# Patient Record
Sex: Male | Born: 1975 | Race: White | Hispanic: No | Marital: Married | Smoking: Never smoker
Health system: Southern US, Community
[De-identification: ages and names within clinical notes are randomized; demographics above are authoritative.]

## PROBLEM LIST (undated history)

## (undated) DIAGNOSIS — N200 Calculus of kidney: Secondary | ICD-10-CM

## (undated) DIAGNOSIS — E785 Hyperlipidemia, unspecified: Secondary | ICD-10-CM

## (undated) DIAGNOSIS — D239 Other benign neoplasm of skin, unspecified: Secondary | ICD-10-CM

## (undated) DIAGNOSIS — K8020 Calculus of gallbladder without cholecystitis without obstruction: Secondary | ICD-10-CM

## (undated) DIAGNOSIS — B019 Varicella without complication: Secondary | ICD-10-CM

## (undated) DIAGNOSIS — I10 Essential (primary) hypertension: Secondary | ICD-10-CM

## (undated) DIAGNOSIS — E119 Type 2 diabetes mellitus without complications: Secondary | ICD-10-CM

## (undated) HISTORY — DX: Essential (primary) hypertension: I10

## (undated) HISTORY — DX: Calculus of gallbladder without cholecystitis without obstruction: K80.20

## (undated) HISTORY — DX: Calculus of kidney: N20.0

## (undated) HISTORY — DX: Other benign neoplasm of skin, unspecified: D23.9

## (undated) HISTORY — DX: Varicella without complication: B01.9

## (undated) HISTORY — PX: NO PAST SURGERIES: SHX2092

## (undated) HISTORY — DX: Hyperlipidemia, unspecified: E78.5

---

## 2010-07-30 ENCOUNTER — Emergency Department: Payer: Self-pay | Admitting: Emergency Medicine

## 2010-08-06 ENCOUNTER — Encounter: Payer: Self-pay | Admitting: Cardiovascular Disease

## 2010-08-06 ENCOUNTER — Ambulatory Visit (INDEPENDENT_AMBULATORY_CARE_PROVIDER_SITE_OTHER): Payer: PRIVATE HEALTH INSURANCE | Admitting: Cardiovascular Disease

## 2010-08-06 VITALS — BP 156/99 | HR 79 | Ht 71.0 in | Wt 247.0 lb

## 2010-08-06 DIAGNOSIS — K219 Gastro-esophageal reflux disease without esophagitis: Secondary | ICD-10-CM

## 2010-08-06 DIAGNOSIS — I10 Essential (primary) hypertension: Secondary | ICD-10-CM

## 2010-08-06 NOTE — Assessment & Plan Note (Signed)
His symptoms are very consistent with gastroesophageal reflux. His pains definitely worsened when he ate greasy foods such as a sausage gravy or a sausage and a sandwich. He also eats lots of fatty foods. 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  He admits that getting up 5 pounds is going to be difficult.  I asked him to greatly diminished a greasy foods that he is eating. I've asked him to try olive oil.  I've also asked him to greatly cut down on his salt intake. I recommended that he start omeprazole or Prevacid which is available over-the-counter for his reflux. We'll see him again in several months.

## 2010-08-06 NOTE — Patient Instructions (Signed)
Try Prevacid or Prilosec ( omeprazole)   for acid reflux.  Avoid greasy foods. Avoid salt

## 2010-08-06 NOTE — Progress Notes (Signed)
Jerry Reid Date of Birth  Jun 04, 1975 [facility] [address]     [address] [place], [place]  [address] [phone]  Fax  [phone]  History of Present Illness:  Episode of chest pain recently - similar to heart burn.  Lasted for days.  Has tried Tums without any help.  Went to [facility]  ER this week.  Work up was normal.  Had palpitations.  Pains/ pressure has continued since that time.    No current outpatient prescriptions on file.   No Known Allergies  History reviewed. No pertinent past medical history.  History reviewed. No pertinent past surgical history.  History  Smoking status  . Never Smoker   Smokeless tobacco  . Not on file    History  Alcohol Use No    Family History  Problem Relation Age of Onset  . Heart attack Mother   . Heart disease Mother   . Hyperlipidemia Mother   . Hypertension Father   . Heart disease Father     Reviw of Systems:  Reviewed in the HPI.  All other systems are negative.  Physical Exam: BP 156/99  Pulse 79  Ht 5\' 11"  (1.803 m)  Wt 247 lb (112.038 kg)  BMI 34.45 kg/m2 The patient is alert and oriented x 3.  The mood and affect are normal.   Skin: warm and dry.  Color is normal.    HEENT:   the sclera are nonicteric.  The mucous membranes are moist.  The carotids are 2+ without bruits.  There is no thyromegaly.  There is no JVD.    Lungs: clear.  The chest wall is non tender.    Heart: regular rate with a normal S1 and S2.  There are no murmurs, gallops, or rubs. The PMI is not displaced.     Abdomin: good bowel sounds.  There is no guarding or rebound.  There is no hepatosplenomegaly or tenderness.  There are no masses.   Extremities:  no clubbing, cyanosis, or edema.  The legs are without rashes.  The distal pulses are intact.   Neuro:  Cranial nerves II - XII are intact.  Motor and sensory functions are intact.    The gait is normal.  ECG: Normal sinus rhythm. He has a  normal EKG.  Assessment / Plan:

## 2010-08-06 NOTE — Assessment & Plan Note (Signed)
I suspect that that is still eating a lot of salt. He eats a lot of fried foods. He goes out to eat several times a week. Avastin 2 cut down on the [employer] seeding. If his blood pressure remains elevated, we will start him on some low-dose HCTZ or other medications.  I'll see him again in 3 months for followup of his hypertension.

## 2010-08-12 ENCOUNTER — Encounter: Payer: Self-pay | Admitting: Cardiovascular Disease

## 2010-08-13 ENCOUNTER — Encounter: Payer: Self-pay | Admitting: Cardiovascular Disease

## 2010-11-13 ENCOUNTER — Ambulatory Visit: Payer: PRIVATE HEALTH INSURANCE | Admitting: Cardiovascular Disease

## 2015-05-01 ENCOUNTER — Ambulatory Visit: Payer: Self-pay | Admitting: Physician Assistant

## 2015-05-01 ENCOUNTER — Encounter: Payer: Self-pay | Admitting: Physician Assistant

## 2015-05-01 VITALS — BP 160/90 | HR 72 | Temp 98.5°F

## 2015-05-01 DIAGNOSIS — Z299 Encounter for prophylactic measures, unspecified: Secondary | ICD-10-CM

## 2015-05-01 DIAGNOSIS — R5383 Other fatigue: Secondary | ICD-10-CM

## 2015-05-01 DIAGNOSIS — R7309 Other abnormal glucose: Secondary | ICD-10-CM

## 2015-05-01 NOTE — Progress Notes (Signed)
S: was at a fair yesterday and had his glucose checked, had just eaten and his glucose was over 300, waited 2 hours and the lady checked it again and it was 220, has been feeling really tired, dragging, urinating a lot especially at night, seems to be thirsty at night, bp has been elevated, running around 140/90; has hx of borderline htn, low T, and prediabetes, no meds at this time, use to take clomid for low T; family hx of dm II, cardiovascular disease with both parents having MIs; father died in car accident at age 67 had hx of mi and dmII , mother died recently at 37 of viral pneumonia, had MI while in hospital, hx of dm, nonsmoker; his diet consists of 3 meals a day , mostly meat and veg/potatoes, 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

## 2015-05-02 ENCOUNTER — Encounter: Payer: Self-pay | Admitting: Emergency Medicine

## 2015-05-02 NOTE — Progress Notes (Signed)
Patient ID: Jerry Reid, male   DOB: [birth year], 40 y.o.   MRN: [id number] I spoke to the patient about his lab results and he expressed understanding.  Patient has been scheduled to see Dr. Jennette Kettle at [facility] on 05/06/15 at [place].  Pt has accepted the appointment.

## 2015-05-03 LAB — CMP12+LP+TP+TSH+6AC+PSA+CBC…
A/G RATIO: 2 (ref 1.2–2.2)
ALT: 38 IU/L (ref 0–44)
AST: 19 IU/L (ref 0–40)
Albumin: 4.6 g/dL (ref 3.5–5.5)
Alkaline Phosphatase: 57 IU/L (ref 39–117)
BASOS ABS: 0 10*3/uL (ref 0.0–0.2)
BILIRUBIN TOTAL: 1.4 mg/dL — AB (ref 0.0–1.2)
BUN/Creatinine Ratio: 12 (ref 9–20)
BUN: 9 mg/dL (ref 6–24)
Basos: 0 %
CHOL/HDL RATIO: 6.5 ratio — AB (ref 0.0–5.0)
CHOLESTEROL TOTAL: 182 mg/dL (ref 100–199)
Calcium: 9.4 mg/dL (ref 8.7–10.2)
Chloride: 97 mmol/L (ref 96–106)
Creatinine, Ser: 0.74 mg/dL — ABNORMAL LOW (ref 0.76–1.27)
EOS (ABSOLUTE): 0.1 10*3/uL (ref 0.0–0.4)
EOS: 1 %
Estimated CHD Risk: 1.4 times avg. — ABNORMAL HIGH (ref 0.0–1.0)
Free Thyroxine Index: 2.1 (ref 1.2–4.9)
GFR calc Af Amer: 132 mL/min/{1.73_m2} (ref >59)
GFR calc non Af Amer: 114 mL/min/{1.73_m2} (ref >59)
GGT: 38 IU/L (ref 0–65)
Globulin, Total: 2.3 g/dL (ref 1.5–4.5)
Glucose: 243 mg/dL — ABNORMAL HIGH (ref 65–99)
HDL: 28 mg/dL — AB (ref >39)
HEMATOCRIT: 45.6 % (ref 37.5–51.0)
HEMOGLOBIN: 16.1 g/dL (ref 12.6–17.7)
IMMATURE GRANS (ABS): 0 10*3/uL (ref 0.0–0.1)
IMMATURE GRANULOCYTES: 1 %
Iron: 109 ug/dL (ref 38–169)
LDH: 195 IU/L (ref 121–224)
LYMPHS ABS: 2.4 10*3/uL (ref 0.7–3.1)
Lymphs: 37 %
MCH: 29.7 pg (ref 26.6–33.0)
MCHC: 35.3 g/dL (ref 31.5–35.7)
MCV: 84 fL (ref 79–97)
MONOS ABS: 0.3 10*3/uL (ref 0.1–0.9)
Monocytes: 5 %
NEUTROS PCT: 56 %
Neutrophils Absolute: 3.7 10*3/uL (ref 1.4–7.0)
PLATELETS: 240 10*3/uL (ref 150–379)
PROSTATE SPECIFIC AG, SERUM: 0.5 ng/mL (ref 0.0–4.0)
Phosphorus: 2.5 mg/dL (ref 2.5–4.5)
Potassium: 4 mmol/L (ref 3.5–5.2)
RBC: 5.43 x10E6/uL (ref 4.14–5.80)
RDW: 14.5 % (ref 12.3–15.4)
Sodium: 136 mmol/L (ref 134–144)
T3 Uptake Ratio: 27 % (ref 24–39)
T4, Total: 7.8 ug/dL (ref 4.5–12.0)
TRIGLYCERIDES: 526 mg/dL — AB (ref 0–149)
TSH: 3.45 u[IU]/mL (ref 0.450–4.500)
Total Protein: 6.9 g/dL (ref 6.0–8.5)
Uric Acid: 4.9 mg/dL (ref 3.7–8.6)
WBC: 6.5 10*3/uL (ref 3.4–10.8)

## 2015-05-03 LAB — HEMOGLOBIN A1C
ESTIMATED AVERAGE GLUCOSE: 209 mg/dL
Hgb A1c MFr Bld: 8.9 % — ABNORMAL HIGH (ref 4.8–5.6)

## 2015-05-03 LAB — VITAMIN D 25 HYDROXY (VIT D DEFICIENCY, FRACTURES): VIT D 25 HYDROXY: 23.1 ng/mL — AB (ref 30.0–100.0)

## 2015-05-03 LAB — TESTOSTERONE,FREE AND TOTAL
Testosterone, Free: 13.4 pg/mL (ref 6.8–21.5)
Testosterone: 264 ng/dL — ABNORMAL LOW (ref 348–1197)

## 2015-05-06 ENCOUNTER — Telehealth: Payer: Self-pay | Admitting: Family Medicine

## 2015-05-06 ENCOUNTER — Ambulatory Visit (INDEPENDENT_AMBULATORY_CARE_PROVIDER_SITE_OTHER): Payer: Managed Care, Other (non HMO) | Admitting: Family Medicine

## 2015-05-06 ENCOUNTER — Encounter: Payer: Self-pay | Admitting: Family Medicine

## 2015-05-06 VITALS — BP 134/100 | HR 74 | Temp 98.1°F | Ht 70.8 in | Wt 239.4 lb

## 2015-05-06 DIAGNOSIS — E119 Type 2 diabetes mellitus without complications: Secondary | ICD-10-CM | POA: Diagnosis not present

## 2015-05-06 DIAGNOSIS — E291 Testicular hypofunction: Secondary | ICD-10-CM

## 2015-05-06 DIAGNOSIS — R7989 Other specified abnormal findings of blood chemistry: Secondary | ICD-10-CM

## 2015-05-06 DIAGNOSIS — R5383 Other fatigue: Secondary | ICD-10-CM

## 2015-05-06 DIAGNOSIS — E669 Obesity, unspecified: Secondary | ICD-10-CM

## 2015-05-06 DIAGNOSIS — E1165 Type 2 diabetes mellitus with hyperglycemia: Secondary | ICD-10-CM | POA: Insufficient documentation

## 2015-05-06 DIAGNOSIS — I10 Essential (primary) hypertension: Secondary | ICD-10-CM | POA: Diagnosis not present

## 2015-05-06 DIAGNOSIS — E785 Hyperlipidemia, unspecified: Secondary | ICD-10-CM

## 2015-05-06 MED ORDER — GLUCOSE BLOOD VI STRP: ORAL_STRIP | Status: DC

## 2015-05-06 MED ORDER — METFORMIN HCL 500 MG PO TABS: 500.0000 mg | ORAL_TABLET | Freq: Two times a day (BID) | ORAL | Status: DC

## 2015-05-06 MED ORDER — LISINOPRIL 10 MG PO TABS: 10.0000 mg | ORAL_TABLET | Freq: Every day | ORAL | Status: DC

## 2015-05-06 NOTE — Progress Notes (Signed)
Pre visit review using our clinic review tool, if applicable. No additional management support is needed unless otherwise documented below in the visit note. 

## 2015-05-06 NOTE — Telephone Encounter (Signed)
Test strips sent to pharmacy.

## 2015-05-06 NOTE — Telephone Encounter (Signed)
Sent in new prescription for one touch strips

## 2015-05-06 NOTE — Telephone Encounter (Signed)
Pt would like to add ACCU-CHEK Aviva Plus test strips to his ned list.. Thanks

## 2015-05-06 NOTE — Telephone Encounter (Signed)
The patient is needing One touch test strips called to the pharmacy . His insurance will cover those test strips.

## 2015-05-06 NOTE — Patient Instructions (Addendum)
Take the metformin as prescribed. After 1 week increase by 500 mg (total of 1500 mg daily). The following week you can increase to 2 tablets twice daily.  Take the lisinopril as prescribed.  We will call with the diabetes educator appt as well as the urology appt.  Take your blood sugar fasting daily. Keep a log.  Follow up in 1 month.  Take care  Dr. Lacinda Axon

## 2015-05-06 NOTE — Telephone Encounter (Signed)
Does he already have a meter.

## 2015-05-06 NOTE — Telephone Encounter (Signed)
Seen Dr. Lacinda Axon today. Sent to me by mistake

## 2015-05-07 NOTE — Progress Notes (Signed)
Subjective:  Patient ID: Jerry Reid, male    DOB: 07-25-1975  Age: 40 y.o. MRN: [id number]  CC: Establish care; recent diagnosis of DM  HPI Jerry Reid is a 40 y.o. male presents to the clinic today to establish care. Issues are below.  DM-2  Went to a health fair recently and was found to have an elevated blood sugar (was greater than 300).  Saw PA and [facility].  A1C was obtained and was elevated at 8.9.  He was then recommended to much for evaluation treatment.  He is not currently taking any medication.  He's been experiencing fatigue, increased thirst, and frequent urination particular at night.  He is here today to discuss treatment options regarding new diagnosis of diabetes.  HTN  Currently untreated.  Not at goal.  Will discuss today.  Hyperlipidemia  Uncontrolled the setting of diabetes.  Will discuss today.  Vitamin D deficiency  Recent Vitamin D = 23.1  No current treatment.  Low T  Patient has a history of low T that was treated with Clomid.  He is not currently on treatment at this time.  He and his wife are trying to conceive.  He would like to discuss treatment options today.  Fatigue  New problem.  Has no energy.  Wife does report that he snores.  Sleeps well but does not wake up feeling rested.  PMH, Surgical Hx, Family Hx, Social History reviewed and updated as below.  Past Medical History  Diagnosis Date  . Chest pain   . HTN (hypertension)   . Chicken pox   . Hyperlipidemia    Past Surgical History  Procedure Laterality Date  . No past surgeries     Family History  Problem Relation Age of Onset  . Heart attack Mother   . Heart disease Mother   . Hyperlipidemia Mother   . Hypertension Mother   . Diabetes Mother   . Hypertension Father   . Heart disease Father   . Hyperlipidemia Father   . Diabetes Father    Social History  Substance Use Topics  . Smoking status: Never Smoker   . Smokeless  tobacco: Not on file  . Alcohol Use: No    Review of Systems  Constitutional: Positive for fatigue.  Endocrine: Positive for polydipsia and polyuria.  All other systems reviewed and are negative.   Objective:   Today's Vitals: BP 134/100 mmHg  Pulse 74  Temp(Src) 98.1 F (36.7 C)  Ht 5' 10.8" (1.798 m)  Wt 239 lb 6.4 oz (108.591 kg)  BMI 33.59 kg/m2  Physical Exam  Constitutional: He is oriented to person, place, and time. He appears well-developed and well-nourished. No distress.  HENT:  Head: Normocephalic and atraumatic.  Nose: Nose normal.  Mouth/Throat: Oropharynx is clear and moist. No oropharyngeal exudate.  Normal TM's bilaterally.   Eyes: Conjunctivae are normal. No scleral icterus.  Neck: Neck supple. No thyromegaly present.  Cardiovascular: Normal rate and regular rhythm.   No murmur heard. Pulmonary/Chest: Effort normal and breath sounds normal. He has no wheezes. He has no rales.  Abdominal: Soft. He exhibits no distension. There is no tenderness. There is no rebound and no guarding.  Musculoskeletal: Normal range of motion. He exhibits no edema.  Lymphadenopathy:    He has no cervical adenopathy.  Neurological: He is alert and oriented to person, place, and time.  Skin: Skin is warm and dry. No rash noted.  Psychiatric: He has a normal mood and affect.  Vitals reviewed.  Assessment & Plan:   Problem List Items Addressed This Visit    HTN (hypertension)    Uncontrolled. Starting on Lisinopril.      Relevant Medications   lisinopril (PRINIVIL,ZESTRIL) 10 MG tablet   DM type 2 (diabetes mellitus, type 2) ([place]) - Primary    New diagnosis. Sending to Diabetes education. Starting on Metformin. Follow up in 1 month. Foot exam performed today. Needs eye exam later this year.       Relevant Medications   lisinopril (PRINIVIL,ZESTRIL) 10 MG tablet   metFORMIN (GLUCOPHAGE) 500 MG tablet   Other Relevant Orders   Ambulatory referral to diabetic  education   Obesity (BMI 30.0-34.9)    Encouraged dietary changes, exercise and weight loss.       Relevant Medications   metFORMIN (GLUCOPHAGE) 500 MG tablet   Hyperlipidemia    Recommended statin therapy given DM-2. Patient declined at this time.      Relevant Medications   lisinopril (PRINIVIL,ZESTRIL) 10 MG tablet   Low testosterone    Sending to urology as he desires children. Needs eval as he and his wife have not been able to conceive for >1 year.       Relevant Orders   Ambulatory referral to Urology   Fatigue    Likely multifactorial but I suspect OSA. Will continue to follow. Patient wanted to wait on sleep study.         Outpatient Encounter Prescriptions as of 05/06/2015  Medication Sig  . lisinopril (PRINIVIL,ZESTRIL) 10 MG tablet Take 1 tablet (10 mg total) by mouth daily.  . metFORMIN (GLUCOPHAGE) 500 MG tablet Take 1 tablet (500 mg total) by mouth 2 (two) times daily with a meal.  . [DISCONTINUED] glucose blood (COOL BLOOD GLUCOSE TEST STRIPS) test strip Use as instructed  . [DISCONTINUED] glucose blood (COOL BLOOD GLUCOSE TEST STRIPS) test strip Use as instructed   No facility-administered encounter medications on file as of 05/06/2015.    Follow-up: Return in about 1 month (around 06/05/2015).  [place]

## 2015-05-08 DIAGNOSIS — E1169 Type 2 diabetes mellitus with other specified complication: Secondary | ICD-10-CM | POA: Insufficient documentation

## 2015-05-08 DIAGNOSIS — R5383 Other fatigue: Secondary | ICD-10-CM | POA: Insufficient documentation

## 2015-05-08 DIAGNOSIS — R7989 Other specified abnormal findings of blood chemistry: Secondary | ICD-10-CM | POA: Insufficient documentation

## 2015-05-08 DIAGNOSIS — E785 Hyperlipidemia, unspecified: Secondary | ICD-10-CM | POA: Insufficient documentation

## 2015-05-08 NOTE — Assessment & Plan Note (Addendum)
Uncontrolled. Starting on Lisinopril.

## 2015-05-08 NOTE — Assessment & Plan Note (Signed)
Likely multifactorial but I suspect OSA. Will continue to follow. Patient wanted to wait on sleep study.

## 2015-05-08 NOTE — Assessment & Plan Note (Signed)
New diagnosis. Sending to Diabetes education. Starting on Metformin. Follow up in 1 month. Foot exam performed today. Needs eye exam later this year.

## 2015-05-08 NOTE — Assessment & Plan Note (Signed)
Encouraged dietary changes, exercise and weight loss.

## 2015-05-08 NOTE — Assessment & Plan Note (Signed)
Sending to urology as he desires children. Needs eval as he and his wife have not been able to conceive for >1 year.

## 2015-05-08 NOTE — Assessment & Plan Note (Signed)
Recommended statin therapy given DM-2. Patient declined at this time.

## 2015-05-24 ENCOUNTER — Telehealth: Payer: Self-pay | Admitting: Family Medicine

## 2015-05-24 DIAGNOSIS — E119 Type 2 diabetes mellitus without complications: Secondary | ICD-10-CM

## 2015-05-24 MED ORDER — METFORMIN HCL 500 MG PO TABS: 1000.0000 mg | ORAL_TABLET | Freq: Two times a day (BID) | ORAL | Status: DC

## 2015-05-24 NOTE — Telephone Encounter (Signed)
Spoke with patient. Patient advised that new rx has been called in to pharmacy. Patient is now taking 1000mg  BID. Patient will call with any other questions or concerns.

## 2015-05-24 NOTE — Telephone Encounter (Signed)
Pt wife called about pt needing a refill for metFORMIN (GLUCOPHAGE) 500 MG tablet pt did not get the 90 day Rx and pt ran out of medication. Pt only got 60 pills instead of 120. Pharmacy is [id number] - [place], [place] - [place]. Call pt @ [phone]. Thank you!

## 2015-06-05 ENCOUNTER — Telehealth: Payer: Self-pay

## 2015-06-05 ENCOUNTER — Ambulatory Visit (INDEPENDENT_AMBULATORY_CARE_PROVIDER_SITE_OTHER): Payer: Managed Care, Other (non HMO) | Admitting: Family Medicine

## 2015-06-05 VITALS — BP 126/78 | HR 77 | Temp 97.9°F | Ht 71.0 in | Wt 227.4 lb

## 2015-06-05 DIAGNOSIS — E119 Type 2 diabetes mellitus without complications: Secondary | ICD-10-CM | POA: Diagnosis not present

## 2015-06-05 DIAGNOSIS — I10 Essential (primary) hypertension: Secondary | ICD-10-CM

## 2015-06-05 DIAGNOSIS — E785 Hyperlipidemia, unspecified: Secondary | ICD-10-CM | POA: Diagnosis not present

## 2015-06-05 MED ORDER — METFORMIN HCL ER (MOD) 1000 MG PO TB24: 2000.0000 mg | ORAL_TABLET | Freq: Every day | ORAL | Status: DC

## 2015-06-05 NOTE — Assessment & Plan Note (Addendum)
Improving with metformin but having significant GI upset/diarrhea. Switching to ER form to see if he can tolerate better. If not, will switch to SGLT2 (if he can afford). Advised to get eye exam.

## 2015-06-05 NOTE — Telephone Encounter (Signed)
Pa for Metformin ER completed on cover my meds.  Approved.

## 2015-06-05 NOTE — Progress Notes (Signed)
Pre visit review using our clinic review tool, if applicable. No additional management support is needed unless otherwise documented below in the visit note. 

## 2015-06-05 NOTE — Assessment & Plan Note (Signed)
Well controlled/at goal. Continue Lisinopril.

## 2015-06-05 NOTE — Progress Notes (Signed)
Subjective:  Patient ID: Jerry Reid, male    DOB: 1975-10-25  Age: 40 y.o. MRN: [id number]  CC: Follow up DM, HTN, HLD  HPI:  40 year old male with hypertension, hyperlipidemia, DM 2, low T presents for follow-up.  DM  Blood sugars readings - [place] predominantly in the 130's.   Hypoglycemia - No.  Medications - Metformin (total 2000 mg daily).   Adverse effects - GI upset/diarrhea.  Compliance - yes. Preventative care  Eye exam - In need of.  Foot exam - Up to date.  Last A1C - 8.9, 05/01/15.  Urine microalbumin - Already on ACEI  Candidate for aspirin - Not at this time per USPSTF guidelines.  Candidate for statin - Yes.   HTN  Well controlled on lisinopril.  No adverse side effects. Tolerating without difficulty.  HLD  Uncontrolled.  Will revisit Statin therapy at this time.  Social Hx   Social History   Social History  . Marital Status: Single    Spouse Name: N/A  . Number of Children: N/A  . Years of Education: N/A   Social History Main Topics  . Smoking status: Never Smoker   . Smokeless tobacco: Not on file  . Alcohol Use: No  . Drug Use: No  . Sexual Activity: Not on file   Other Topics Concern  . Not on file   Social History Narrative   Review of Systems  Constitutional: Negative.   Respiratory: Negative.   Cardiovascular: Negative.    Objective:  BP 126/78 mmHg  Pulse 77  Temp(Src) 97.9 F (36.6 C) (Oral)  Ht 5\' 11"  (1.803 m)  Wt 227 lb 6.4 oz (103.148 kg)  BMI 31.73 kg/m2  SpO2 95%  BP/Weight 06/05/2015 05/06/2015 [id number]  Systolic BP 123XX123 Q000111Q 0000000  Diastolic BP 78 123XX123 90  Wt. (Lbs) 227.4 239.4 -  BMI 31.73 33.59 -   Physical Exam  Constitutional: He is oriented to person, place, and time. He appears well-developed. No distress.  Cardiovascular: Normal rate and regular rhythm.   Pulmonary/Chest: Effort normal and breath sounds normal.  Neurological: He is alert and oriented to person, place, and time.    Psychiatric: He has a normal mood and affect.  Vitals reviewed.  Lab Results  Component Value Date   WBC 6.5 05/01/2015   HCT 45.6 05/01/2015   PLT 240 05/01/2015   GLUCOSE 243* 05/01/2015   CHOL 182 05/01/2015   TRIG 526* 05/01/2015   HDL 28* 05/01/2015   LDLCALC Comment 05/01/2015   ALT 38 05/01/2015   AST 19 05/01/2015   NA 136 05/01/2015   K 4.0 05/01/2015   CL 97 05/01/2015   CREATININE 0.74* 05/01/2015   BUN 9 05/01/2015   TSH 3.450 05/01/2015   HGBA1C 8.9* 05/01/2015   Assessment & Plan:   Problem List Items Addressed This Visit    Hyperlipidemia    Uncontrolled. In need of statin. Would like to continue to diet/exercise for weight loss. Declines statin today.      HTN (hypertension)    Well controlled/at goal. Continue Lisinopril.       DM type 2 (diabetes mellitus, type 2) ([place]) - Primary    Improving with metformin but having significant GI upset/diarrhea. Switching to ER form to see if he can tolerate better. If not, will switch to SGLT2 (if he can afford). Advised to get eye exam.      Relevant Medications   metFORMIN (GLUMETZA) 1000 MG (MOD) 24 hr tablet  Meds ordered this encounter  Medications  . metFORMIN (GLUMETZA) 1000 MG (MOD) 24 hr tablet    Sig: Take 2 tablets (2,000 mg total) by mouth daily with breakfast.    Dispense:  180 tablet    Refill:  1    Follow-up: Return in about 3 months (around 09/05/2015) for Diabetes follow up.  [place]

## 2015-06-05 NOTE — Assessment & Plan Note (Signed)
Uncontrolled. In need of statin. Would like to continue to diet/exercise for weight loss. Declines statin today.

## 2015-06-05 NOTE — Patient Instructions (Signed)
It was nice to see you today.  Switch to the extended release metformin.  Follow up in 3 months.  Take care  Dr. Lacinda Axon

## 2015-06-14 NOTE — Telephone Encounter (Signed)
PA was approved for the Metformin ER 1000mg , Not for osmotic release, Glumzeta, does the patient need that one?  The pharmacy sent a generic request when it came so it was not completed with that specific drug, please advise?

## 2015-06-14 NOTE — Telephone Encounter (Signed)
Spoke with the pharmacist and he will make sure its the Metformin ER. Thanks.

## 2015-06-14 NOTE — Telephone Encounter (Signed)
No just the ER Metformin.

## 2015-09-05 ENCOUNTER — Ambulatory Visit (INDEPENDENT_AMBULATORY_CARE_PROVIDER_SITE_OTHER): Payer: Managed Care, Other (non HMO) | Admitting: Family Medicine

## 2015-09-05 ENCOUNTER — Encounter: Payer: Self-pay | Admitting: Family Medicine

## 2015-09-05 ENCOUNTER — Encounter (INDEPENDENT_AMBULATORY_CARE_PROVIDER_SITE_OTHER): Payer: Self-pay

## 2015-09-05 VITALS — BP 129/86 | HR 84 | Temp 98.4°F | Wt 222.1 lb

## 2015-09-05 DIAGNOSIS — I10 Essential (primary) hypertension: Secondary | ICD-10-CM | POA: Diagnosis not present

## 2015-09-05 DIAGNOSIS — E119 Type 2 diabetes mellitus without complications: Secondary | ICD-10-CM

## 2015-09-05 DIAGNOSIS — E785 Hyperlipidemia, unspecified: Secondary | ICD-10-CM | POA: Diagnosis not present

## 2015-09-05 MED ORDER — EMPAGLIFLOZIN 10 MG PO TABS: 10.0000 mg | ORAL_TABLET | Freq: Every day | ORAL | 3 refills | Status: DC

## 2015-09-05 NOTE — Progress Notes (Signed)
Subjective:  Patient ID: Jerry Reid, male    DOB: 1975-11-26  Age: 40 y.o. MRN: [id number]  CC: Follow up   HPI:  40 year old male with uncontrolled DM 2, hypertension, hyperlipidemia presents for follow-up.  DM - 2  Uncontrolled.   Blood sugars readings - 130's to 170's.  Hypoglycemia - No.   Medications - Metformin ER.   Adverse effects - Diarrhea (passing whole pills).  Compliance - Yes.   HTN  Well controlled on Lisinopril.   HLD  Uncontrolled.  Needs repeat labs today.  Patient previously declined statin.  Will discuss today.  Social Hx   Social History   Social History  . Marital status: Single    Spouse name: N/A  . Number of children: N/A  . Years of education: N/A   Social History Main Topics  . Smoking status: Never Smoker  . Smokeless tobacco: None  . Alcohol use No  . Drug use: No  . Sexual activity: Not Asked   Other Topics Concern  . None   Social History Narrative  . None   Review of Systems  Constitutional: Positive for fatigue.  Gastrointestinal: Positive for diarrhea.  Genitourinary:       Erectile dysfunction.   Objective:  BP 129/86 (BP Location: Right Arm, Patient Position: Sitting, Cuff Size: Normal)   Pulse 84   Temp 98.4 F (36.9 C) (Oral)   Wt 222 lb 2 oz (100.8 kg)   SpO2 96%   BMI 30.98 kg/m   BP/Weight 09/05/2015 06/05/2015 [id number]  Systolic BP Q000111Q 123XX123 Q000111Q  Diastolic BP 86 78 123XX123  Wt. (Lbs) 222.13 227.4 239.4  BMI 30.98 31.73 33.59   Physical Exam  Constitutional: He is oriented to person, place, and time. He appears well-developed. No distress.  Cardiovascular: Normal rate and regular rhythm.   Pulmonary/Chest: Effort normal. He has no wheezes. He has no rales.  Neurological: He is alert and oriented to person, place, and time.  Psychiatric: He has a normal mood and affect.  Vitals reviewed.  Lab Results  Component Value Date   WBC 6.5 05/01/2015   HCT 45.6 05/01/2015   PLT 240 05/01/2015   GLUCOSE 243 (H) 05/01/2015   CHOL 182 05/01/2015   TRIG 526 (H) 05/01/2015   HDL 28 (L) 05/01/2015   [place] Comment 05/01/2015   ALT 38 05/01/2015   AST 19 05/01/2015   NA 136 05/01/2015   K 4.0 05/01/2015   CL 97 05/01/2015   CREATININE 0.74 (L) 05/01/2015   BUN 9 05/01/2015   TSH 3.450 05/01/2015   HGBA1C 8.9 (H) 05/01/2015    Assessment & Plan:   Problem List Items Addressed This Visit    DM type 2 (diabetes mellitus, type 2) ([place])    Not at goal. Stopping Metformin due to side effects (even despite ER form). Starting Jardiance. A1C ordered (patient to get in near future; wants to get at health clinic as he gets it for free).      Relevant Medications   empagliflozin (JARDIANCE) 10 MG TABS tablet   Other Relevant Orders   HgB A1c   HTN (hypertension)    At goal. Continue Lisinopril.      Hyperlipidemia    Uncontrolled. Again advised statin. Patient would like to repeat labs and see. Lipid panel ordered.       Relevant Orders   Lipid Profile    Other Visit Diagnoses   None.     Meds ordered this encounter  Medications  .  empagliflozin (JARDIANCE) 10 MG TABS tablet    Sig: Take 10 mg by mouth daily.    Dispense:  90 tablet    Refill:  3    Follow-up: 3 months  [place] DO [facility]

## 2015-09-05 NOTE — Progress Notes (Signed)
Pre visit review using our clinic review tool, if applicable. No additional management support is needed unless otherwise documented below in the visit note. 

## 2015-09-05 NOTE — Assessment & Plan Note (Signed)
At goal. Continue Lisinopril.  

## 2015-09-05 NOTE — Patient Instructions (Addendum)
Start the [place] daily. Call if you have trouble affording.  Follow up in 3 months.  Take care  Dr. Lacinda Axon

## 2015-09-05 NOTE — Assessment & Plan Note (Signed)
Uncontrolled. Again advised statin. Patient would like to repeat labs and see. Lipid panel ordered.

## 2015-09-05 NOTE — Assessment & Plan Note (Signed)
Not at goal. Stopping Metformin due to side effects (even despite ER form). Starting Jardiance. A1C ordered (patient to get in near future; wants to get at health clinic as he gets it for free).

## 2015-09-26 ENCOUNTER — Emergency Department: Payer: Managed Care, Other (non HMO)

## 2015-09-26 ENCOUNTER — Emergency Department
Admission: EM | Admit: 2015-09-26 | Discharge: 2015-09-26 | Disposition: A | Discharge status: Home / Self Care | Payer: Managed Care, Other (non HMO) | Attending: Emergency Medicine | Admitting: Emergency Medicine

## 2015-09-26 DIAGNOSIS — N200 Calculus of kidney: Secondary | ICD-10-CM | POA: Diagnosis not present

## 2015-09-26 DIAGNOSIS — I10 Essential (primary) hypertension: Secondary | ICD-10-CM | POA: Insufficient documentation

## 2015-09-26 DIAGNOSIS — E119 Type 2 diabetes mellitus without complications: Secondary | ICD-10-CM | POA: Insufficient documentation

## 2015-09-26 DIAGNOSIS — K8020 Calculus of gallbladder without cholecystitis without obstruction: Secondary | ICD-10-CM

## 2015-09-26 DIAGNOSIS — Z7984 Long term (current) use of oral hypoglycemic drugs: Secondary | ICD-10-CM | POA: Diagnosis not present

## 2015-09-26 DIAGNOSIS — R1030 Lower abdominal pain, unspecified: Secondary | ICD-10-CM | POA: Diagnosis present

## 2015-09-26 HISTORY — DX: Type 2 diabetes mellitus without complications: E11.9

## 2015-09-26 LAB — COMPREHENSIVE METABOLIC PANEL
ALBUMIN: 5.1 g/dL — AB (ref 3.5–5.0)
ALT: 34 U/L (ref 17–63)
ANION GAP: 7 (ref 5–15)
AST: 31 U/L (ref 15–41)
Alkaline Phosphatase: 39 U/L (ref 38–126)
BUN: 16 mg/dL (ref 6–20)
CALCIUM: 9.7 mg/dL (ref 8.9–10.3)
CO2: 25 mmol/L (ref 22–32)
Chloride: 105 mmol/L (ref 101–111)
Creatinine, Ser: 1.09 mg/dL (ref 0.61–1.24)
GFR calc non Af Amer: >60 mL/min (ref >60)
GLUCOSE: 167 mg/dL — AB (ref 65–99)
POTASSIUM: 3.8 mmol/L (ref 3.5–5.1)
Sodium: 137 mmol/L (ref 135–145)
Total Bilirubin: 1.6 mg/dL — ABNORMAL HIGH (ref 0.3–1.2)
Total Protein: 8.1 g/dL (ref 6.5–8.1)

## 2015-09-26 LAB — URINALYSIS COMPLETE WITH MICROSCOPIC (ARMC ONLY)
BACTERIA UA: NONE SEEN
BILIRUBIN URINE: NEGATIVE
Glucose, UA: >500 mg/dL — AB
HGB URINE DIPSTICK: NEGATIVE
LEUKOCYTES UA: NEGATIVE
NITRITE: NEGATIVE
PH: 7 (ref 5.0–8.0)
PROTEIN: NEGATIVE mg/dL
Specific Gravity, Urine: 1.022 (ref 1.005–1.030)

## 2015-09-26 LAB — CBC
HCT: 44.7 % (ref 40.0–52.0)
HEMOGLOBIN: 16.2 g/dL (ref 13.0–18.0)
MCH: 30.4 pg (ref 26.0–34.0)
MCHC: 36.3 g/dL — AB (ref 32.0–36.0)
MCV: 83.5 fL (ref 80.0–100.0)
PLATELETS: 252 10*3/uL (ref 150–440)
RBC: 5.35 MIL/uL (ref 4.40–5.90)
RDW: 13.8 % (ref 11.5–14.5)
WBC: 8.3 10*3/uL (ref 3.8–10.6)

## 2015-09-26 MED ORDER — KETOROLAC TROMETHAMINE 30 MG/ML IJ SOLN
30.0000 mg | Freq: Once | INTRAMUSCULAR | Status: AC
Start: 2015-09-26 — End: 2015-09-26
  Administered 2015-09-26: 30 mg via INTRAVENOUS
  Filled 2015-09-26: qty 1

## 2015-09-26 MED ORDER — SODIUM CHLORIDE 0.9 % IV BOLUS (SEPSIS)
1000.0000 mL | Freq: Once | INTRAVENOUS | Status: AC
  Administered 2015-09-26: 1000 mL via INTRAVENOUS

## 2015-09-26 MED ORDER — ONDANSETRON HCL 4 MG/2ML IJ SOLN
4.0000 mg | Freq: Once | INTRAMUSCULAR | Status: AC
  Administered 2015-09-26: 4 mg via INTRAVENOUS

## 2015-09-26 MED ORDER — ONDANSETRON 4 MG PO TBDP: 4.0000 mg | ORAL_TABLET | Freq: Three times a day (TID) | ORAL | 0 refills | Status: DC | PRN

## 2015-09-26 MED ORDER — ONDANSETRON HCL 4 MG/2ML IJ SOLN: 4.0000 mg | Freq: Once | INTRAMUSCULAR | Status: DC

## 2015-09-26 MED ORDER — MORPHINE SULFATE (PF) 4 MG/ML IV SOLN
4.0000 mg | Freq: Once | INTRAVENOUS | Status: AC
  Administered 2015-09-26: 4 mg via INTRAVENOUS

## 2015-09-26 MED ORDER — SODIUM CHLORIDE 0.9 % IV BOLUS (SEPSIS): 1000.0000 mL | Freq: Once | INTRAVENOUS | Status: DC

## 2015-09-26 MED ORDER — OXYCODONE-ACETAMINOPHEN 5-325 MG PO TABS: 1.0000 | ORAL_TABLET | ORAL | 0 refills | Status: DC | PRN

## 2015-09-26 MED ORDER — MORPHINE SULFATE (PF) 4 MG/ML IV SOLN: 4.0000 mg | Freq: Once | INTRAVENOUS | Status: DC

## 2015-09-26 MED ORDER — ONDANSETRON HCL 4 MG/2ML IJ SOLN
INTRAMUSCULAR | Status: AC
  Filled 2015-09-26: qty 2

## 2015-09-26 MED ORDER — MORPHINE SULFATE (PF) 4 MG/ML IV SOLN
INTRAVENOUS | Status: AC
  Filled 2015-09-26: qty 1

## 2015-09-26 MED ORDER — HYDROMORPHONE HCL 1 MG/ML IJ SOLN
1.0000 mg | Freq: Once | INTRAMUSCULAR | Status: AC
  Administered 2015-09-26: 1 mg via INTRAVENOUS
  Filled 2015-09-26: qty 1

## 2015-09-26 MED ORDER — TAMSULOSIN HCL 0.4 MG PO CAPS: 0.4000 mg | ORAL_CAPSULE | Freq: Every day | ORAL | 0 refills | Status: AC

## 2015-09-26 MED ORDER — TAMSULOSIN HCL 0.4 MG PO CAPS
0.4000 mg | ORAL_CAPSULE | Freq: Once | ORAL | Status: AC
  Administered 2015-09-26: 0.4 mg via ORAL
  Filled 2015-09-26: qty 1

## 2015-09-26 NOTE — ED Notes (Signed)
Pt. Going home with wife. 

## 2015-09-26 NOTE — ED Notes (Signed)
Pt transported to CT via stretcher.  

## 2015-09-26 NOTE — ED Provider Notes (Signed)
[facility] Emergency Department Provider Note    First MD Initiated Contact with Patient 09/26/15 0200     (approximate)  I have reviewed the triage vital signs and the nursing notes.   HISTORY  Chief Complaint Groin Pain    HPI Jerry Reid is a 40 y.o. male presents with acute onset of 10 out of 10 left groin/left testicular pain associated with nausea and vomiting with onset at 1 AM this morning. Patient denies any fever afebrile on presentation temperature 97.7. Patient states that the pain is worse in his scrotum at this time. Patient denies any hematuria no dysuria. Patient denies any history of kidney stones.   Past Medical History:  Diagnosis Date  . Chest pain   . Chicken pox   . Diabetes mellitus without complication ([place])   . HTN (hypertension)   . Hyperlipidemia     Patient Active Problem List   Diagnosis Date Noted  . Hyperlipidemia 05/08/2015  . Low testosterone 05/08/2015  . DM type 2 (diabetes mellitus, type 2) ([place]) 05/06/2015  . Obesity (BMI 30.0-34.9) 05/06/2015  . GERD (gastroesophageal reflux disease) 08/06/2010  . HTN (hypertension) 08/06/2010    Past Surgical History:  Procedure Laterality Date  . NO PAST SURGERIES      Prior to Admission medications   Medication Sig Start Date End Date Taking? Authorizing Provider  empagliflozin (JARDIANCE) 10 MG TABS tablet Take 10 mg by mouth daily. 09/05/15   Coral Spikes, DO  glucose blood (COOL BLOOD GLUCOSE TEST STRIPS) test strip Use as instructed to check blood glucose up to three times a day, E11.29. Please dispense one touch strips per his insurance. Thanks 05/06/15   Coral Spikes, DO  lisinopril (PRINIVIL,ZESTRIL) 10 MG tablet Take 1 tablet (10 mg total) by mouth daily. 05/06/15   Jayce G Cook, DO  ondansetron (ZOFRAN ODT) 4 MG disintegrating tablet Take 1 tablet (4 mg total) by mouth every 8 (eight) hours as needed for nausea or vomiting. 09/26/15   Gregor Hams, MD    oxyCODONE-acetaminophen (ROXICET) 5-325 MG tablet Take 1 tablet by mouth every 4 (four) hours as needed for severe pain. 09/26/15   Gregor Hams, MD  tamsulosin [facility]) 0.4 MG CAPS capsule Take 1 capsule (0.4 mg total) by mouth daily after breakfast. 09/26/15 10/03/15  Gregor Hams, MD    Allergies No known drug allergies  Family History  Problem Relation Age of Onset  . Heart attack Mother   . Heart disease Mother   . Hyperlipidemia Mother   . Hypertension Mother   . Diabetes Mother   . Hypertension Father   . Heart disease Father   . Hyperlipidemia Father   . Diabetes Father     Social History Social History  Substance Use Topics  . Smoking status: Never Smoker  . Smokeless tobacco: Not on file  . Alcohol use No    Review of Systems Constitutional: No fever/chills Eyes: No visual changes. ENT: No sore throat. Cardiovascular: Denies chest pain. Respiratory: Denies shortness of breath. Gastrointestinal:Positive for left flank pain and vomiting. Genitourinary: Negative for dysuria. Positive for left testicular pain Musculoskeletal: Negative for back pain. Skin: Negative for rash. Neurological: Negative for headaches, focal weakness or numbness.  10-point ROS otherwise negative.  ____________________________________________   PHYSICAL EXAM:  VITAL SIGNS: ED Triage Vitals  Enc Vitals Group     BP 09/26/15 0205 (!) 156/108     Pulse Rate 09/26/15 0205 76  Resp 09/26/15 0205 (!) 24     Temp 09/26/15 0205 97.7 F (36.5 C)     Temp Source 09/26/15 0205 Oral     SpO2 09/26/15 0205 100 %     Weight --      Height --      Head Circumference --      Peak Flow --      Pain Score 09/26/15 0206 10     Pain Loc --      Pain Edu? --      Excl. in [place]? --     Constitutional: Alert and oriented. Apparent discomfort Eyes: Conjunctivae are normal. PERRL. EOMI. Head: Atraumatic. Mouth/Throat: Mucous membranes are moist.  Oropharynx non-erythematous. Neck: No  stridor.  No meningeal signs.   Cardiovascular: Normal rate, regular rhythm. Good peripheral circulation. Grossly normal heart sounds. Respiratory: Normal respiratory effort.  No retractions. Lungs CTAB. Gastrointestinal: Soft and nontender. No distention.  Genitourinary: No gross abnormality noted on scrotal exam no pain with palpation of the left testicle Musculoskeletal: No lower extremity tenderness nor edema. No gross deformities of extremities. Neurologic:  Normal speech and language. No gross focal neurologic deficits are appreciated.  Skin:  Skin is warm, dry and intact. No rash noted. Psychiatric: Mood and affect are normal. Speech and behavior are normal.  ____________________________________________   LABS (all labs ordered are listed, but only abnormal results are displayed)  Labs Reviewed  CBC - Abnormal; Notable for the following:       Result Value   MCHC 36.3 (*)    All other components within normal limits  COMPREHENSIVE METABOLIC PANEL - Abnormal; Notable for the following:    Glucose, Bld 167 (*)    Albumin 5.1 (*)    Total Bilirubin 1.6 (*)    All other components within normal limits  URINALYSIS COMPLETEWITH MICROSCOPIC ([facility] ONLY) - Abnormal; Notable for the following:    Color, Urine STRAW (*)    APPearance CLEAR (*)    Glucose, UA >500 (*)    Ketones, ur TRACE (*)    Squamous Epithelial / LPF 0-5 (*)    All other components within normal limits     RADIOLOGY I, [place] Ailsa Mireles, personally viewed and evaluated these images (plain radiographs) as part of my medical decision making, as well as reviewing the written report by the radiologist.  US Scrotum  Result Date: 09/26/2015 CLINICAL DATA:  Acute onset of left testicular pain and vomiting. Initial encounter. EXAM: ULTRASOUND OF SCROTUM TECHNIQUE: Complete ultrasound examination of the testicles, epididymis, and other scrotal structures was performed. COMPARISON:  None. FINDINGS: Right testicle  Measurements: 4.0 x 2.3 x 2.7 cm. No mass or microlithiasis visualized. Left testicle Measurements: 4.2 x 2.4 x 2.7 cm. No mass or microlithiasis visualized. Right epididymis:  Normal in size and appearance. Left epididymis:  Normal in size and appearance. Hydrocele:  Trace bilateral hydroceles remain within normal limits. Varicocele:  None visualized. IMPRESSION: No evidence of testicular torsion. Testes unremarkable in appearance. Electronically Signed   By: Garald Balding M.D.   On: 09/26/2015 02:56   Ct Renal Stone Study  Result Date: 09/26/2015 CLINICAL DATA:  Left groin pain.  Nausea and vomiting. EXAM: CT ABDOMEN AND PELVIS WITHOUT CONTRAST TECHNIQUE: Multidetector CT imaging of the abdomen and pelvis was performed following the standard protocol without IV contrast. COMPARISON:  Pelvic ultrasound 09/26/2015 FINDINGS: Lower chest: No pulmonary nodules or pleural effusion. No visible pericardial effusion. Hepatobiliary: There is cholelithiasis without evidence of acute cholecystitis.  Hepatic size and contours are normal. Normal noncontrast appearance of the liver. No perihepatic ascites. Pancreas: Normal noncontrast appearance of the pancreas. No peripancreatic fluid collection. Spleen: Normal. Adrenal glands: Normal. Kidneys: There is a 3 mm stone within the distal left ureter (series 2, image 85). There is mild proximal ureteral dilatation with mild periureteral and perinephric stranding. Within the right kidney, there is a 2 mm nonobstructing calculus. The left renal pelvis is mildly dilated but there is no hydronephrosis. Stomach/Bowel: No dilated loops of bowel. No evidence of colonic or enteric inflammation. No fluid collection within the abdomen. Vascular/Lymphatic: No abdominal aortic aneurysm or atherosclerotic calcification. No abdominal or pelvic lymphadenopathy. Reproductive: Normal prostate size. Seminal vesicles are normal. No free fluid in the pelvis. Musculoskeletal. No lytic or blastic  lesions. No advanced bony spinal canal stenosis. There are large right lateral osteophytes at L1-2 and L2-3. The visualized extraperitoneal and extrathoracic soft tissues are normal. IMPRESSION: 1. 3 mm calculus within the distal left ureter with mild proximal ureteral dilatation and periureteral and perinephric stranding. No hydronephrosis. 2. Nonobstructing 2 mm calculus within the midportion of the right kidney. 3. Cholelithiasis without evidence of acute cholecystitis. Electronically Signed   By: Ulyses Jarred M.D.   On: 09/26/2015 03:22     Procedures    INITIAL IMPRESSION / ASSESSMENT AND PLAN / ED COURSE  Pertinent labs & imaging results that were available during my care of the patient were reviewed by me and considered in my medical decision making (see chart for details).  History of physical exam consistent with possible left ureterolithiasis versus left testicular torsion however kidney stone more likely. Ultrasound revealed no gross abnormality of the left testicle. CT scan of the abdomen and pelvis renal protocol revealed a 3 mm distal left ureteral stone. In addition to CT scan revealed a 2 mm right kidney stone as well as cholelithiasis. Patient was informed of all of these findings and referred to Dr. Erlene Quan urologist and Dr. Burt Knack general surgeon. Pain control was achieved following IV Toradol given in the emergency department.   Clinical Course    ____________________________________________  FINAL CLINICAL IMPRESSION(S) / ED DIAGNOSES  Final diagnoses:  Kidney stone on left side  Gallstones     MEDICATIONS GIVEN DURING THIS VISIT:  Medications  morphine 4 MG/ML injection 4 mg ( Intravenous Not Given 09/26/15 0212)  ondansetron (ZOFRAN) injection 4 mg ( Intravenous Not Given 09/26/15 0212)  sodium chloride 0.9 % bolus 1,000 mL (1,000 mLs Intravenous Not Given 09/26/15 0212)  sodium chloride 0.9 % bolus 1,000 mL (0 mLs Intravenous Stopped 09/26/15 0448)  morphine 4  MG/ML injection 4 mg (4 mg Intravenous Given 09/26/15 0204)  ondansetron (ZOFRAN) injection 4 mg (4 mg Intravenous Given 09/26/15 0204)  HYDROmorphone (DILAUDID) injection 1 mg (1 mg Intravenous Given 09/26/15 0250)  sodium chloride 0.9 % bolus 1,000 mL (1,000 mLs Intravenous New Bag/Given 09/26/15 0250)  ketorolac (TORADOL) 30 MG/ML injection 30 mg (30 mg Intravenous Given 09/26/15 0444)  tamsulosin (FLOMAX) capsule 0.4 mg (0.4 mg Oral Given 09/26/15 0444)     NEW OUTPATIENT MEDICATIONS STARTED DURING THIS VISIT:  New Prescriptions   ONDANSETRON (ZOFRAN ODT) 4 MG DISINTEGRATING TABLET    Take 1 tablet (4 mg total) by mouth every 8 (eight) hours as needed for nausea or vomiting.   OXYCODONE-ACETAMINOPHEN (ROXICET) 5-325 MG TABLET    Take 1 tablet by mouth every 4 (four) hours as needed for severe pain.   TAMSULOSIN (FLOMAX) 0.4 MG CAPS CAPSULE  Take 1 capsule (0.4 mg total) by mouth daily after breakfast.    Modified Medications   No medications on file    Discontinued Medications   No medications on file     Note:  This document was prepared using Dragon voice recognition software and may include unintentional dictation errors.    Gregor Hams, MD 09/26/15 [phone]

## 2015-09-26 NOTE — ED Notes (Signed)
Pt. Returned from CT.

## 2015-09-26 NOTE — ED Triage Notes (Signed)
Pt presents to ED for L groin pain and N&V since 1am. 2 episodes of emesis. States woke up with L groin/testicle pain.

## 2015-09-26 NOTE — ED Notes (Signed)
Pt transferred to US via stretcher.

## 2015-09-26 NOTE — ED Notes (Signed)
Pt made aware of need for urine sample.  

## 2015-11-18 ENCOUNTER — Telehealth: Payer: Self-pay | Admitting: *Deleted

## 2015-11-18 NOTE — Telephone Encounter (Signed)
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

## 2015-11-18 NOTE — Telephone Encounter (Signed)
Last office visit in 5/017/17.

## 2015-11-19 ENCOUNTER — Other Ambulatory Visit: Payer: Self-pay | Admitting: Family Medicine

## 2015-11-19 DIAGNOSIS — E119 Type 2 diabetes mellitus without complications: Secondary | ICD-10-CM

## 2015-11-19 DIAGNOSIS — E785 Hyperlipidemia, unspecified: Secondary | ICD-10-CM

## 2015-11-19 NOTE — Telephone Encounter (Signed)
Orders placed. He can go get labs drawn.

## 2015-11-19 NOTE — Telephone Encounter (Signed)
These orders have been faxed.

## 2015-12-02 ENCOUNTER — Other Ambulatory Visit: Payer: Self-pay

## 2015-12-02 DIAGNOSIS — Z299 Encounter for prophylactic measures, unspecified: Secondary | ICD-10-CM

## 2015-12-02 NOTE — Progress Notes (Signed)
Patient came in to have blood drawn for testing per Dr. Ulice Dash Cook's orders.

## 2015-12-03 LAB — LIPID PANEL
CHOL/HDL RATIO: 5.9 ratio — AB (ref 0.0–5.0)
CHOLESTEROL TOTAL: 183 mg/dL (ref 100–199)
HDL: 31 mg/dL — ABNORMAL LOW (ref >39)
LDL CALC: 99 mg/dL (ref 0–99)
Triglycerides: 267 mg/dL — ABNORMAL HIGH (ref 0–149)
VLDL CHOLESTEROL CAL: 53 mg/dL — AB (ref 5–40)

## 2015-12-03 LAB — HGB A1C W/O EAG: Hgb A1c MFr Bld: 6 % — ABNORMAL HIGH (ref 4.8–5.6)

## 2015-12-06 ENCOUNTER — Ambulatory Visit (INDEPENDENT_AMBULATORY_CARE_PROVIDER_SITE_OTHER): Payer: Managed Care, Other (non HMO) | Admitting: Family Medicine

## 2015-12-06 ENCOUNTER — Encounter: Payer: Self-pay | Admitting: Family Medicine

## 2015-12-06 VITALS — BP 116/77 | HR 73 | Temp 98.3°F | Resp 14 | Wt 232.4 lb

## 2015-12-06 DIAGNOSIS — I10 Essential (primary) hypertension: Secondary | ICD-10-CM | POA: Diagnosis not present

## 2015-12-06 DIAGNOSIS — E119 Type 2 diabetes mellitus without complications: Secondary | ICD-10-CM

## 2015-12-06 DIAGNOSIS — E785 Hyperlipidemia, unspecified: Secondary | ICD-10-CM

## 2015-12-06 MED ORDER — ATORVASTATIN CALCIUM 40 MG PO TABS: 40.0000 mg | ORAL_TABLET | Freq: Every day | ORAL | 3 refills | Status: DC

## 2015-12-06 NOTE — Assessment & Plan Note (Signed)
At goal. Continue Jardiance.

## 2015-12-06 NOTE — Patient Instructions (Signed)
Start the lipitor.  Continue the jardiance and lisinopril.  Follow up in 3 months.  Take care  Dr. Lacinda Axon

## 2015-12-06 NOTE — Assessment & Plan Note (Signed)
Patient and I had a long discussion about hyperlipidemia and treatment options. We also discussed risk related to his comorbidities. He is amenable to starting statin. Starting Lipitor.

## 2015-12-06 NOTE — Progress Notes (Signed)
Subjective:  Patient ID: Jerry Reid, male    DOB: 1975-05-22  Age: 40 y.o. MRN: [id number]  CC: Follow up  HPI:  40 year old male with obesity, hypertension, hyperlipidemia, DM 2 presents for follow-up.  DM 2  Now at goal. Most recent A1c 6.  Is doing very well on Jardiance.  Hypertension  Well controlled on lisinopril. Tolerating without difficulty.  Hyperlipidemia  Patient is not currently on statin as he has refused prior.  He like to discuss revisiting this today.  He is at high risk secondary to diabetes. Current guidelines support him being on medication.  Social Hx   Social History   Social History  . Marital status: Married    Spouse name: N/A  . Number of children: N/A  . Years of education: N/A   Social History Main Topics  . Smoking status: Never Smoker  . Smokeless tobacco: None  . Alcohol use No  . Drug use: No  . Sexual activity: Not Asked   Other Topics Concern  . None   Social History Narrative  . None    Review of Systems  Constitutional: Negative.   Endocrine: Negative.    Objective:  BP 116/77 (BP Location: Left Arm, Patient Position: Sitting, Cuff Size: Large)   Pulse 73   Temp 98.3 F (36.8 C) (Oral)   Resp 14   Wt 232 lb 6 oz (105.4 kg)   SpO2 96%   BMI 32.41 kg/m   BP/Weight 12/06/2015 09/26/2015 0000000  Systolic BP [id number]  Diastolic BP 77 78 86  Wt. (Lbs) 232.38 - 222.13  BMI 32.41 - 30.98   Physical Exam  Constitutional: He is oriented to person, place, and time. He appears well-developed. No distress.  Cardiovascular: Normal rate and regular rhythm.   Pulmonary/Chest: Effort normal and breath sounds normal.  Neurological: He is alert and oriented to person, place, and time.  Psychiatric: He has a normal mood and affect.  Vitals reviewed.   Lab Results  Component Value Date   WBC 8.3 09/26/2015   HGB 16.2 09/26/2015   HCT 44.7 09/26/2015   PLT 252 09/26/2015   GLUCOSE 167 (H) 09/26/2015   CHOL  183 12/02/2015   TRIG 267 (H) 12/02/2015   HDL 31 (L) 12/02/2015   LDLCALC 99 12/02/2015   ALT 34 09/26/2015   AST 31 09/26/2015   NA 137 09/26/2015   K 3.8 09/26/2015   CL 105 09/26/2015   CREATININE 1.09 09/26/2015   BUN 16 09/26/2015   CO2 25 09/26/2015   TSH 3.450 05/01/2015   HGBA1C 6.0 (H) 12/02/2015    Assessment & Plan:   Problem List Items Addressed This Visit    Hyperlipidemia    Patient and I had a long discussion about hyperlipidemia and treatment options. We also discussed risk related to his comorbidities. He is amenable to starting statin. Starting Lipitor.      Relevant Medications   atorvastatin (LIPITOR) 40 MG tablet   HTN (hypertension)    Well-controlled. Continue lisinopril.      Relevant Medications   atorvastatin (LIPITOR) 40 MG tablet   DM type 2 (diabetes mellitus, type 2) ([place]) - Primary    At goal. Continue Jardiance.      Relevant Medications   atorvastatin (LIPITOR) 40 MG tablet      Meds ordered this encounter  Medications  . atorvastatin (LIPITOR) 40 MG tablet    Sig: Take 1 tablet (40 mg total) by mouth daily.  Dispense:  90 tablet    Refill:  3    Follow-up: Return in about 3 months (around 03/07/2016).  [place]

## 2015-12-06 NOTE — Assessment & Plan Note (Signed)
Well-controlled.  Continue lisinopril. 

## 2016-03-12 ENCOUNTER — Encounter: Payer: Self-pay | Admitting: Family Medicine

## 2016-03-12 ENCOUNTER — Ambulatory Visit (INDEPENDENT_AMBULATORY_CARE_PROVIDER_SITE_OTHER): Payer: Managed Care, Other (non HMO) | Admitting: Family Medicine

## 2016-03-12 VITALS — BP 133/83 | HR 78 | Temp 98.5°F | Wt 238.4 lb

## 2016-03-12 DIAGNOSIS — E119 Type 2 diabetes mellitus without complications: Secondary | ICD-10-CM | POA: Diagnosis not present

## 2016-03-12 DIAGNOSIS — I10 Essential (primary) hypertension: Secondary | ICD-10-CM | POA: Diagnosis not present

## 2016-03-12 DIAGNOSIS — E785 Hyperlipidemia, unspecified: Secondary | ICD-10-CM

## 2016-03-12 NOTE — Patient Instructions (Addendum)
Continue your meds.  We will call with your results.  Take care  Follow up in 3 months.  Dr. Lacinda Axon

## 2016-03-12 NOTE — Assessment & Plan Note (Signed)
Stable on Lisinopril. Continue.

## 2016-03-12 NOTE — Progress Notes (Signed)
   Subjective:  Patient ID: Jerry Reid, male    DOB: 1975-12-13  Age: 41 y.o. MRN: [id number]  CC: Follow up   HPI:  41 year old male with hypertension, hyperlipidemia, DM 2 presents for follow-up.  DM-2  Sugars increasing (170's - 190's).  He attributed to dietary indiscretion.  He endorses compliance with Jardiance. He cannot tolerate Metformin.  Needs repeat A1C (last one was 6.0).  HTN  Stable on lisinopril.  Hyperlipidemia  Now on statin.  Compliant.  Needs repeat lipid panel to reassess.  Social Hx   Social History   Social History  . Marital status: Married    Spouse name: N/A  . Number of children: N/A  . Years of education: N/A   Social History Main Topics  . Smoking status: Never Smoker  . Smokeless tobacco: Current User  . Alcohol use No  . Drug use: No  . Sexual activity: Not Asked   Other Topics Concern  . None   Social History Narrative  . None    Review of Systems  Constitutional: Negative.   Endocrine: Negative.    Objective:  BP 133/83   Pulse 78   Temp 98.5 F (36.9 C) (Oral)   Wt 238 lb 6.4 oz (108.1 kg)   SpO2 97%   BMI 33.25 kg/m   BP/Weight 03/12/2016 [id number] Q000111Q  Systolic BP Q000111Q [id number] 123456  Diastolic BP 83 77 78  Wt. (Lbs) 238.4 232.38 -  BMI 33.25 32.41 -   Physical Exam  Constitutional: He is oriented to person, place, and time. He appears well-developed. No distress.  Cardiovascular: Normal rate and regular rhythm.   Pulmonary/Chest: Effort normal and breath sounds normal.  Neurological: He is alert and oriented to person, place, and time.  Psychiatric: He has a normal mood and affect.  Vitals reviewed.  Lab Results  Component Value Date   WBC 8.3 09/26/2015   HGB 16.2 09/26/2015   HCT 44.7 09/26/2015   PLT 252 09/26/2015   GLUCOSE 167 (H) 09/26/2015   CHOL 183 12/02/2015   TRIG 267 (H) 12/02/2015   HDL 31 (L) 12/02/2015   LDLCALC 99 12/02/2015   ALT 34 09/26/2015   AST 31 09/26/2015   NA 137  09/26/2015   K 3.8 09/26/2015   CL 105 09/26/2015   CREATININE 1.09 09/26/2015   BUN 16 09/26/2015   CO2 25 09/26/2015   TSH 3.450 05/01/2015   HGBA1C 6.0 (H) 12/02/2015    Assessment & Plan:   Problem List Items Addressed This Visit    Hyperlipidemia    Reassessing control since he is on statin now. Obtaining lipid panel. Continue Lipitor.      HTN (hypertension)    Stable on Lisinopril. Continue.      DM type 2 (diabetes mellitus, type 2) ([facility]) - Primary    Unsure of control. His sugars reflect recent lack of control A1c to be obtained (he gets labs at the [facility] acute care clinic). Continue Jardiance.        Follow-up: Return in about 3 months (around 06/09/2016) for Follow up Chronic medical issues.  [place]

## 2016-03-12 NOTE — Assessment & Plan Note (Signed)
Unsure of control. His sugars reflect recent lack of control A1c to be obtained (he gets labs at the [facility] acute care clinic). Continue Jardiance.

## 2016-03-12 NOTE — Progress Notes (Signed)
Pre visit review using our clinic review tool, if applicable. No additional management support is needed unless otherwise documented below in the visit note. 

## 2016-03-12 NOTE — Assessment & Plan Note (Signed)
Reassessing control since he is on statin now. Obtaining lipid panel. Continue Lipitor.

## 2016-05-12 ENCOUNTER — Other Ambulatory Visit: Payer: Self-pay | Admitting: Family Medicine

## 2016-05-20 ENCOUNTER — Ambulatory Visit: Payer: Self-pay | Admitting: Physician Assistant

## 2016-05-20 ENCOUNTER — Encounter: Payer: Self-pay | Admitting: Physician Assistant

## 2016-05-20 VITALS — BP 120/68 | HR 69 | Temp 98.1°F | Resp 12 | Ht 71.0 in | Wt 236.0 lb

## 2016-05-20 DIAGNOSIS — Z0189 Encounter for other specified special examinations: Secondary | ICD-10-CM

## 2016-05-20 DIAGNOSIS — Z0000 Encounter for general adult medical examination without abnormal findings: Secondary | ICD-10-CM

## 2016-05-20 DIAGNOSIS — Z008 Encounter for other general examination: Secondary | ICD-10-CM

## 2016-05-20 NOTE — Progress Notes (Signed)
S: pt here for wellness physical and biometrics for insurance purposes, no complaints ros neg. PMH:   DM, HTN, high cholesterol, followed by DR Lacinda Axon;  Social: nonsmoker, no etoh, no drugs Fam: both parents had MIs in their 48s, cad, mi, dm  O: vitals wnl, nad, ENT wnl, neck supple no lymph, lungs c t a, cv rrr, abd soft nontender bs normal all 4 quads  A: wellness, biometric physical  P: labs and will forward results to dr cook also, f/u with pcp, f/u here prn

## 2016-05-21 LAB — CMP12+LP+TP+TSH+6AC+PSA+CBC…
A/G RATIO: 1.7 (ref 1.2–2.2)
ALBUMIN: 4.6 g/dL (ref 3.5–5.5)
ALK PHOS: 56 IU/L (ref 39–117)
ALT: 33 IU/L (ref 0–44)
AST: 18 IU/L (ref 0–40)
BASOS ABS: 0 10*3/uL (ref 0.0–0.2)
BASOS: 0 %
BUN/Creatinine Ratio: 11 (ref 9–20)
BUN: 11 mg/dL (ref 6–24)
Bilirubin Total: 1.2 mg/dL (ref 0.0–1.2)
CHOL/HDL RATIO: 4.3 ratio (ref 0.0–5.0)
CHOLESTEROL TOTAL: 121 mg/dL (ref 100–199)
Calcium: 9.4 mg/dL (ref 8.7–10.2)
Chloride: 100 mmol/L (ref 96–106)
Creatinine, Ser: 0.98 mg/dL (ref 0.76–1.27)
EOS (ABSOLUTE): 0.1 10*3/uL (ref 0.0–0.4)
Eos: 1 %
Estimated CHD Risk: 0.8 times avg. (ref 0.0–1.0)
FREE THYROXINE INDEX: 2.1 (ref 1.2–4.9)
GFR calc Af Amer: 109 mL/min/{1.73_m2} (ref >59)
GFR calc non Af Amer: 94 mL/min/{1.73_m2} (ref >59)
GGT: 29 IU/L (ref 0–65)
GLOBULIN, TOTAL: 2.7 g/dL (ref 1.5–4.5)
Glucose: 146 mg/dL — ABNORMAL HIGH (ref 65–99)
HDL: 28 mg/dL — AB (ref >39)
HEMATOCRIT: 46.9 % (ref 37.5–51.0)
Hemoglobin: 16.4 g/dL (ref 13.0–17.7)
IMMATURE GRANULOCYTES: 0 %
Immature Grans (Abs): 0 10*3/uL (ref 0.0–0.1)
Iron: 90 ug/dL (ref 38–169)
LDH: 188 IU/L (ref 121–224)
LDL CALC: 40 mg/dL (ref 0–99)
LYMPHS: 31 %
Lymphocytes Absolute: 2.5 10*3/uL (ref 0.7–3.1)
MCH: 29.7 pg (ref 26.6–33.0)
MCHC: 35 g/dL (ref 31.5–35.7)
MCV: 85 fL (ref 79–97)
MONOS ABS: 0.5 10*3/uL (ref 0.1–0.9)
Monocytes: 6 %
NEUTROS PCT: 62 %
Neutrophils Absolute: 5 10*3/uL (ref 1.4–7.0)
PLATELETS: 218 10*3/uL (ref 150–379)
PROSTATE SPECIFIC AG, SERUM: 0.5 ng/mL (ref 0.0–4.0)
Phosphorus: 3 mg/dL (ref 2.5–4.5)
Potassium: 4.3 mmol/L (ref 3.5–5.2)
RBC: 5.53 x10E6/uL (ref 4.14–5.80)
RDW: 14.3 % (ref 12.3–15.4)
SODIUM: 141 mmol/L (ref 134–144)
T3 Uptake Ratio: 28 % (ref 24–39)
T4 TOTAL: 7.5 ug/dL (ref 4.5–12.0)
TRIGLYCERIDES: 263 mg/dL — AB (ref 0–149)
TSH: 4.3 u[IU]/mL (ref 0.450–4.500)
Total Protein: 7.3 g/dL (ref 6.0–8.5)
Uric Acid: 5.4 mg/dL (ref 3.7–8.6)
VLDL Cholesterol Cal: 53 mg/dL — ABNORMAL HIGH (ref 5–40)
WBC: 8 10*3/uL (ref 3.4–10.8)

## 2016-05-21 LAB — HGB A1C W/O EAG: Hgb A1c MFr Bld: 7 % — ABNORMAL HIGH (ref 4.8–5.6)

## 2016-06-09 ENCOUNTER — Ambulatory Visit (INDEPENDENT_AMBULATORY_CARE_PROVIDER_SITE_OTHER): Payer: Managed Care, Other (non HMO) | Admitting: Family Medicine

## 2016-06-09 ENCOUNTER — Encounter: Payer: Self-pay | Admitting: Family Medicine

## 2016-06-09 DIAGNOSIS — I10 Essential (primary) hypertension: Secondary | ICD-10-CM

## 2016-06-09 DIAGNOSIS — E785 Hyperlipidemia, unspecified: Secondary | ICD-10-CM | POA: Diagnosis not present

## 2016-06-09 DIAGNOSIS — E119 Type 2 diabetes mellitus without complications: Secondary | ICD-10-CM

## 2016-06-09 NOTE — Progress Notes (Signed)
   Subjective:  Patient ID: Jerry Reid, male    DOB: Aug 15, 1975  Age: 41 y.o. MRN: [id number]  CC: Follow up  HPI:  41 year old male with hypertension, HLD, DM 2 presents for follow-up.  Hypertension  At goal on lisinopril.  Hyperlipidemia  Most recent labs in May, prior to this visit.  LDL at goal.  Compliant with Lipitor.  DM  Blood sugars readings - fastings have been rising. Mostly in the 180s.  Hypoglycemia - no.  Medications - metformin and Jardiance.   Adverse effects - no.  Compliance - yes.  Social Hx   Social History   Social History  . Marital status: Married    Spouse name: N/A  . Number of children: N/A  . Years of education: N/A   Social History Main Topics  . Smoking status: Never Smoker  . Smokeless tobacco: Current User  . Alcohol use No  . Drug use: No  . Sexual activity: Not Asked   Other Topics Concern  . None   Social History Narrative  . None    Review of Systems  Constitutional: Negative.   Respiratory: Negative.   Cardiovascular: Negative.    Objective:  BP 108/78 (BP Location: Right Arm, Patient Position: Sitting, Cuff Size: Normal)   Pulse 88   Temp 98.7 F (37.1 C) (Oral)   Wt 236 lb 4 oz (107.2 kg)   SpO2 98%   BMI 32.95 kg/m   BP/Weight 06/09/2016 05/20/2016 07/16/3149  Systolic BP 761 607 371  Diastolic BP 78 68 83  Wt. (Lbs) 236.25 236 238.4  BMI 32.95 32.92 33.25    Physical Exam  Constitutional: He is oriented to person, place, and time. He appears well-developed. No distress.  Cardiovascular: Normal rate and regular rhythm.   Pulmonary/Chest: Effort normal and breath sounds normal.  Neurological: He is alert and oriented to person, place, and time.  Psychiatric: He has a normal mood and affect.  Vitals reviewed.   Lab Results  Component Value Date   WBC 8.0 05/20/2016   HGB 16.2 09/26/2015   HCT 46.9 05/20/2016   PLT 218 05/20/2016   GLUCOSE 146 (H) 05/20/2016   CHOL 121 05/20/2016   TRIG 263  (H) 05/20/2016   HDL 28 (L) 05/20/2016   LDLCALC 40 05/20/2016   ALT 33 05/20/2016   AST 18 05/20/2016   NA 141 05/20/2016   K 4.3 05/20/2016   CL 100 05/20/2016   CREATININE 0.98 05/20/2016   BUN 11 05/20/2016   CO2 25 09/26/2015   TSH 4.300 05/20/2016   HGBA1C 7.0 (H) 05/20/2016    Assessment & Plan:   Problem List Items Addressed This Visit    Hyperlipidemia    At goal. Continue Lipitor.      HTN (hypertension)    At goal on lisinopril. Continue.      DM type 2 (diabetes mellitus, type 2) (HCC)    A1c 7. At goal. Continue Jardiance and metformin. Advised exercise and dietary changes. Patient in agreement.        Follow-up: 6 months  [place] DO [facility]

## 2016-06-09 NOTE — Patient Instructions (Signed)
Changes as we discussed.   Follow up in 6 months.  Take care  Dr. Lacinda Axon

## 2016-06-09 NOTE — Assessment & Plan Note (Signed)
At goal on lisinopril. Continue. 

## 2016-06-09 NOTE — Assessment & Plan Note (Signed)
A1c 7. At goal. Continue Jardiance and metformin. Advised exercise and dietary changes. Patient in agreement.

## 2016-06-09 NOTE — Assessment & Plan Note (Signed)
At goal. Continue Lipitor. 

## 2016-06-09 NOTE — Progress Notes (Signed)
Pre visit review using our clinic review tool, if applicable. No additional management support is needed unless otherwise documented below in the visit note. 

## 2016-08-24 ENCOUNTER — Encounter: Payer: Self-pay | Admitting: Family Medicine

## 2016-09-15 ENCOUNTER — Telehealth: Payer: Self-pay | Admitting: Family Medicine

## 2016-09-15 MED ORDER — EMPAGLIFLOZIN 10 MG PO TABS: 10.0000 mg | ORAL_TABLET | Freq: Every day | ORAL | 0 refills | Status: DC

## 2016-09-15 NOTE — Telephone Encounter (Signed)
Pt called and stated that he is out of town an forgot his medication. HPt is needing empagliflozin (JARDIANCE) 10 MG TABS tablet. Please advise, thank you!  Pharmacy - [id number] - [place], [place]  Call pt @ [phone]

## 2016-09-15 NOTE — Telephone Encounter (Signed)
Script sent patient advised.

## 2016-09-21 ENCOUNTER — Other Ambulatory Visit: Payer: Self-pay | Admitting: Family Medicine

## 2016-10-25 ENCOUNTER — Other Ambulatory Visit: Payer: Self-pay | Admitting: Family Medicine

## 2016-10-26 NOTE — Telephone Encounter (Signed)
No coming appt, last one was 06/09/16, last refill was 09/15/16, please advise, thanks

## 2016-10-26 NOTE — Telephone Encounter (Signed)
Sent to pharmacy. Patient needs follow-up scheduled. Thanks.

## 2016-12-14 ENCOUNTER — Ambulatory Visit: Payer: Self-pay | Admitting: Family Medicine

## 2016-12-15 ENCOUNTER — Encounter: Payer: Self-pay | Admitting: Internal Medicine

## 2016-12-15 ENCOUNTER — Ambulatory Visit: Payer: Managed Care, Other (non HMO) | Admitting: Internal Medicine

## 2016-12-15 VITALS — BP 124/82 | HR 77 | Temp 98.5°F | Ht 71.0 in | Wt 237.1 lb

## 2016-12-15 DIAGNOSIS — E119 Type 2 diabetes mellitus without complications: Secondary | ICD-10-CM | POA: Diagnosis not present

## 2016-12-15 DIAGNOSIS — E669 Obesity, unspecified: Secondary | ICD-10-CM

## 2016-12-15 DIAGNOSIS — Z23 Encounter for immunization: Secondary | ICD-10-CM

## 2016-12-15 DIAGNOSIS — E785 Hyperlipidemia, unspecified: Secondary | ICD-10-CM | POA: Diagnosis not present

## 2016-12-15 MED ORDER — LISINOPRIL 10 MG PO TABS: 10.0000 mg | ORAL_TABLET | Freq: Every day | ORAL | 1 refills | Status: DC

## 2016-12-15 MED ORDER — ATORVASTATIN CALCIUM 40 MG PO TABS: 40.0000 mg | ORAL_TABLET | Freq: Every day | ORAL | 1 refills | Status: DC

## 2016-12-15 MED ORDER — EMPAGLIFLOZIN 10 MG PO TABS: 10.0000 mg | ORAL_TABLET | Freq: Every day | ORAL | 1 refills | Status: DC

## 2016-12-15 NOTE — Progress Notes (Signed)
Chief Complaint  Patient presents with  . Follow-up    diabetes    Pt presents for f/u  1. DM he reports note been checking glucose like he should and am cbg fasting was 180 on Jardiance 10. BP controlled today  2. No concerns  3. hld reviewed TGS 263 and need to get at goal <150    Review of Systems  Constitutional: Negative for weight loss.  Respiratory: Negative for shortness of breath.   Cardiovascular: Negative for chest pain.  Gastrointestinal: Negative for abdominal pain and blood in stool.   Past Medical History:  Diagnosis Date  . Chest pain   . Chicken pox   . Cholelithiasis   . Diabetes mellitus without complication ([place])   . HTN (hypertension)   . Hyperlipidemia   . Kidney stones    Past Surgical History:  Procedure Laterality Date  . NO PAST SURGERIES     Family History  Problem Relation Age of Onset  . Heart attack Mother   . Heart disease Mother   . Hyperlipidemia Mother   . Hypertension Mother   . Diabetes Mother   . Hypertension Father   . Heart disease Father   . Hyperlipidemia Father   . Diabetes Father    Social History   Socioeconomic History  . Marital status: Married    Spouse name: Not on file  . Number of children: Not on file  . Years of education: Not on file  . Highest education level: Not on file  Social Needs  . Financial resource strain: Not on file  . Food insecurity - worry: Not on file  . Food insecurity - inability: Not on file  . Transportation needs - medical: Not on file  . Transportation needs - non-medical: Not on file  Occupational History  . Not on file  Tobacco Use  . Smoking status: Never Smoker  . Smokeless tobacco: Current User  Substance and Sexual Activity  . Alcohol use: No  . Drug use: No  . Sexual activity: Not on file  Other Topics Concern  . 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 atorvastatin  (LIPITOR) 40 MG tablet Take 1 tablet (40 mg total) by mouth daily.  Marland Kitchen glucose blood (COOL BLOOD GLUCOSE TEST STRIPS) test strip Use as instructed to check blood glucose up to three times a day, E11.29. Please dispense one touch strips per his insurance. Thanks  . JARDIANCE 10 MG TABS tablet TAKE 1 TABLET BY MOUTH DAILY  . lisinopril (PRINIVIL,ZESTRIL) 10 MG tablet TAKE 1 TABLET (10 MG TOTAL) BY MOUTH DAILY.   Allergies  Allergen Reactions  . Metformin And Related Diarrhea   No results found for this or any previous visit (from the past 2160 hour(s)). Objective  Body mass index is 33.07 kg/m. Wt Readings from Last 3 Encounters:  12/15/16 237 lb 2 oz (107.6 kg)  06/09/16 236 lb 4 oz (107.2 kg)  05/20/16 236 lb (107 kg)   Temp Readings from Last 3 Encounters:  12/15/16 98.5 F (36.9 C) (Oral)  06/09/16 98.7 F (37.1 C) (Oral)  05/20/16 98.1 F (36.7 C) (Oral)   BP Readings from Last 3 Encounters:  12/15/16 124/82  06/09/16 108/78  05/20/16 120/68   Pulse Readings from Last 3 Encounters:  12/15/16 77  06/09/16 88  05/20/16 69   Physical Exam  Constitutional:  He is oriented to person, place, and time and well-developed, well-nourished, and in no distress. Vital signs are normal.  HENT:  Head: Normocephalic and atraumatic.  Mouth/Throat: Oropharynx is clear and moist and mucous membranes are normal.  Eyes: Conjunctivae are normal. Pupils are equal, round, and reactive to light. Right eye exhibits no discharge.  Cardiovascular: Normal rate, regular rhythm and normal heart sounds.  Neg leg edema b/l   Pulmonary/Chest: Effort normal and breath sounds normal.  Abdominal: Soft. Bowel sounds are normal. There is no tenderness.  Neurological: He is alert and oriented to person, place, and time. Gait normal.  Skin: Skin is warm, dry and intact.  Psychiatric: Mood, memory, affect and judgment normal.  Nursing note and vitals reviewed.  Assessment   1. DM 2  2. HLD  3.  HM  Plan  1.  Continue Jardiance for now 10 mg  Check CMET, CBC, A1C, TSH in 2 weeks at outside facility given lab orders on Rx today  Does not have to urinate today will check UA/urine protein at f/u  Continue statin, ACEI Referred to [facility] eye care for DM eye exam  Will foot exam at f/u   2.  Disc exercise, med tx I.e OTC or Rx fish oil Pt wants to monitor for now  Mailed copy of cholesterol levels  Continue statin  3.  Declines flu shot for now  Given pna 23 shot today  Tdap 02/17/13  Need to check hep B immunity in future   PSA nl 05/20/16 reviewed no FH prostate cancer   Skin check had recently with PA-C [place] at Dr. Jenel Lucks office with precancerous mole bx'ed.   H/o low testosterone 05/01/15 264 will disc with pt at f/u   Provider: Dr. Olivia Mackie McLean-Scocuzza

## 2016-12-15 NOTE — Patient Instructions (Signed)
Follow up in 4-6 months sooner if needed  Please get eye exam  We gave you the pneumonia 23 vaccine today  Remember sweetest fruits are mangos, grapes, cherries, pears, watermelon, bananas Less sweet Strawberries, Avacados, Guavas, Raspberries, Canteloupe, and Papaya Try to exercise   Exercising to Lose Weight Exercising can help you to lose weight. In order to lose weight through exercise, you need to do vigorous-intensity exercise. You can tell that you are exercising with vigorous intensity if you are breathing very hard and fast and cannot hold a conversation while exercising. Moderate-intensity exercise helps to maintain your current weight. You can tell that you are exercising at a moderate level if you have a higher heart rate and faster breathing, but you are still able to hold a conversation. How often should I exercise? Choose an activity that you enjoy and set realistic goals. Your health care provider can help you to make an activity plan that works for you. Exercise regularly as directed by your health care provider. This may include:  Doing resistance training twice each week, such as: ? Push-ups. ? Sit-ups. ? Lifting weights. ? Using resistance bands.  Doing a given intensity of exercise for a given amount of time. Choose from these options: ? 150 minutes of moderate-intensity exercise every week. ? 75 minutes of vigorous-intensity exercise every week. ? A mix of moderate-intensity and vigorous-intensity exercise every week.  Children, pregnant women, people who are out of shape, people who are overweight, and older adults may need to consult a health care provider for individual recommendations. If you have any sort of medical condition, be sure to consult your health care provider before starting a new exercise program. What are some activities that can help me to lose weight?  Walking at a rate of at least 4.5 miles an hour.  Jogging or running at a rate of 5 miles per  hour.  Biking at a rate of at least 10 miles per hour.  Lap swimming.  Roller-skating or in-line skating.  Cross-country skiing.  Vigorous competitive sports, such as football, basketball, and soccer.  Jumping rope.  Aerobic dancing. How can I be more active in my day-to-day activities?  Use the stairs instead of the elevator.  Take a walk during your lunch break.  If you drive, park your car farther away from work or school.  If you take public transportation, get off one stop early and walk the rest of the way.  Make all of your phone calls while standing up and walking around.  Get up, stretch, and walk around every 30 minutes throughout the day. What guidelines should I follow while exercising?  Do not exercise so much that you hurt yourself, feel dizzy, or get very short of breath.  Consult your health care provider prior to starting a new exercise program.  Wear comfortable clothes and shoes with good support.  Drink plenty of water while you exercise to prevent dehydration or heat stroke. Body water is lost during exercise and must be replaced.  Work out until you breathe faster and your heart beats faster. This information is not intended to replace advice given to you by your health care provider. Make sure you discuss any questions you have with your health care provider. Document Released: 02/07/2010 Document Revised: 06/13/2015 Document Reviewed: 06/08/2013 Elsevier Interactive Patient Education  2018 [employer].   Cholesterol Cholesterol is a fat. Your body needs a small amount of cholesterol. Cholesterol (plaque) may build up in your blood  vessels (arteries). That makes you more likely to have a heart attack or stroke. You cannot feel your cholesterol level. Having a blood test is the only way to find out if your level is high. Keep your test results. Work with your doctor to keep your cholesterol at a good level. What do the results mean?  Total  cholesterol is how much cholesterol is in your blood.  LDL is bad cholesterol. This is the type that can build up. Try to have low LDL.  HDL is good cholesterol. It cleans your blood vessels and carries LDL away. Try to have high HDL.  Triglycerides are fat that the body can store or burn for energy. What are good levels of cholesterol?  Total cholesterol below 200.  LDL below 100 is good for people who have health risks. LDL below 70 is good for people who have very high risks.  HDL above 40 is good. It is best to have HDL of 60 or higher.  Triglycerides below 150. How can I lower my cholesterol? Diet Follow your diet program as told by your doctor.  Choose fish, white meat chicken, or [place] that is roasted or baked. Try not to eat red meat, fried foods, sausage, or lunch meats.  Eat lots of fresh fruits and vegetables.  Choose whole grains, beans, pasta, potatoes, and cereals.  Choose olive oil, corn oil, or canola oil. Only use small amounts.  Try not to eat butter, mayonnaise, shortening, or palm kernel oils.  Try not to eat foods with trans fats.  Choose low-fat or nonfat dairy foods. ? Drink skim or nonfat milk. ? Eat low-fat or nonfat yogurt and cheeses. ? Try not to drink whole milk or cream. ? Try not to eat ice cream, egg yolks, or full-fat cheeses.  Healthy desserts include angel food cake, ginger snaps, animal crackers, hard candy, popsicles, and low-fat or nonfat frozen yogurt. Try not to eat pastries, cakes, pies, and cookies.  Exercise Follow your exercise program as told by your doctor.  Be more active. Try gardening, walking, and taking the stairs.  Ask your doctor about ways that you can be more active.  Medicine  Take over-the-counter and prescription medicines only as told by your doctor.  This information is not intended to replace advice given to you by your health care provider. Make sure you discuss any questions you have with your health  care provider. Document Released: 04/03/2008 Document Revised: 08/07/2015 Document Reviewed: 07/18/2015 Elsevier Interactive Patient Education  2017 [employer].  Diabetes Mellitus and Food It is important for you to manage your blood sugar (glucose) level. Your blood glucose level can be greatly affected by what you eat. Eating healthier foods in the appropriate amounts throughout the day at about the same time each day will help you control your blood glucose level. It can also help slow or prevent worsening of your diabetes mellitus. Healthy eating may even help you improve the level of your blood pressure and reach or maintain a healthy weight. General recommendations for healthful eating and cooking habits include:  Eating meals and snacks regularly. Avoid going long periods of time without eating to lose weight.  Eating a diet that consists mainly of plant-based foods, such as fruits, vegetables, nuts, legumes, and whole grains.  Using low-heat cooking methods, such as baking, instead of high-heat cooking methods, such as deep frying.  Work with your dietitian to make sure you understand how to use the Nutrition Facts information on food  labels. How can food affect me? Carbohydrates Carbohydrates affect your blood glucose level more than any other type of food. Your dietitian will help you determine how many carbohydrates to eat at each meal and teach you how to count carbohydrates. Counting carbohydrates is important to keep your blood glucose at a healthy level, especially if you are using insulin or taking certain medicines for diabetes mellitus. Alcohol Alcohol can cause sudden decreases in blood glucose (hypoglycemia), especially if you use insulin or take certain medicines for diabetes mellitus. Hypoglycemia can be a life-threatening condition. Symptoms of hypoglycemia (sleepiness, dizziness, and disorientation) are similar to symptoms of having too much alcohol. If your health care  provider has given you approval to drink alcohol, do so in moderation and use the following guidelines:  Women should not have more than one drink per day, and men should not have more than two drinks per day. One drink is equal to: ? 12 oz of beer. ? 5 oz of wine. ? 1 oz of hard liquor.  Do not drink on an empty stomach.  Keep yourself hydrated. Have water, diet soda, or unsweetened iced tea.  Regular soda, juice, and other mixers might contain a lot of carbohydrates and should be counted.  What foods are not recommended? As you make food choices, it is important to remember that all foods are not the same. Some foods have fewer nutrients per serving than other foods, even though they might have the same number of calories or carbohydrates. It is difficult to get your body what it needs when you eat foods with fewer nutrients. Examples of foods that you should avoid that are high in calories and carbohydrates but low in nutrients include:  Trans fats (most processed foods list trans fats on the Nutrition Facts label).  Regular soda.  Juice.  Candy.  Sweets, such as cake, pie, doughnuts, and cookies.  Fried foods.  What foods can I eat? Eat nutrient-rich foods, which will nourish your body and keep you healthy. The food you should eat also will depend on several factors, including:  The calories you need.  The medicines you take.  Your weight.  Your blood glucose level.  Your blood pressure level.  Your cholesterol level.  You should eat a variety of foods, including:  Protein. ? Lean cuts of meat. ? Proteins low in saturated fats, such as fish, egg whites, and beans. Avoid processed meats.  Fruits and vegetables. ? Fruits and vegetables that may help control blood glucose levels, such as apples, mangoes, and yams.  Dairy products. ? Choose fat-free or low-fat dairy products, such as milk, yogurt, and cheese.  Grains, bread, pasta, and rice. ? Choose whole  grain products, such as multigrain bread, whole oats, and brown rice. These foods may help control blood pressure.  Fats. ? Foods containing healthful fats, such as nuts, avocado, olive oil, canola oil, and fish.  Does everyone with diabetes mellitus have the same meal plan? Because every person with diabetes mellitus is different, there is not one meal plan that works for everyone. It is very important that you meet with a dietitian who will help you create a meal plan that is just right for you. This information is not intended to replace advice given to you by your health care provider. Make sure you discuss any questions you have with your health care provider. Document Released: 10/02/2004 Document Revised: 06/13/2015 Document Reviewed: 12/02/2012 Elsevier Interactive Patient Education  2017 [employer].

## 2016-12-15 NOTE — Progress Notes (Signed)
Hi Jerry Reid,   These are your cholesterol levels over the past year. Your LDL for 05/01/15 was not calculated because your triglycerides were high.   Please read the cholesterol information given and consider lifestyle changes and if that does not work we will need to treat with medications.   For 05/2016 Your LDL (bad cholesterol) is at goal <100 and your total cholesterol is at goal <200. Your HDL (good cholesterol) is low. Sometimes I have seen the HDL go up with healthy lifestyle changes. It is normal for a man when it is 3 and if above 40 may offer protective benefits.     Results for Jerry Reid, Jerry Reid   Ref. Range 05/01/2015 11:22  Total CHOL/HDL Ratio Latest Ref Range: 0.0 - 5.0 ratio units 6.5 (H)  Cholesterol, Total Latest Ref Range: 100 - 199 mg/dL 182  HDL Cholesterol Latest Ref Range: >39 mg/dL 28 (L)  LDL (calc) Latest Ref Range: 0 - 99 mg/dL Comment  Triglycerides Latest Ref Range: 0 - 149 mg/dL 526 (H)  VLDL Cholesterol Cal Latest Ref Range: 5 - 40 mg/dL Comment   Results for Jerry Reid, Jerry Reid  Ref. Range 12/02/2015 09:00  Total CHOL/HDL Ratio Latest Ref Range: 0.0 - 5.0 ratio units 5.9 (H)  Cholesterol, Total Latest Ref Range: 100 - 199 mg/dL 183  HDL Cholesterol Latest Ref Range: >39 mg/dL 31 (L)  LDL (calc) Latest Ref Range: 0 - 99 mg/dL 99  Triglycerides Latest Ref Range: 0 - 149 mg/dL 267 (H)  VLDL Cholesterol Cal Latest Ref Range: 5 - 40 mg/dL 53 (H)   Results for Jerry Reid, Jerry Reid  Ref. Range 05/20/2016 10:42  Total CHOL/HDL Ratio Latest Ref Range: 0.0 - 5.0 ratio 4.3  Cholesterol, Total Latest Ref Range: 100 - 199 mg/dL 121  HDL Cholesterol Latest Ref Range: >39 mg/dL 28 (L)  LDL (calc) Latest Ref Range: 0 - 99 mg/dL 40  Triglycerides Latest Ref Range: 0 - 149 mg/dL 263 (H)  VLDL Cholesterol Cal Latest Ref Range: 5 - 40 mg/dL 53 (H)   Sincerely,   Dr. Olivia Mackie McLean-Scocuzza

## 2017-01-06 ENCOUNTER — Telehealth: Payer: Self-pay | Admitting: Internal Medicine

## 2017-01-06 NOTE — Telephone Encounter (Signed)
Copied from [place] [id number]. Topic: Quick Communication - See Telephone Encounter >> Jan 06, 2017 11:16 AM Burnis Medin, NT wrote: CRM for notification. See Telephone encounter for: Pt called in and said the doctor gave him a paper to take to his job to get blood work done and he misplaced the paper. Pt. Wants to see if he can come by the office and get another paper to get blood work done. Pt would like a call back when paper is ready to be picked up.   01/06/17.

## 2017-01-06 NOTE — Telephone Encounter (Signed)
Please advise 

## 2017-01-07 NOTE — Telephone Encounter (Signed)
New lab sheet ready to pick up  Want him to go in am and fast x 12 hours only medication and water  Thanks Kinney

## 2017-01-08 NOTE — Telephone Encounter (Signed)
Patient advised of below and verbalized understanding will placed up front for pick up.   He states it will be after the first of the year before he gets it done.

## 2017-01-20 ENCOUNTER — Ambulatory Visit: Payer: Self-pay | Admitting: Internal Medicine

## 2017-05-28 ENCOUNTER — Ambulatory Visit: Payer: Self-pay | Admitting: Family Medicine

## 2017-05-28 VITALS — BP 135/93 | HR 67 | Resp 15 | Ht 71.0 in | Wt 237.0 lb

## 2017-05-28 DIAGNOSIS — Z1329 Encounter for screening for other suspected endocrine disorder: Secondary | ICD-10-CM

## 2017-05-28 DIAGNOSIS — E118 Type 2 diabetes mellitus with unspecified complications: Secondary | ICD-10-CM

## 2017-05-28 DIAGNOSIS — I10 Essential (primary) hypertension: Secondary | ICD-10-CM

## 2017-05-28 DIAGNOSIS — E1159 Type 2 diabetes mellitus with other circulatory complications: Secondary | ICD-10-CM

## 2017-05-28 DIAGNOSIS — E785 Hyperlipidemia, unspecified: Secondary | ICD-10-CM

## 2017-05-28 DIAGNOSIS — Z13220 Encounter for screening for lipoid disorders: Secondary | ICD-10-CM

## 2017-05-28 DIAGNOSIS — Z299 Encounter for prophylactic measures, unspecified: Secondary | ICD-10-CM

## 2017-05-28 DIAGNOSIS — Z0189 Encounter for other specified special examinations: Principal | ICD-10-CM

## 2017-05-28 DIAGNOSIS — Z008 Encounter for other general examination: Secondary | ICD-10-CM

## 2017-05-28 NOTE — Progress Notes (Signed)
Subjective: Annual biometrics screening  Patient presents for his annual biometric screening. Patient reports a history of hypertension, diabetes, and hyperlipidemia.  Patient reports he does not currently eat a healthy, well-rounded diet or get regular exercise. PCP: Dr. Lacinda Axon. Patient denies any other issues or concerns.   Review of Systems Unremarkable  Objective  Physical Exam General: Awake, alert and oriented. No acute distress. Well developed, hydrated and nourished. Appears stated age.  HEENT: Supple neck without adenopathy. Sclera is non-icteric. The ear canal is clear without discharge. The tympanic membrane is normal in appearance with normal landmarks and cone of light. Nasal mucosa is pink and moist. Oral mucosa is pink and moist. The pharynx is normal in appearance without tonsillar swelling or exudates.  Skin: Skin in warm, dry and intact without rashes or lesions. Appropriate color for ethnicity. Cardiac: Heart rate and rhythm are normal. No murmurs, gallops, or rubs are auscultated.  Respiratory: The chest wall is symmetric and without deformity. No signs of respiratory distress. Lung sounds are clear in all lobes bilaterally without rales, ronchi, or wheezes.  Neurological: The patient is awake, alert and oriented to person, place, and time with normal speech.  Memory is normal and thought processes intact. No gait abnormalities are appreciated.  Psychiatric: Appropriate mood and affect.   Assessment Annual biometrics screening  Plan  Lipid panel and CMP ordered by patient's primary care provider.  Will use these results when available for biometric screening labs. Blood pressure is 135/93 today.  Discussed normal values and advised patient to monitor his blood pressure regularly and report abnormal values to his primary care provider. Encouraged routine visits with primary care provider.  Encouraged patient to get regular exercise and eat a healthy, well-rounded diet.

## 2017-05-28 NOTE — Addendum Note (Signed)
Addended by: Carlene Coria on: 05/28/2017 10:06 AM   Modules accepted: Orders

## 2017-05-29 LAB — URINALYSIS, ROUTINE W REFLEX MICROSCOPIC
BILIRUBIN UA: NEGATIVE
KETONES UA: NEGATIVE
Leukocytes, UA: NEGATIVE
Nitrite, UA: NEGATIVE
Protein, UA: NEGATIVE
RBC, UA: NEGATIVE
Urobilinogen, Ur: 0.2 mg/dL (ref 0.2–1.0)
pH, UA: 5 (ref 5.0–7.5)

## 2017-05-29 LAB — MICROALBUMIN / CREATININE URINE RATIO
Creatinine, Urine: 73 mg/dL
MICROALB/CREAT RATIO: 5.3 mg/g{creat} (ref 0.0–30.0)
Microalbumin, Urine: 3.9 ug/mL

## 2017-05-30 LAB — CBC WITH DIFFERENTIAL/PLATELET
BASOS: 1 %
Basophils Absolute: 0 10*3/uL (ref 0.0–0.2)
EOS (ABSOLUTE): 0.1 10*3/uL (ref 0.0–0.4)
EOS: 1 %
HEMATOCRIT: 48.6 % (ref 37.5–51.0)
HEMOGLOBIN: 17.4 g/dL (ref 13.0–17.7)
IMMATURE GRANULOCYTES: 1 %
Immature Grans (Abs): 0 10*3/uL (ref 0.0–0.1)
Lymphocytes Absolute: 2.4 10*3/uL (ref 0.7–3.1)
Lymphs: 33 %
MCH: 30.1 pg (ref 26.6–33.0)
MCHC: 35.8 g/dL — ABNORMAL HIGH (ref 31.5–35.7)
MCV: 84 fL (ref 79–97)
MONOCYTES: 6 %
Monocytes Absolute: 0.4 10*3/uL (ref 0.1–0.9)
NEUTROS PCT: 58 %
Neutrophils Absolute: 4.2 10*3/uL (ref 1.4–7.0)
Platelets: 220 10*3/uL (ref 150–379)
RBC: 5.78 x10E6/uL (ref 4.14–5.80)
RDW: 15.1 % (ref 12.3–15.4)
WBC: 7.2 10*3/uL (ref 3.4–10.8)

## 2017-05-30 LAB — LIPID PANEL
CHOLESTEROL TOTAL: 143 mg/dL (ref 100–199)
Chol/HDL Ratio: 4.9 ratio (ref 0.0–5.0)
HDL: 29 mg/dL — AB (ref >39)
LDL Calculated: 50 mg/dL (ref 0–99)
Triglycerides: 318 mg/dL — ABNORMAL HIGH (ref 0–149)
VLDL CHOLESTEROL CAL: 64 mg/dL — AB (ref 5–40)

## 2017-05-30 LAB — COMPREHENSIVE METABOLIC PANEL
ALT: 35 IU/L (ref 0–44)
AST: 19 IU/L (ref 0–40)
Albumin/Globulin Ratio: 1.7 (ref 1.2–2.2)
Albumin: 4.6 g/dL (ref 3.5–5.5)
Alkaline Phosphatase: 59 IU/L (ref 39–117)
BUN/Creatinine Ratio: 9 (ref 9–20)
BUN: 9 mg/dL (ref 6–24)
Bilirubin Total: 1.5 mg/dL — ABNORMAL HIGH (ref 0.0–1.2)
CALCIUM: 9.8 mg/dL (ref 8.7–10.2)
CHLORIDE: 103 mmol/L (ref 96–106)
CO2: 21 mmol/L (ref 20–29)
CREATININE: 0.99 mg/dL (ref 0.76–1.27)
GFR calc Af Amer: 107 mL/min/{1.73_m2} (ref >59)
GFR, EST NON AFRICAN AMERICAN: 92 mL/min/{1.73_m2} (ref >59)
GLOBULIN, TOTAL: 2.7 g/dL (ref 1.5–4.5)
Glucose: 174 mg/dL — ABNORMAL HIGH (ref 65–99)
Potassium: 4.6 mmol/L (ref 3.5–5.2)
SODIUM: 143 mmol/L (ref 134–144)
TOTAL PROTEIN: 7.3 g/dL (ref 6.0–8.5)

## 2017-05-30 LAB — TESTOSTERONE,FREE AND TOTAL
TESTOSTERONE FREE: 15.9 pg/mL (ref 6.8–21.5)
TESTOSTERONE: 432 ng/dL (ref 264–916)

## 2017-05-30 LAB — TSH+FREE T4
FREE T4: 1.34 ng/dL (ref 0.82–1.77)
TSH: 3.55 u[IU]/mL (ref 0.450–4.500)

## 2017-05-30 LAB — HEPATITIS B SURFACE ANTIBODY,QUALITATIVE: Hep B Surface Ab, Qual: NONREACTIVE

## 2017-05-30 LAB — HGB A1C W/O EAG: Hgb A1c MFr Bld: 7.7 % — ABNORMAL HIGH (ref 4.8–5.6)

## 2017-07-15 ENCOUNTER — Telehealth: Payer: Self-pay

## 2017-07-15 NOTE — Telephone Encounter (Signed)
Pt. Called to get lab results. Given with recommendations. States he wants to change providers and wants to go to [facility] location. Agent is assisting pt. With this.

## 2017-07-15 NOTE — Telephone Encounter (Signed)
Fine with me

## 2017-07-15 NOTE — Telephone Encounter (Signed)
Copied from [place] [id number]. Topic: Appointment Scheduling - Scheduling Inquiry for Clinic >> Jul 15, 2017 10:52 AM Lennox Solders wrote: Reason for CRM: pt would like to switch to regina baity at [place] location

## 2017-07-16 ENCOUNTER — Other Ambulatory Visit: Payer: Self-pay | Admitting: Internal Medicine

## 2017-07-16 MED ORDER — ATORVASTATIN CALCIUM 40 MG PO TABS: 40.0000 mg | ORAL_TABLET | Freq: Every day | ORAL | 1 refills | Status: DC

## 2017-07-19 NOTE — Telephone Encounter (Signed)
I called pt to set him up for a transfer of care appt with [facility]. Next available is 8/13. I left detailed msg.

## 2017-08-24 ENCOUNTER — Encounter: Payer: Self-pay | Admitting: Family Medicine

## 2017-08-31 ENCOUNTER — Encounter: Payer: Self-pay | Admitting: Internal Medicine

## 2017-08-31 ENCOUNTER — Ambulatory Visit: Payer: Managed Care, Other (non HMO) | Admitting: Internal Medicine

## 2017-08-31 VITALS — BP 124/82 | HR 72 | Temp 98.4°F | Wt 234.0 lb

## 2017-08-31 DIAGNOSIS — R7989 Other specified abnormal findings of blood chemistry: Secondary | ICD-10-CM | POA: Diagnosis not present

## 2017-08-31 DIAGNOSIS — I10 Essential (primary) hypertension: Secondary | ICD-10-CM | POA: Diagnosis not present

## 2017-08-31 DIAGNOSIS — E119 Type 2 diabetes mellitus without complications: Secondary | ICD-10-CM | POA: Diagnosis not present

## 2017-08-31 DIAGNOSIS — E7800 Pure hypercholesterolemia, unspecified: Secondary | ICD-10-CM

## 2017-08-31 DIAGNOSIS — K219 Gastro-esophageal reflux disease without esophagitis: Secondary | ICD-10-CM | POA: Diagnosis not present

## 2017-08-31 LAB — POCT GLYCOSYLATED HEMOGLOBIN (HGB A1C): Hemoglobin A1C: 7.5 % — AB (ref 4.0–5.6)

## 2017-08-31 NOTE — Patient Instructions (Signed)
Fat and Cholesterol Restricted Diet Getting too much fat and cholesterol in your diet may cause health problems. Following this diet helps keep your fat and cholesterol at normal levels. This can keep you from getting sick. What types of fat should I choose?  Choose monosaturated and polyunsaturated fats. These are found in foods such as olive oil, canola oil, flaxseeds, walnuts, almonds, and seeds.  Eat more omega-3 fats. Good choices include salmon, mackerel, sardines, tuna, flaxseed oil, and ground flaxseeds.  Limit saturated fats. These are in animal products such as meats, butter, and cream. They can also be in plant products such as palm oil, palm kernel oil, and coconut oil.  Avoid foods with partially hydrogenated oils in them. These contain trans fats. Examples of foods that have trans fats are stick margarine, some tub margarines, cookies, crackers, and other baked goods. What general guidelines do I need to follow?  Check food labels. Look for the words "trans fat" and "saturated fat."  When preparing a meal: ? Fill half of your plate with vegetables and green salads. ? Fill one fourth of your plate with whole grains. Look for the word "whole" as the first word in the ingredient list. ? Fill one fourth of your plate with lean protein foods.  Eat more foods that have fiber, like apples, carrots, beans, peas, and barley.  Eat more home-cooked foods. Eat less at restaurants and buffets.  Limit or avoid alcohol.  Limit foods high in starch and sugar.  Limit fried foods.  Cook foods without frying them. Baking, boiling, grilling, and broiling are all great options.  Lose weight if you are overweight. Losing even a small amount of weight can help your overall health. It can also help prevent diseases such as diabetes and heart disease. What foods can I eat? Grains Whole grains, such as whole wheat or whole grain breads, crackers, cereals, and pasta. Unsweetened oatmeal,  bulgur, barley, quinoa, or brown rice. Corn or whole wheat flour tortillas. Vegetables Fresh or frozen vegetables (raw, steamed, roasted, or grilled). Green salads. Fruits All fresh, canned (in natural juice), or frozen fruits. Meat and Other Protein Products Ground beef (85% or leaner), grass-fed beef, or beef trimmed of fat. Skinless chicken or turkey. Ground chicken or turkey. Pork trimmed of fat. All fish and seafood. Eggs. Dried beans, peas, or lentils. Unsalted nuts or seeds. Unsalted canned or dry beans. Dairy Low-fat dairy products, such as skim or 1% milk, 2% or reduced-fat cheeses, low-fat ricotta or cottage cheese, or plain low-fat yogurt. Fats and Oils Tub margarines without trans fats. Light or reduced-fat mayonnaise and salad dressings. Avocado. Olive, canola, sesame, or safflower oils. Natural peanut or almond butter (choose ones without added sugar and oil). The items listed above may not be a complete list of recommended foods or beverages. Contact your dietitian for more options. What foods are not recommended? Grains White bread. White pasta. White rice. Cornbread. Bagels, pastries, and croissants. Crackers that contain trans fat. Vegetables White potatoes. Corn. Creamed or fried vegetables. Vegetables in a cheese sauce. Fruits Dried fruits. Canned fruit in light or heavy syrup. Fruit juice. Meat and Other Protein Products Fatty cuts of meat. Ribs, chicken wings, bacon, sausage, bologna, salami, chitterlings, fatback, hot dogs, bratwurst, and packaged luncheon meats. Liver and organ meats. Dairy Whole or 2% milk, cream, half-and-half, and cream cheese. Whole milk cheeses. Whole-fat or sweetened yogurt. Full-fat cheeses. Nondairy creamers and whipped toppings. Processed cheese, cheese spreads, or cheese curds. Sweets and Desserts Corn   syrup, sugars, honey, and molasses. Candy. Jam and jelly. Syrup. Sweetened cereals. Cookies, pies, cakes, donuts, muffins, and ice  cream. Fats and Oils Butter, stick margarine, lard, shortening, ghee, or bacon fat. Coconut, palm kernel, or palm oils. Beverages Alcohol. Sweetened drinks (such as sodas, lemonade, and fruit drinks or punches). The items listed above may not be a complete list of foods and beverages to avoid. Contact your dietitian for more information. This information is not intended to replace advice given to you by your health care provider. Make sure you discuss any questions you have with your health care provider. Document Released: 07/07/2011 Document Revised: 09/12/2015 Document Reviewed: 04/06/2013 Elsevier Interactive Patient Education  2018 Elsevier Inc.  

## 2017-08-31 NOTE — Progress Notes (Signed)
HPI  Pt presents to the clinic today to establish care and for management of the conditions listed below. He is transferring care from Dr. Lacinda Axon  DM 2: His last A1C was 7.7%, 05/2017. He is taking Jardiance as prescribed. He does not check his sugars. He checks his feet. His last eye exam was 12/2016.  HTN: His BP today is 124/82. He is taking Lisinopril as prescribed. ECG from 07/2010 reviewed.  HLD: His last LDL was 50, 05/2017. He denies myalgias on Atorvastatin. He tries to consume a low fat diet.  History of Kidney Stones: The last once occurred about 2-4 years ago. It passed on its own.  GERD: He is not sure what triggers this. He takes Tums as needed with good relief. There is no upper GI on file.  Hypotestosteronism: His last Testosterone levels were normal, 05/2017. He is not taking any supplement at this time.  Flu: never Tetanus: 01/2013 Pneumovax: 11/2016 Vision Screening: annually  Dentist: biannually  Past Medical History:  Diagnosis Date  . Chest pain   . Chicken pox   . Cholelithiasis   . Diabetes mellitus without complication ([place])   . Dysplastic nevus    sees Dr. Evorn Gong PAC [place]   . HTN (hypertension)   . Hyperlipidemia   . Kidney stones     Current Outpatient Medications  Medication Sig Dispense Refill  . atorvastatin (LIPITOR) 40 MG tablet Take 1 tablet (40 mg total) by mouth daily. 90 tablet 1  . empagliflozin (JARDIANCE) 10 MG TABS tablet Take 10 mg by mouth daily. 90 tablet 1  . glucose blood (COOL BLOOD GLUCOSE TEST STRIPS) test strip Use as instructed to check blood glucose up to three times a day, E11.29. Please dispense one touch strips per his insurance. Thanks 100 each 12  . lisinopril (PRINIVIL,ZESTRIL) 10 MG tablet Take 1 tablet (10 mg total) by mouth daily. 90 tablet 1   No current facility-administered medications for this visit.     Allergies  Allergen Reactions  . Metformin And Related Diarrhea    Family History  Problem Relation  Age of Onset  . Heart attack Mother   . Heart disease Mother   . Hyperlipidemia Mother   . Hypertension Mother   . Diabetes Mother   . Hypertension Father   . Heart disease Father   . Hyperlipidemia Father   . Diabetes Father     Social History   Socioeconomic History  . Marital status: Married    Spouse name: Not on file  . Number of children: Not on file  . Years of education: Not on file  . Highest education level: Not on file  Occupational History  . Not on file  Social Needs  . Financial resource strain: Not on file  . Food insecurity:    Worry: Not on file    Inability: Not on file  . Transportation needs:    Medical: Not on file    Non-medical: Not on file  Tobacco Use  . Smoking status: Never Smoker  . Smokeless tobacco: Current User  Substance and Sexual Activity  . Alcohol use: No  . Drug use: No  . Sexual activity: Not on file  Lifestyle  . Physical activity:    Days per week: Not on file    Minutes per session: Not on file  . Stress: Not on file  Relationships  . Social connections:    Talks on phone: Not on file    Gets together: Not on  file    Attends religious service: Not on file    Active member of club or organization: Not on file    Attends meetings of clubs or organizations: Not on file    Relationship status: Not on file  . Intimate partner violence:    Fear of current or ex partner: Not on file    Emotionally abused: Not on file    Physically abused: Not on file    Forced sexual activity: Not on file  Other Topics Concern  . Not on file  Social History Narrative   Works for [employer]    Married 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 Principal    ROS:  Constitutional: Denies fever, malaise, fatigue, headache or abrupt weight changes.  HEENT: Denies eye pain, eye redness, ear pain, ringing in the ears, wax buildup, runny nose, nasal congestion, bloody nose, or sore throat. Respiratory: Denies difficulty breathing, shortness of breath, cough or sputum  production.   Cardiovascular: Denies chest pain, chest tightness, palpitations or swelling in the hands or feet.  Gastrointestinal: Pt reports intermittent reflux. Denies abdominal pain, bloating, constipation, diarrhea or blood in the stool.  GU: Denies frequency, urgency, pain with urination, blood in urine, odor or discharge. Musculoskeletal: Denies decrease in range of motion, difficulty with gait, muscle pain or joint pain and swelling.  Skin: Denies redness, rashes, lesions or ulcercations.  Neurological: Denies dizziness, difficulty with memory, difficulty with speech or problems with balance and coordination.  Psych: Denies anxiety, depression, SI/HI.  No other specific complaints in a complete review of systems (except as listed in HPI above).  PE: BP 124/82   Pulse 72   Temp 98.4 F (36.9 C) (Oral)   Wt 234 lb (106.1 kg)   SpO2 98%   BMI 32.64 kg/m    Wt Readings from Last 3 Encounters:  05/28/17 237 lb (107.5 kg)  12/15/16 237 lb 2 oz (107.6 kg)  06/09/16 236 lb 4 oz (107.2 kg)    General: Appears his stated age, obese in NAD. Cardiovascular: Normal rate and rhythm. S1,S2 noted.  No murmur, rubs or gallops noted. No JVD or BLE edema. No carotid bruits noted. Pulmonary/Chest: Normal effort and positive vesicular breath sounds. No respiratory distress. No wheezes, rales or ronchi noted.  Abdomen: Soft and nontender. Normal bowel sounds Neurological: Alert and oriented.  Psychiatric: Mood and affect normal. Behavior is normal. Judgment and thought content normal.    BMET    Component Value Date/Time   NA 143 05/28/2017 0910   K 4.6 05/28/2017 0910   CL 103 05/28/2017 0910   CO2 21 05/28/2017 0910   GLUCOSE 174 (H) 05/28/2017 0910   GLUCOSE 167 (H) 09/26/2015 0203   BUN 9 05/28/2017 0910   CREATININE 0.99 05/28/2017 0910   CALCIUM 9.8 05/28/2017 0910   GFRNONAA 92 05/28/2017 0910   GFRAA 107 05/28/2017 0910    Lipid Panel     Component Value Date/Time    CHOL 143 05/28/2017 0910   TRIG 318 (H) 05/28/2017 0910   HDL 29 (L) 05/28/2017 0910   CHOLHDL 4.9 05/28/2017 0910   LDLCALC 50 05/28/2017 0910    CBC    Component Value Date/Time   WBC 7.2 05/28/2017 0910   WBC 8.3 09/26/2015 0203   RBC 5.78 05/28/2017 0910   RBC 5.35 09/26/2015 0203   HGB 17.4 05/28/2017 0910   HCT 48.6 05/28/2017 0910   PLT 220 05/28/2017 0910   MCV 84 05/28/2017 0910   MCH 30.1 05/28/2017  0910   MCH 30.4 09/26/2015 0203   MCHC 35.8 (H) 05/28/2017 0910   MCHC 36.3 (H) 09/26/2015 0203   RDW 15.1 05/28/2017 0910   LYMPHSABS 2.4 05/28/2017 0910   EOSABS 0.1 05/28/2017 0910   BASOSABS 0.0 05/28/2017 0910    Hgb A1C Lab Results  Component Value Date   HGBA1C 7.7 (H) 05/28/2017     Assessment and Plan:

## 2017-09-01 NOTE — Assessment & Plan Note (Signed)
Controlled on Lisinopril We will check kidney function yearly Reinforced DASH diet and exercise for weight loss

## 2017-09-01 NOTE — Assessment & Plan Note (Signed)
Not severe enough for daily reducer Encouraged weight loss Continue Tums as needed

## 2017-09-01 NOTE — Assessment & Plan Note (Signed)
Will check CMET and lipid profile annual exam Encouraged him to consume a low-fat diet Continue atorvastatin for now

## 2017-09-01 NOTE — Assessment & Plan Note (Signed)
A1c today No microalbumin secondary to ACE therapy Encouraged him to consume a low carb diet and exercise for weight loss Foot exam today Encouraged yearly eye exams He does not take flu shots but pneumonia vaccine is up-to-date

## 2017-09-01 NOTE — Assessment & Plan Note (Signed)
He declines urology referral at this time

## 2017-09-02 ENCOUNTER — Encounter: Payer: Self-pay | Admitting: Internal Medicine

## 2017-09-03 MED ORDER — EMPAGLIFLOZIN 25 MG PO TABS: 25.0000 mg | ORAL_TABLET | Freq: Every day | ORAL | 0 refills | Status: DC

## 2017-09-03 NOTE — Addendum Note (Signed)
Addended by: Lurlean Nanny on: 09/03/2017 12:21 PM   Modules accepted: Orders

## 2017-09-06 ENCOUNTER — Encounter: Payer: Self-pay | Admitting: Internal Medicine

## 2017-11-30 ENCOUNTER — Other Ambulatory Visit: Payer: Self-pay

## 2017-11-30 DIAGNOSIS — E1159 Type 2 diabetes mellitus with other circulatory complications: Secondary | ICD-10-CM

## 2017-11-30 DIAGNOSIS — I10 Essential (primary) hypertension: Principal | ICD-10-CM

## 2017-11-30 DIAGNOSIS — E785 Hyperlipidemia, unspecified: Secondary | ICD-10-CM

## 2017-11-30 NOTE — Addendum Note (Signed)
Addended by: Judie Petit on: 11/30/2017 11:36 AM   Modules accepted: Level of Service

## 2017-12-01 LAB — LIPID PANEL
CHOLESTEROL TOTAL: 140 mg/dL (ref 100–199)
Chol/HDL Ratio: 5.2 ratio — ABNORMAL HIGH (ref 0.0–5.0)
HDL: 27 mg/dL — ABNORMAL LOW (ref >39)
LDL CALC: 50 mg/dL (ref 0–99)
TRIGLYCERIDES: 314 mg/dL — AB (ref 0–149)
VLDL CHOLESTEROL CAL: 63 mg/dL — AB (ref 5–40)

## 2017-12-01 LAB — COMPREHENSIVE METABOLIC PANEL
A/G RATIO: 2 (ref 1.2–2.2)
ALK PHOS: 67 IU/L (ref 39–117)
ALT: 33 IU/L (ref 0–44)
AST: 25 IU/L (ref 0–40)
Albumin: 4.8 g/dL (ref 3.5–5.5)
BUN / CREAT RATIO: 12 (ref 9–20)
BUN: 11 mg/dL (ref 6–24)
Bilirubin Total: 1.6 mg/dL — ABNORMAL HIGH (ref 0.0–1.2)
CO2: 19 mmol/L — AB (ref 20–29)
CREATININE: 0.93 mg/dL (ref 0.76–1.27)
Calcium: 9.8 mg/dL (ref 8.7–10.2)
Chloride: 103 mmol/L (ref 96–106)
GFR calc Af Amer: 115 mL/min/{1.73_m2} (ref >59)
GFR calc non Af Amer: 99 mL/min/{1.73_m2} (ref >59)
GLOBULIN, TOTAL: 2.4 g/dL (ref 1.5–4.5)
Glucose: 165 mg/dL — ABNORMAL HIGH (ref 65–99)
POTASSIUM: 4.4 mmol/L (ref 3.5–5.2)
SODIUM: 139 mmol/L (ref 134–144)
Total Protein: 7.2 g/dL (ref 6.0–8.5)

## 2017-12-01 LAB — HGB A1C W/O EAG: Hgb A1c MFr Bld: 7.6 % — ABNORMAL HIGH (ref 4.8–5.6)

## 2017-12-01 NOTE — Progress Notes (Signed)
Lab results from 11/30/17 routed to the ordering provider Webb Silversmith NP 12/01/17 -[place]

## 2017-12-03 ENCOUNTER — Encounter: Payer: Self-pay | Admitting: Internal Medicine

## 2017-12-04 ENCOUNTER — Other Ambulatory Visit: Payer: Self-pay | Admitting: Internal Medicine

## 2017-12-04 DIAGNOSIS — E119 Type 2 diabetes mellitus without complications: Secondary | ICD-10-CM

## 2017-12-06 ENCOUNTER — Other Ambulatory Visit: Payer: Self-pay | Admitting: Internal Medicine

## 2017-12-06 ENCOUNTER — Other Ambulatory Visit: Payer: Managed Care, Other (non HMO)

## 2017-12-06 ENCOUNTER — Telehealth: Payer: Self-pay | Admitting: Internal Medicine

## 2017-12-06 MED ORDER — FENOFIBRATE 54 MG PO TABS: 54.0000 mg | ORAL_TABLET | Freq: Every day | ORAL | 2 refills | Status: DC

## 2017-12-06 MED ORDER — GLIPIZIDE ER 5 MG PO TB24: 5.0000 mg | ORAL_TABLET | Freq: Every day | ORAL | 2 refills | Status: DC

## 2017-12-06 NOTE — Telephone Encounter (Signed)
Left message on voicemail, I sent mychart msg on Friday with results

## 2017-12-06 NOTE — Telephone Encounter (Signed)
Best number [phone]  Pt came in today thinking he had an appointment with Rollene Fare, to discuss labs/  He would like a call so he can discuss them

## 2017-12-06 NOTE — Addendum Note (Signed)
Addended by: Lurlean Nanny on: 12/06/2017 11:15 AM   Modules accepted: Orders

## 2017-12-08 ENCOUNTER — Other Ambulatory Visit: Payer: Self-pay | Admitting: Internal Medicine

## 2017-12-08 DIAGNOSIS — E119 Type 2 diabetes mellitus without complications: Secondary | ICD-10-CM

## 2018-02-11 ENCOUNTER — Ambulatory Visit: Payer: Self-pay | Admitting: Adult Health

## 2018-02-11 VITALS — BP 138/76 | HR 82 | Temp 98.2°F | Resp 14

## 2018-02-11 DIAGNOSIS — E119 Type 2 diabetes mellitus without complications: Secondary | ICD-10-CM

## 2018-02-11 DIAGNOSIS — J029 Acute pharyngitis, unspecified: Secondary | ICD-10-CM

## 2018-02-11 LAB — POCT RAPID STREP A (OFFICE): Rapid Strep A Screen: NEGATIVE

## 2018-02-11 LAB — GLUCOSE, POCT (MANUAL RESULT ENTRY): POC GLUCOSE: 153 mg/dL — AB (ref 70–99)

## 2018-02-11 MED ORDER — AMOXICILLIN 875 MG PO TABS: 875.0000 mg | ORAL_TABLET | Freq: Two times a day (BID) | ORAL | 0 refills | Status: DC

## 2018-02-11 NOTE — Progress Notes (Addendum)
[facility] Subjective:     Patient ID: Jerry Reid, male   DOB: 1975/12/24, 43 y.o.   MRN: [id number]  Blood pressure 138/76, pulse 82, temperature 98.2 F (36.8 C), temperature source Oral, resp. rate 14, SpO2 97 %. HPI  Patient is a 43 year old male in no acute distress comes to clinic for cough and sore throat x 1 week. He denies any nasal or chest congestion.   Very painful swallowing. Denies any difficulty swallowing.  Mild fatigue.  Patient  denies any fever, body aches,chills, rash, chest pain, shortness of breath, nausea, vomiting, or diarrhea.   No known exposures. Has small children in elementary school.   Allergies  Allergen Reactions  . Metformin And Related Diarrhea       Review of Systems  Constitutional: Positive for fatigue. Negative for activity change, appetite change, chills, diaphoresis, fever and unexpected weight change.  HENT: Positive for sore throat. Negative for congestion, dental problem, drooling, ear discharge, ear pain, facial swelling, hearing loss, mouth sores, nosebleeds, postnasal drip, rhinorrhea, sinus pressure, sinus pain, sneezing, tinnitus, trouble swallowing and voice change.   Eyes: Negative.   Respiratory: Positive for cough. Negative for apnea, choking, chest tightness, shortness of breath, wheezing and stridor.   Cardiovascular: Negative.  Negative for chest pain, palpitations and leg swelling.  Gastrointestinal: Negative.   Endocrine: Negative for polydipsia, polyphagia and polyuria.  Genitourinary: Negative.   Musculoskeletal: Negative.   Skin: Negative.   Neurological: Negative.   Hematological: Negative.   Psychiatric/Behavioral: Negative.        Objective:   Physical Exam Vitals signs reviewed.  Constitutional:      General: He is not in acute distress.    Appearance: He is well-developed. He is not ill-appearing, toxic-appearing or diaphoretic.  HENT:     Head: Normocephalic  and atraumatic.     Right Ear: Tympanic membrane and ear canal normal. No drainage, swelling or tenderness. No middle ear effusion. Tympanic membrane is not erythematous.     Left Ear: Tympanic membrane and ear canal normal. No drainage, swelling or tenderness.  No middle ear effusion. Tympanic membrane is not erythematous.     Mouth/Throat:     Mouth: Mucous membranes are moist. No oral lesions.     Pharynx: Uvula midline. Oropharyngeal exudate (white scattered mostly on tonils bilaterally ) and posterior oropharyngeal erythema present. No pharyngeal swelling or uvula swelling.     Tonsils: Tonsillar exudate (white bilateral tonils. ) present. No tonsillar abscesses. Swelling: 2+ on the right. 2+ on the left.     Comments: No stridor,airway patent.    Eyes:     Conjunctiva/sclera: Conjunctivae normal.  Neck:     Musculoskeletal: Normal range of motion.     Thyroid: No thyromegaly.  Cardiovascular:     Rate and Rhythm: Normal rate and regular rhythm.     Heart sounds: No murmur. No friction rub. No gallop.   Pulmonary:     Effort: Pulmonary effort is normal.  Lymphadenopathy:     Cervical: Cervical adenopathy present.     Right cervical: Superficial cervical adenopathy (mild bilaterally ) present. No deep or posterior cervical adenopathy.    Left cervical: Superficial cervical adenopathy present. No deep or posterior cervical adenopathy.  Skin:    General: Skin is warm and dry.     Capillary Refill: Capillary refill takes less than 2 seconds.  Neurological:     Mental Status: He is alert and oriented to person, place,  and time.  Psychiatric:        Mood and Affect: Mood normal.        Assessment:    Sore throat - Plan: POCT Rapid Strep A-Office (CPT [phone]), Strep A DNA probe  Acute pharyngitis, unspecified etiology  Type 2 diabetes mellitus without complication, without long-term current use of insulin ([place]) - Plan: POCT Glucose (CBG)-Manual entry (CPT [phone])       Plan:        Meds ordered this encounter  Medications  . amoxicillin (AMOXIL) 875 MG tablet    Sig: Take 1 tablet (875 mg total) by mouth 2 (two) times daily.    Dispense:  20 tablet    Refill:  0   Clinical exam consistent with strep pharyngitis- will send culture.   Treatment as above.     Gave and reviewed After Visit Summary(AVS) with patient.   Advised patient call the office or your primary care doctor for an appointment if no improvement within 72 hours or if any symptoms change or worsen at any time  Advised ER or urgent Care if after hours or on weekend. Call 911 for emergency symptoms at any time.Patinet verbalized understanding of all instructions given/reviewed and treatment plan and has no further questions or concerns at this time.    Patient verbalized understanding of all instructions given and denies any further questions at this time.

## 2018-02-11 NOTE — Patient Instructions (Signed)
Pharyngitis  Pharyngitis is a sore throat (pharynx). This is when there is redness, pain, and swelling in your throat. Most of the time, this condition gets better on its own. In some cases, you may need medicine. Follow these instructions at home:  Take over-the-counter and prescription medicines only as told by your doctor. ? If you were prescribed an antibiotic medicine, take it as told by your doctor. Do not stop taking the antibiotic even if you start to feel better. ? Do not give children aspirin. Aspirin has been linked to Reye syndrome.  Drink enough water and fluids to keep your pee (urine) clear or pale yellow.  Get a lot of rest.  Rinse your mouth (gargle) with a salt-water mixture 3-4 times a day or as needed. To make a salt-water mixture, completely dissolve -1 tsp of salt in 1 cup of warm water.  If your doctor approves, you may use throat lozenges or sprays to soothe your throat. Contact a doctor if:  You have large, tender lumps in your neck.  You have a rash.  You cough up green, yellow-brown, or bloody spit. Get help right away if:  You have a stiff neck.  You drool or cannot swallow liquids.  You cannot drink or take medicines without throwing up.  You have very bad pain that does not go away with medicine.  You have problems breathing, and it is not from a stuffy nose.  You have new pain and swelling in your knees, ankles, wrists, or elbows. Summary  Pharyngitis is a sore throat (pharynx). This is when there is redness, pain, and swelling in your throat.  If you were prescribed an antibiotic medicine, take it as told by your doctor. Do not stop taking the antibiotic even if you start to feel better.  Most of the time, pharyngitis gets better on its own. Sometimes, you may need medicine. This information is not intended to replace advice given to you by your health care provider. Make sure you discuss any questions you have with your health care  provider. Document Released: 06/24/2007 Document Revised: 02/11/2016 Document Reviewed: 02/11/2016 Elsevier Interactive Patient Education  2019 Elsevier Inc.  

## 2018-02-12 ENCOUNTER — Other Ambulatory Visit: Payer: Self-pay | Admitting: Internal Medicine

## 2018-02-12 LAB — STREP A DNA PROBE: Strep Gp A Direct, DNA Probe: NEGATIVE

## 2018-02-14 NOTE — Progress Notes (Signed)
Strep test was negative you can let patient know and hopefully he is feeling much better.

## 2018-02-15 ENCOUNTER — Telehealth: Payer: Self-pay

## 2018-02-15 MED ORDER — PREDNISONE 10 MG (21) PO TBPK: ORAL_TABLET | ORAL | 0 refills | Status: DC

## 2018-02-15 NOTE — Progress Notes (Signed)
Patient ID: Jerry Reid, male   DOB: 1975/02/24, 43 y.o.   MRN: [id number]  See phone call by CMA -  Patient called denied any urgent symptoms, reports sore throat is much improved. Denied any distress or difficulty swallowing. He reports his cough is lingering from visit 02/11/17.  He agrees to office visit on 02/15/17 with provider for evaluation and instructed as per office visit follow up for any emergent symptom. He was advised in office Prednisone could be called in and is aware to monitor blood glucose.   Meds ordered this encounter  Medications  . predniSONE (STERAPRED UNI-PAK 21 TAB) 10 MG (21) TBPK tablet    Sig: PO: Take 6 tablets on day 1:Take 5 tablets day 2:Take 4 tablets day 3: Take 3 tablets day 4:Take 2 tablets day five: 5 Take 1 tablet day 6 Monitor blood sugar    Dispense:  21 tablet    Refill:  0    Instruct patient to monitor blood sugar while on medication  Prednisone called in and was instructed to start tonight. office visit on 02/16/17.

## 2018-02-15 NOTE — Addendum Note (Signed)
Addended by: Doreen Beam on: 02/15/2018 02:18 PM   Modules accepted: Orders

## 2018-02-15 NOTE — Telephone Encounter (Signed)
The patient called and stated that his sore throat is feeling better since the visit on 02/11/2018 but the cough is still persistent and would like to know if something could be prescribed to help. He would like the medication sent to the [facility] in [place] and will come in to be re-evaluated tomorrow 02/16/2018.

## 2018-02-16 ENCOUNTER — Ambulatory Visit: Payer: Self-pay | Admitting: Adult Health

## 2018-02-16 VITALS — BP 160/88 | HR 116 | Temp 98.1°F | Resp 14

## 2018-02-16 DIAGNOSIS — R05 Cough: Secondary | ICD-10-CM | POA: Insufficient documentation

## 2018-02-16 DIAGNOSIS — J029 Acute pharyngitis, unspecified: Secondary | ICD-10-CM | POA: Insufficient documentation

## 2018-02-16 DIAGNOSIS — R059 Cough, unspecified: Secondary | ICD-10-CM | POA: Insufficient documentation

## 2018-02-16 DIAGNOSIS — J209 Acute bronchitis, unspecified: Secondary | ICD-10-CM

## 2018-02-16 LAB — POCT INFLUENZA A/B
Influenza A, POC: NEGATIVE
Influenza B, POC: NEGATIVE

## 2018-02-16 MED ORDER — AZITHROMYCIN 250 MG PO TABS: ORAL_TABLET | ORAL | 0 refills | Status: DC

## 2018-02-16 MED ORDER — BENZONATATE 100 MG PO CAPS: 100.0000 mg | ORAL_CAPSULE | Freq: Two times a day (BID) | ORAL | 0 refills | Status: DC | PRN

## 2018-02-16 NOTE — Progress Notes (Signed)
[facility]  Subjective:     Patient ID: Jerry Reid, male   DOB: Jan 14, 1976, 43 y.o.   MRN: [id number] Allergies  Allergen Reactions  . Metformin And Related Diarrhea   Baity, Coralie Keens, NP  Blood pressure (!) 160/88, pulse (!) 116, temperature 98.1 F (36.7 C), temperature source Oral, resp. rate 14, SpO2 97 %. Recheck blood pressure 145/72 by provider. Heart rate resting 108 beats per minute.  HPI  Patient is a 43 year old male in no acute distress who comes to the clinic after being seen on 02/11/17 for pharyngitis and a cough with symptoms persistent for over one week at that time.  He was given Amoxicillin at that time and called on 02/15/18 to say he continues to have a tickling cough. He was given prednisone dose pack that he declined on 02/11/17  for throat irritation and possible inflammation on 1 /28/20 to start and was instructed to follow up with this office today. He was instructed to monitor his glucose.   He reports he reports he is feeling much better since starting the prednisone last night- he has taken 60 mg total. His sore throat has resolved on 5 days of Amoxicillin. He reports that he has a bronchial cough/ congestion that is not going away. He reports he had this mildly at last visit.  He reports non productive irritating cough has mildly improved since starting prednisone last pm.  Patient  denies any fever, body aches,chills, rash, chest pain, shortness of breath, nausea, vomiting, or diarrhea.   Patient Active Problem List   Diagnosis Date Noted  . Hyperlipidemia 05/08/2015  . Low testosterone 05/08/2015  . DM type 2 (diabetes mellitus, type 2) ([place]) 05/06/2015  . GERD (gastroesophageal reflux disease) 08/06/2010  . HTN (hypertension) 08/06/2010    Current Outpatient Medications:  .  amoxicillin (AMOXIL) 875 MG tablet, Take 1 tablet (875 mg total) by mouth 2 (two) times daily., Disp: 20 tablet, Rfl: 0 .  atorvastatin  (LIPITOR) 40 MG tablet, Take 1 tablet (40 mg total) by mouth daily., Disp: 90 tablet, Rfl: 1 .  azithromycin (ZITHROMAX) 250 MG tablet, By mouth Take 2 tablets day 1 (500mg  total) and 1 tablet ( 250 mg ) on days 2,3,4,5., Disp: 6 tablet, Rfl: 0 .  benzonatate (TESSALON) 100 MG capsule, Take 1 capsule (100 mg total) by mouth 2 (two) times daily as needed for cough (may cause drowsiness.)., Disp: 20 capsule, Rfl: 0 .  fenofibrate 54 MG tablet, Take 1 tablet (54 mg total) by mouth daily., Disp: 30 tablet, Rfl: 2 .  glipiZIDE (GLUCOTROL XL) 5 MG 24 hr tablet, Take 1 tablet (5 mg total) by mouth daily with breakfast., Disp: 30 tablet, Rfl: 2 .  glucose blood (COOL BLOOD GLUCOSE TEST STRIPS) test strip, Use as instructed to check blood glucose up to three times a day, E11.29. Please dispense one touch strips per his insurance. Thanks, Disp: 100 each, Rfl: 12 .  JARDIANCE 25 MG TABS tablet, TAKE 1 TABLET BY MOUTH DAILY, Disp: 90 tablet, Rfl: 0 .  JARDIANCE 25 MG TABS tablet, TAKE 1 TABLET BY MOUTH DAILY, Disp: 90 tablet, Rfl: 0 .  lisinopril (PRINIVIL,ZESTRIL) 10 MG tablet, Take 1 tablet (10 mg total) by mouth daily., Disp: 90 tablet, Rfl: 1 .  Omega-3 Fatty Acids (FISH OIL) 1000 MG CAPS, Take 1 capsule (1,000 mg total) by mouth 3 (three) times daily., Disp: , Rfl: 0 .  predniSONE (STERAPRED UNI-PAK 21  TAB) 10 MG (21) TBPK tablet, PO: Take 6 tablets on day 1:Take 5 tablets day 2:Take 4 tablets day 3: Take 3 tablets day 4:Take 2 tablets day five: 5 Take 1 tablet day 6 Monitor blood sugar, Disp: 21 tablet, Rfl: 0 Review of Systems  Constitutional: Negative.  Negative for activity change, appetite change, chills, diaphoresis, fatigue, fever and unexpected weight change.  HENT: Positive for congestion (chest ), postnasal drip and sore throat (resolved ). Negative for dental problem, drooling, ear discharge, ear pain, facial swelling, hearing loss, mouth sores, nosebleeds, rhinorrhea, sinus pressure, sinus pain,  sneezing, tinnitus and voice change.   Respiratory: Positive for cough and chest tightness (mild with congestion and cough. ). Negative for apnea, choking, shortness of breath, wheezing and stridor.   Cardiovascular: Negative.  Negative for chest pain, palpitations and leg swelling.  Gastrointestinal: Negative.   Genitourinary: Negative.   Musculoskeletal: Negative.   Skin: Negative.   Neurological: Negative.   Hematological: Negative.   Psychiatric/Behavioral: Negative.           Objective:   Physical Exam Vitals signs reviewed.  Constitutional:      General: He is not in acute distress.    Appearance: Normal appearance. He is not ill-appearing, toxic-appearing or diaphoretic.     Comments: Patient is alert and oriented and responsive to questions Engages in eye contact with provider. Speaks in full sentences without any pauses without any shortness of breath or distress.    HENT:     Head: Normocephalic and atraumatic.     Right Ear: Tympanic membrane, ear canal and external ear normal. There is no impacted cerumen.     Left Ear: Tympanic membrane, ear canal and external ear normal. There is no impacted cerumen.     Nose: Nose normal. No congestion or rhinorrhea.     Mouth/Throat:     Mouth: Mucous membranes are moist.     Pharynx: No oropharyngeal exudate or posterior oropharyngeal erythema.     Comments: No exudate, no stridor Eyes:     General: No scleral icterus.       Right eye: No discharge.        Left eye: No discharge.     Conjunctiva/sclera: Conjunctivae normal.     Pupils: Pupils are equal, round, and reactive to light.  Neck:     Musculoskeletal: Normal range of motion and neck supple. No neck rigidity or muscular tenderness.  Cardiovascular:     Rate and Rhythm: Regular rhythm. Tachycardia present.     Pulses: Normal pulses.     Heart sounds: No murmur. No friction rub. No gallop.      Comments: Mild prednisone started last night. Advised to avoid sudafed  containingmedication.  Pulmonary:     Effort: Pulmonary effort is normal. No respiratory distress.     Breath sounds: Normal breath sounds. No stridor. No wheezing, rhonchi or rales.     Comments: Bronchial cough in room. Congested cough noted in room. Chest:     Chest wall: No tenderness.  Abdominal:     General: There is no distension.     Palpations: Abdomen is soft. There is no mass.     Tenderness: There is no abdominal tenderness. There is no right CVA tenderness, left CVA tenderness, guarding or rebound.     Hernia: No hernia is present.  Musculoskeletal: Normal range of motion.  Lymphadenopathy:     Cervical: No cervical adenopathy.  Skin:    General: Skin is warm and dry.  Capillary Refill: Capillary refill takes less than 2 seconds.     Coloration: Skin is not jaundiced or pale.     Findings: No bruising, erythema, lesion or rash.  Neurological:     Mental Status: He is alert and oriented to person, place, and time. Mental status is at baseline.     Cranial Nerves: No cranial nerve deficit.     Sensory: No sensory deficit.     Motor: No weakness.     Coordination: Coordination normal.     Gait: Gait normal.     Deep Tendon Reflexes: Reflexes normal.     Comments: Patient moves on and off of exam table and in room without difficulty. Gait is normal in hall and in room. Patient is oriented to person place time and situation. Patient answers questions appropriately and engages in conversation.   Psychiatric:        Mood and Affect: Mood normal.        Behavior: Behavior normal.        Thought Content: Thought content normal.        Judgment: Judgment normal.        Assessment:     Cough - Plan: POCT Influenza A/B (POC66)  Acute bronchitis, unspecified organism  Acute pharyngitis, unspecified etiology- resolving       Plan:     Meds ordered this encounter  Medications  . azithromycin (ZITHROMAX) 250 MG tablet    Sig: By mouth Take 2 tablets day 1 (500mg   total) and 1 tablet ( 250 mg ) on days 2,3,4,5.    Dispense:  6 tablet    Refill:  0  . benzonatate (TESSALON) 100 MG capsule    Sig: Take 1 capsule (100 mg total) by mouth 2 (two) times daily as needed for cough (may cause drowsiness.).    Dispense:  20 capsule    Refill:  0   Medications Discontinued During This Encounter  Medication Reason  . amoxicillin (AMOXIL) 875 MG tablet Completed Course    Advised patient call the office or your primary care doctor for an appointment if no improvement within 72 hours or if any symptoms change or worsen at any time  Advised ER or urgent Care if after hours or on weekend. Call 911 for emergency symptoms at any time.Patinet verbalized understanding of all instructions given/reviewed and treatment plan and has no further questions or concerns at this time.    Patient verbalized understanding of all instructions given and denies any further questions at this time.

## 2018-02-16 NOTE — Patient Instructions (Addendum)
Acute Bronchitis, Adult Acute bronchitis is when air tubes (bronchi) in the lungs suddenly get swollen. The condition can make it hard to breathe. It can also cause these symptoms:  A cough.  Coughing up clear, yellow, or green mucus.  Wheezing.  Chest congestion.  Shortness of breath.  A fever.  Body aches.  Chills.  A sore throat. Follow these instructions at home:  Medicines  Take over-the-counter and prescription medicines only as told by your doctor.  If you were prescribed an antibiotic medicine, take it as told by your doctor. Do not stop taking the antibiotic even if you start to feel better. General instructions  Rest.  Drink enough fluids to keep your pee (urine) pale yellow.  Avoid smoking and secondhand smoke. If you smoke and you need help quitting, ask your doctor. Quitting will help your lungs heal faster.  Use an inhaler, cool mist vaporizer, or humidifier as told by your doctor.  Keep all follow-up visits as told by your doctor. This is important. How is this prevented? To lower your risk of getting this condition again:  Wash your hands often with soap and water. If you cannot use soap and water, use hand sanitizer.  Avoid contact with people who have cold symptoms.  Try not to touch your hands to your mouth, nose, or eyes.  Make sure to get the flu shot every year. Contact a doctor if:  Your symptoms do not get better in 2 weeks. Get help right away if:  You cough up blood.  You have chest pain.  You have very bad shortness of breath.  You become dehydrated.  You faint (pass out) or keep feeling like you are going to pass out.  You keep throwing up (vomiting).  You have a very bad headache.  Your fever or chills gets worse. This information is not intended to replace advice given to you by your health care provider. Make sure you discuss any questions you have with your health care provider. Document Released:  06/24/2007 Document Revised: 08/19/2016 Document Reviewed: 06/26/2015 Elsevier Interactive Patient Education  2019 [place]. Upper Respiratory Infection, Adult An upper respiratory infection (URI) affects the nose, throat, and upper air passages. URIs are caused by germs (viruses). The most common type of URI is often called "the common cold." Medicines cannot cure URIs, but you can do things at home to relieve your symptoms. URIs usually get better within 7-10 days. Follow these instructions at home: Activity  Rest as needed.  If you have a fever, stay home from work or school until your fever is gone, or until your doctor says you may return to work or school. ? You should stay home until you cannot spread the infection anymore (you are not contagious). ? Your doctor may have you wear a face mask so you have less risk of spreading the infection. Relieving symptoms  Gargle with a salt-water mixture 3-4 times a day or as needed. To make a salt-water mixture, completely dissolve -1 tsp of salt in 1 cup of warm water.  Use a cool-mist humidifier to add moisture to the air. This can help you breathe more easily. Eating and drinking   Drink enough fluid to keep your pee (urine) pale yellow.  Eat soups and other clear broths. General instructions   Take over-the-counter and prescription medicines only as told by your doctor. These include cold medicines, fever reducers, and cough suppressants.  Do not use any  products that contain nicotine or tobacco. These include cigarettes and e-cigarettes. If you need help quitting, ask your doctor.  Avoid being where people are smoking (avoid secondhand smoke).  Make sure you get regular shots and get the flu shot every year.  Keep all follow-up visits as told by your doctor. This is important. How to avoid spreading infection to others   Wash your hands often with soap and water. If you do not have soap and water, use hand  sanitizer.  Avoid touching your mouth, face, eyes, or nose.  Cough or sneeze into a tissue or your sleeve or elbow. Do not cough or sneeze into your hand or into the air. Contact a doctor if:  You are getting worse, not better.  You have any of these: ? A fever. ? Chills. ? Brown or red mucus in your nose. ? Yellow or brown fluid (discharge)coming from your nose. ? Pain in your face, especially when you bend forward. ? Swollen neck glands. ? Pain with swallowing. ? White areas in the back of your throat. Get help right away if:  You have shortness of breath that gets worse.  You have very bad or constant: ? Headache. ? Ear pain. ? Pain in your forehead, behind your eyes, and over your cheekbones (sinus pain). ? Chest pain.  You have long-lasting (chronic) lung disease along with any of these: ? Wheezing. ? Long-lasting cough. ? Coughing up blood. ? A change in your usual mucus.  You have a stiff neck.  You have changes in your: ? Vision. ? Hearing. ? Thinking. ? Mood. Summary  An upper respiratory infection (URI) is caused by a germ called a virus. The most common type of URI is often called "the common cold."  URIs usually get better within 7-10 days.  Take over-the-counter and prescription medicines only as told by your doctor. This information is not intended to replace advice given to you by your health care provider. Make sure you discuss any questions you have with your health care provider. Document Released: 06/24/2007 Document Revised: 08/28/2016 Document Reviewed: 08/28/2016 Elsevier Interactive Patient Education  2019 [employer]  Cough, Adult  A cough helps to clear your throat and lungs. A cough may last only 2-3 weeks (acute), or it may last longer than 8 weeks (chronic). Many different things can cause a cough. A cough may be a sign of an illness or another medical condition. Follow these instructions at home:  Pay attention to any changes in  your cough.  Take medicines only as told by your doctor. ? If you were prescribed an antibiotic medicine, take it as told by your doctor. Do not stop taking it even if you start to feel better. ? Talk with your doctor before you try using a cough medicine.  Drink enough fluid to keep your pee (urine) clear or pale yellow.  If the air is dry, use a cold steam vaporizer or humidifier in your home.  Stay away from things that make you cough at work or at home.  If your cough is worse at night, try using extra pillows to raise your head up higher while you sleep.  Do not smoke, and try not to be around smoke. If you need help quitting, ask your doctor.  Do not have caffeine.  Do not drink alcohol.  Rest as needed. Contact a doctor if:  You have new problems (symptoms).  You cough up yellow fluid (pus).  Your cough does not  get better after 2-3 weeks, or your cough gets worse.  Medicine does not help your cough and you are not sleeping well.  You have pain that gets worse or pain that is not helped with medicine.  You have a fever.  You are losing weight and you do not know why.  You have night sweats. Get help right away if:  You cough up blood.  You have trouble breathing.  Your heartbeat is very fast. This information is not intended to replace advice given to you by your health care provider. Make sure you discuss any questions you have with your health care provider. Document Released: 09/18/2010 Document Revised: 06/13/2015 Document Reviewed: 03/14/2014 Elsevier Interactive Patient Education  2019 [place]. Azithromycin tablets What is this medicine? AZITHROMYCIN (az ith roe MYE sin) is a macrolide antibiotic. It is used to treat or prevent certain kinds of bacterial infections. It will not work for colds, flu, or other viral infections. This medicine may be used for other purposes; ask your health care provider or pharmacist if you have questions. COMMON  BRAND NAME(S): Zithromax, Zithromax Tri-Pak, Zithromax Z-Pak What should I tell my health care provider before I take this medicine? They need to know if you have any of these conditions: -history of blood diseases, like leukemia -history of irregular heartbeat -kidney disease -liver disease -myasthenia gravis -an unusual or allergic reaction to azithromycin, erythromycin, other macrolide antibiotics, foods, dyes, or preservatives -pregnant or trying to get pregnant -breast-feeding How should I use this medicine? Take this medicine by mouth with a full glass of water. Follow the directions on the prescription label. The tablets can be taken with food or on an empty stomach. If the medicine upsets your stomach, take it with food. Take your medicine at regular intervals. Do not take your medicine more often than directed. Take all of your medicine as directed even if you think your are better. Do not skip doses or stop your medicine early. Talk to your pediatrician regarding the use of this medicine in children. While this drug may be prescribed for children as young as 6 months for selected conditions, precautions do apply. Overdosage: If you think you have taken too much of this medicine contact a poison control center or emergency room at once. NOTE: This medicine is only for you. Do not share this medicine with others. What if I miss a dose? If you miss a dose, take it as soon as you can. If it is almost time for your next dose, take only that dose. Do not take double or extra doses. What may interact with this medicine? Do not take this medicine with any of the following medications: -cisapride -dronedarone -pimozide -thioridazine This medicine may also interact with the following medications: -antacids that contain aluminum or magnesium -birth control pills -certain medicines for irregular heart beat like amiodarone, dofetilide, encainide, flecainide, propafenone,  quinidine -colchicine -cyclosporine -digoxin -nelfinavir -phenytoin -warfarin This list may not describe all possible interactions. Give your health care provider a list of all the medicines, herbs, non-prescription drugs, or dietary supplements you use. Also tell them if you smoke, drink alcohol, or use illegal drugs. Some items may interact with your medicine. What should I watch for while using this medicine? Tell your doctor or healthcare professional if your symptoms do not start to get better or if they get worse. Do not treat diarrhea with over the counter products. Contact your doctor if you have diarrhea that lasts more than 2 days  or if it is severe and watery. This medicine can make you more sensitive to the sun. Keep out of the sun. If you cannot avoid being in the sun, wear protective clothing and use sunscreen. Do not use sun lamps or tanning beds/booths. What side effects may I notice from receiving this medicine? Side effects that you should report to your doctor or health care professional as soon as possible: -allergic reactions like skin rash, itching or hives, swelling of the face, lips, or tongue -bloody or watery diarrhea -breathing problems -chest pain -fast, irregular heartbeat -muscle weakness -redness, blistering, peeling or loosening of the skin, including inside the mouth -signs and symptoms of liver injury like dark yellow or brown urine; general ill feeling or flu-like symptoms; light-colored stools; loss of appetite; nausea; right upper belly pain; unusually weak or tired; yellowing of the eyes or skin -white patches or sores in the mouth -unusually weak or tired Side effects that usually do not require medical attention (report to your doctor or health care professional if they continue or are bothersome): -diarrhea -nausea -stomach pain -vomiting This list may not describe all possible side effects. Call your doctor for medical advice about side effects.  You may report side effects to FDA at 1-800-FDA-1088. Where should I keep my medicine? Keep out of the reach of children. Store at room temperature between 15 and 30 degrees C (59 and 86 degrees F). Throw away any unused medicine after the expiration date. NOTE: This sheet is a summary. It may not cover all possible information. If you have questions about this medicine, talk to your doctor, pharmacist, or health care provider.  2019 Elsevier/Gold Standard (2017-05-14 15:21:46) Prednisolone tablets What is this medicine? PREDNISOLONE (pred NISS oh lone) is a corticosteroid. It is commonly used to treat inflammation of the skin, joints, lungs, and other organs. Common conditions treated include asthma, allergies, and arthritis. It is also used for other conditions, such as blood disorders and diseases of the adrenal glands. This medicine may be used for other purposes; ask your health care provider or pharmacist if you have questions. COMMON BRAND NAME(S): Millipred, Millipred DP, Millipred DP 12-Day, Millipred DP 6 Day, Prednoral What should I tell my health care provider before I take this medicine? They need to know if you have any of these conditions: -Cushing's syndrome -diabetes -glaucoma -heart problems or disease -high blood pressure -infection such as herpes, measles, tuberculosis, or chickenpox -kidney disease -liver disease -mental problems -myasthenia gravis -osteoporosis -seizures -stomach ulcer or intestine disease including colitis and diverticulitis -thyroid problem -an unusual or allergic reaction to lactose, prednisolone, other medicines, foods, dyes, or preservatives -pregnant or trying to get pregnant -breast-feeding How should I use this medicine? Take this medicine by mouth with a glass of water. Follow the directions on the prescription label. Take it with food or milk to avoid stomach upset. If you are taking this medicine once a day, take it in the morning. Do  not take more medicine than you are told to take. Do not suddenly stop taking your medicine because you may develop a severe reaction. Your doctor will tell you how much medicine to take. If your doctor wants you to stop the medicine, the dose may be slowly lowered over time to avoid any side effects. Talk to your pediatrician regarding the use of this medicine in children. Special care may be needed. Overdosage: If you think you have taken too much of this medicine contact a poison control center or  emergency room at once. NOTE: This medicine is only for you. Do not share this medicine with others. What if I miss a dose? If you miss a dose, take it as soon as you can. If it is almost time for your next dose, take only that dose. Do not take double or extra doses. What may interact with this medicine? Do not take this medicine with any of the following medications: -metyrapone -mifepristone This medicine may also interact with the following medications: -aminoglutethimide -amphotericin B -aspirin and aspirin-like medicines -barbiturates -certain medicines for diabetes, like glipizide or glyburide -cholestyramine -cholinesterase inhibitors -cyclosporine -digoxin -diuretics -ephedrine -male hormones, like estrogens and birth control pills -isoniazid -ketoconazole -NSAIDS, medicines for pain and inflammation, like ibuprofen or naproxen -phenytoin -rifampin -toxoids -vaccines -warfarin This list may not describe all possible interactions. Give your health care provider a list of all the medicines, herbs, non-prescription drugs, or dietary supplements you use. Also tell them if you smoke, drink alcohol, or use illegal drugs. Some items may interact with your medicine. What should I watch for while using this medicine? Visit your doctor or health care professional for regular checks on your progress. If you are taking this medicine over a prolonged period, carry an identification card  with your name and address, the type and dose of your medicine, and your doctor's name and address. This medicine may increase your risk of getting an infection. Tell your doctor or health care professional if you are around anyone with measles or chickenpox, or if you develop sores or blisters that do not heal properly. If you are going to have surgery, tell your doctor or health care professional that you have taken this medicine within the last twelve months. Ask your doctor or health care professional about your diet. You may need to lower the amount of salt you eat. This medicine may affect blood sugar levels. If you have diabetes, check with your doctor or health care professional before you change your diet or the dose of your diabetic medicine. What side effects may I notice from receiving this medicine? Side effects that you should report to your doctor or health care professional as soon as possible: -allergic reactions like skin rash, itching or hives, swelling of the face, lips, or tongue -changes in emotions or moods -changes in vision -eye pain -signs and symptoms of high blood sugar such as dizziness; dry mouth; dry skin; fruity breath; nausea; stomach pain; increased hunger or thirst; increased urination -signs and symptoms of infection like fever or chills; cough; sore throat; pain or trouble passing urine -slow growth in children (if used for longer periods of time) -swelling of ankles, feet -trouble sleeping -unusually weak or tired -weak bones (if used for longer periods of time) Side effects that usually do not require medical attention (report to your doctor or health care professional if they continue or are bothersome): -increased hunger -nausea -skin problems, acne, thin and shiny skin -upset stomach -weight gain This list may not describe all possible side effects. Call your doctor for medical advice about side effects. You may report side effects to FDA at  1-800-FDA-1088. Where should I keep my medicine? Keep out of the reach of children. Store at room temperature between 15 and 30 degrees C (59 and 86 degrees F). Keep container tightly closed. Throw away any unused medicine after the expiration date. NOTE: This sheet is a summary. It may not cover all possible information. If you have questions about this medicine, talk to  your doctor, pharmacist, or health care provider.  2019 Elsevier/Gold Standard (2015-02-07 12:30:30)  Acute Bronchitis, Adult Acute bronchitis is when air tubes (bronchi) in the lungs suddenly get swollen. The condition can make it hard to breathe. It can also cause these symptoms:  A cough.  Coughing up clear, yellow, or green mucus.  Wheezing.  Chest congestion.  Shortness of breath.  A fever.  Body aches.  Chills.  A sore throat. Follow these instructions at home:  Medicines  Take over-the-counter and prescription medicines only as told by your doctor.  If you were prescribed an antibiotic medicine, take it as told by your doctor. Do not stop taking the antibiotic even if you start to feel better. General instructions  Rest.  Drink enough fluids to keep your pee (urine) pale yellow.  Avoid smoking and secondhand smoke. If you smoke and you need help quitting, ask your doctor. Quitting will help your lungs heal faster.  Use an inhaler, cool mist vaporizer, or humidifier as told by your doctor.  Keep all follow-up visits as told by your doctor. This is important. How is this prevented? To lower your risk of getting this condition again:  Wash your hands often with soap and water. If you cannot use soap and water, use hand sanitizer.  Avoid contact with people who have cold symptoms.  Try not to touch your hands to your mouth, nose, or eyes.  Make sure to get the flu shot every year. Contact a doctor if:  Your symptoms do not get better in 2 weeks. Get help right away if:  You cough up  blood.  You have chest pain.  You have very bad shortness of breath.  You become dehydrated.  You faint (pass out) or keep feeling like you are going to pass out.  You keep throwing up (vomiting).  You have a very bad headache.  Your fever or chills gets worse. This information is not intended to replace advice given to you by your health care provider. Make sure you discuss any questions you have with your health care provider. Document Released: 06/24/2007 Document Revised: 08/19/2016 Document Reviewed: 06/26/2015 Elsevier Interactive Patient Education  2019 [employer].

## 2018-02-28 ENCOUNTER — Encounter: Payer: Self-pay | Admitting: Internal Medicine

## 2018-03-01 NOTE — Telephone Encounter (Signed)
Message  Received: Today  Message Contents  [place], Coralie Keens, NP  Zadia Uhde, Kelby Aline, FNP        I would like to see him first. Will place orders at visit. Thx!   Previous Messages        Patient Message  02/28/2018  Baity, [place] at [facility] Visit Summary  Diagnoses   None  Orders Signed This Visit   None  Orders Pended This Visit   None  Progress Notes

## 2018-03-02 ENCOUNTER — Other Ambulatory Visit: Payer: Self-pay

## 2018-03-10 ENCOUNTER — Other Ambulatory Visit: Payer: Self-pay | Admitting: Internal Medicine

## 2018-03-17 ENCOUNTER — Ambulatory Visit: Payer: Managed Care, Other (non HMO) | Admitting: Internal Medicine

## 2018-03-17 ENCOUNTER — Encounter: Payer: Self-pay | Admitting: Internal Medicine

## 2018-03-17 DIAGNOSIS — I10 Essential (primary) hypertension: Secondary | ICD-10-CM | POA: Diagnosis not present

## 2018-03-17 DIAGNOSIS — E119 Type 2 diabetes mellitus without complications: Secondary | ICD-10-CM

## 2018-03-17 DIAGNOSIS — E781 Pure hyperglyceridemia: Secondary | ICD-10-CM | POA: Diagnosis not present

## 2018-03-20 ENCOUNTER — Encounter: Payer: Self-pay | Admitting: Internal Medicine

## 2018-03-20 NOTE — Assessment & Plan Note (Signed)
RX for CMET and Lipid profile today Encouraged him to consume a low fat diet Continue Fenofibrate for now, consider adding statin if LDL > 100

## 2018-03-20 NOTE — Progress Notes (Signed)
Subjective:    Patient ID: Jerry Reid, male    DOB: 1975-08-16, 43 y.o.   MRN: [id number]  HPI  Pt presents to the clinic today to follow up chronic conditions.  HTN: His BP today is 120/84. He is taking Lisinopril as prescribed. ECG from 07/2010 reviewed.  DM 2: His last A1C was 7.6%, 11/2017. He does not check his sugars. He is taking Glipizide and Jardience as prescribed. His last eye exam was about 1 year ago. He checks his feet routinely. He does not take flu shots. His pneumovax is UTD.  HLD: His last LDL was 50, triglycerides 314, 11/2017. He is taking Fenofibrate as prescribed. He tries to consume a low fat diet.  Review of Systems      Past Medical History:  Diagnosis Date  . Chest pain   . Chicken pox   . Cholelithiasis   . Diabetes mellitus without complication ([place])   . Dysplastic nevus    sees Dr. Evorn Gong PAC [place]   . HTN (hypertension)   . Hyperlipidemia   . Kidney stones     Current Outpatient Medications  Medication Sig Dispense Refill  . fenofibrate 54 MG tablet TAKE 1 TABLET BY MOUTH DAILY 30 tablet 0  . glipiZIDE (GLUCOTROL XL) 5 MG 24 hr tablet TAKE 1 TABLET BY MOUTH EVERY DAY WITH BREAKFAST 30 tablet 0  . glucose blood (COOL BLOOD GLUCOSE TEST STRIPS) test strip Use as instructed to check blood glucose up to three times a day, E11.29. Please dispense one touch strips per his insurance. Thanks 100 each 12  . JARDIANCE 25 MG TABS tablet TAKE 1 TABLET BY MOUTH DAILY 90 tablet 0  . lisinopril (PRINIVIL,ZESTRIL) 10 MG tablet Take 1 tablet (10 mg total) by mouth daily. MUST SCHEDULE ANNUAL PHYSICAL 90 tablet 0   No current facility-administered medications for this visit.     Allergies  Allergen Reactions  . Metformin And Related Diarrhea    Family History  Problem Relation Age of Onset  . Heart attack Mother   . Heart disease Mother   . Hyperlipidemia Mother   . Hypertension Mother   . Diabetes Mother   . Hypertension Father   . Heart  disease Father   . Hyperlipidemia Father   . Diabetes Father     Social History   Socioeconomic History  . Marital status: Married    Spouse name: Not on file  . Number of children: Not on file  . Years of education: Not on file  . Highest education level: Not on file  Occupational History  . Not on file  Social Needs  . Financial resource strain: Not on file  . Food insecurity:    Worry: Not on file    Inability: Not on file  . Transportation needs:    Medical: Not on file    Non-medical: Not on file  Tobacco Use  . Smoking status: Never Smoker  . Smokeless tobacco: Never Used  Substance and Sexual Activity  . Alcohol use: No  . Drug use: No  . Sexual activity: Not on file  Lifestyle  . Physical activity:    Days per week: Not on file    Minutes per session: Not on file  . Stress: Not on file  Relationships  . Social connections:    Talks on phone: Not on file    Gets together: Not on file    Attends religious service: Not on file    Active member of  club or organization: Not on file    Attends meetings of clubs or organizations: Not on file    Relationship status: Not on file  . Intimate partner violence:    Fear of current or ex partner: Not on file    Emotionally abused: Not on file    Physically abused: Not on file    Forced sexual activity: Not on file  Other Topics Concern  . Not on file  Social History Narrative   Works for [employer]    Married 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 Principal     Constitutional: Denies fever, malaise, fatigue, headache or abrupt weight changes.  Respiratory: Denies difficulty breathing, shortness of breath, cough or sputum production.   Cardiovascular: Denies chest pain, chest tightness, palpitations or swelling in the hands or feet.  Skin: Denies redness, rashes, lesions or ulcercations.  Neurological: Denies dizziness, difficulty with memory, difficulty with speech or problems with balance and coordination.    No other specific  complaints in a complete review of systems (except as listed in HPI above).  Objective:   Physical Exam  BP 120/84 (BP Location: Left Arm, Patient Position: Sitting, Cuff Size: Large)   Pulse 90   Temp 97.7 F (36.5 C) (Oral)   Resp 16   Ht 5\' 11"  (1.803 m)   Wt 239 lb (108.4 kg)   SpO2 98%   BMI 33.33 kg/m  Wt Readings from Last 3 Encounters:  03/17/18 239 lb (108.4 kg)  08/31/17 234 lb (106.1 kg)  05/28/17 237 lb (107.5 kg)    General: Appears his stated age, obese, in NAD. Skin: Warm, dry and intact. No ulcerations noted. Cardiovascular: Normal rate and rhythm. S1,S2 noted.  No murmur, rubs or gallops noted. No JVD or BLE edema. No carotid bruits noted. Pulmonary/Chest: Normal effort and positive vesicular breath sounds. No respiratory distress. No wheezes, rales or ronchi noted.  Neurological: Alert and oriented. Sensation intact to BUE.   BMET    Component Value Date/Time   NA 139 11/30/2017 0835   K 4.4 11/30/2017 0835   CL 103 11/30/2017 0835   CO2 19 (L) 11/30/2017 0835   GLUCOSE 165 (H) 11/30/2017 0835   GLUCOSE 167 (H) 09/26/2015 0203   BUN 11 11/30/2017 0835   CREATININE 0.93 11/30/2017 0835   CALCIUM 9.8 11/30/2017 0835   GFRNONAA 99 11/30/2017 0835   GFRAA 115 11/30/2017 0835    Lipid Panel     Component Value Date/Time   CHOL 140 11/30/2017 0835   TRIG 314 (H) 11/30/2017 0835   HDL 27 (L) 11/30/2017 0835   CHOLHDL 5.2 (H) 11/30/2017 0835   LDLCALC 50 11/30/2017 0835    CBC    Component Value Date/Time   WBC 7.2 05/28/2017 0910   WBC 8.3 09/26/2015 0203   RBC 5.78 05/28/2017 0910   RBC 5.35 09/26/2015 0203   HGB 17.4 05/28/2017 0910   HCT 48.6 05/28/2017 0910   PLT 220 05/28/2017 0910   MCV 84 05/28/2017 0910   MCH 30.1 05/28/2017 0910   MCH 30.4 09/26/2015 0203   MCHC 35.8 (H) 05/28/2017 0910   MCHC 36.3 (H) 09/26/2015 0203   RDW 15.1 05/28/2017 0910   LYMPHSABS 2.4 05/28/2017 0910   EOSABS 0.1 05/28/2017 0910   BASOSABS 0.0  05/28/2017 0910    Hgb A1C Lab Results  Component Value Date   HGBA1C 7.6 (H) 11/30/2017            Assessment & Plan:

## 2018-03-20 NOTE — Assessment & Plan Note (Addendum)
Continue Lisinopril for now Reinforced DASH diet and exercise for weight loss RX for CBC and CMET

## 2018-03-20 NOTE — Patient Instructions (Signed)
Fat and Cholesterol Restricted Eating Plan Getting too much fat and cholesterol in your diet may cause health problems. Choosing the right foods helps keep your fat and cholesterol at normal levels. This can keep you from getting certain diseases. Your doctor may recommend an eating plan that includes:  Total fat: ______% or less of total calories a day.  Saturated fat: ______% or less of total calories a day.  Cholesterol: less than _________mg a day.  Fiber: ______g a day. What are tips for following this plan? Meal planning  At meals, divide your plate into four equal parts: ? Fill one-half of your plate with vegetables and green salads. ? Fill one-fourth of your plate with whole grains. ? Fill one-fourth of your plate with low-fat (lean) protein foods.  Eat fish that is high in omega-3 fats at least two times a week. This includes mackerel, tuna, sardines, and salmon.  Eat foods that are high in fiber, such as whole grains, beans, apples, broccoli, carrots, peas, and barley. General tips   Work with your doctor to lose weight if you need to.  Avoid: ? Foods with added sugar. ? Fried foods. ? Foods with partially hydrogenated oils.  Limit alcohol intake to no more than 1 drink a day for nonpregnant women and 2 drinks a day for men. One drink equals 12 oz of beer, 5 oz of wine, or 1 oz of hard liquor. Reading food labels  Check food labels for: ? Trans fats. ? Partially hydrogenated oils. ? Saturated fat (g) in each serving. ? Cholesterol (mg) in each serving. ? Fiber (g) in each serving.  Choose foods with healthy fats, such as: ? Monounsaturated fats. ? Polyunsaturated fats. ? Omega-3 fats.  Choose grain products that have whole grains. Look for the word "whole" as the first word in the ingredient list. Cooking  Cook foods using low-fat methods. These include baking, boiling, grilling, and broiling.  Eat more home-cooked foods. Eat at restaurants and buffets  less often.  Avoid cooking using saturated fats, such as butter, cream, palm oil, palm kernel oil, and coconut oil. Recommended foods  Fruits  All fresh, canned (in natural juice), or frozen fruits. Vegetables  Fresh or frozen vegetables (raw, steamed, roasted, or grilled). Green salads. Grains  Whole grains, such as whole wheat or whole grain breads, crackers, cereals, and pasta. Unsweetened oatmeal, bulgur, barley, quinoa, or brown rice. Corn or whole wheat flour tortillas. Meats and other protein foods  Ground beef (85% or leaner), grass-fed beef, or beef trimmed of fat. Skinless chicken or turkey. Ground chicken or turkey. Pork trimmed of fat. All fish and seafood. Egg whites. Dried beans, peas, or lentils. Unsalted nuts or seeds. Unsalted canned beans. Nut butters without added sugar or oil. Dairy  Low-fat or nonfat dairy products, such as skim or 1% milk, 2% or reduced-fat cheeses, low-fat and fat-free ricotta or cottage cheese, or plain low-fat and nonfat yogurt. Fats and oils  Tub margarine without trans fats. Light or reduced-fat mayonnaise and salad dressings. Avocado. Olive, canola, sesame, or safflower oils. The items listed above may not be a complete list of foods and beverages you can eat. Contact a dietitian for more information. Foods to avoid Fruits  Canned fruit in heavy syrup. Fruit in cream or butter sauce. Fried fruit. Vegetables  Vegetables cooked in cheese, cream, or butter sauce. Fried vegetables. Grains  White bread. White pasta. White rice. Cornbread. Bagels, pastries, and croissants. Crackers and snack foods that contain trans fat   and hydrogenated oils. Meats and other protein foods  Fatty cuts of meat. Ribs, chicken wings, bacon, sausage, bologna, salami, chitterlings, fatback, hot dogs, bratwurst, and packaged lunch meats. Liver and organ meats. Whole eggs and egg yolks. Chicken and turkey with skin. Fried meat. Dairy  Whole or 2% milk, cream,  half-and-half, and cream cheese. Whole milk cheeses. Whole-fat or sweetened yogurt. Full-fat cheeses. Nondairy creamers and whipped toppings. Processed cheese, cheese spreads, and cheese curds. Beverages  Alcohol. Sugar-sweetened drinks such as sodas, lemonade, and fruit drinks. Fats and oils  Butter, stick margarine, lard, shortening, ghee, or bacon fat. Coconut, palm kernel, and palm oils. Sweets and desserts  Corn syrup, sugars, honey, and molasses. Candy. Jam and jelly. Syrup. Sweetened cereals. Cookies, pies, cakes, donuts, muffins, and ice cream. The items listed above may not be a complete list of foods and beverages you should avoid. Contact a dietitian for more information. Summary  Choosing the right foods helps keep your fat and cholesterol at normal levels. This can keep you from getting certain diseases.  At meals, fill one-half of your plate with vegetables and green salads.  Eat high-fiber foods, like whole grains, beans, apples, carrots, peas, and barley.  Limit added sugar, saturated fats, alcohol, and fried foods. This information is not intended to replace advice given to you by your health care provider. Make sure you discuss any questions you have with your health care provider. Document Released: 07/07/2011 Document Revised: 09/08/2017 Document Reviewed: 09/22/2016 Elsevier Interactive Patient Education  2019 Elsevier Inc.   

## 2018-03-20 NOTE — Assessment & Plan Note (Signed)
RX for A1C today No microalbumin due to ACEI therapy Continue Glipizide and Jardience Encouraged him to consume a low carb diet and exercise for weight loss Advised him to make an appt for an eye exam Foot exam today. He declines flu shot today Pneumovax UTD

## 2018-03-23 ENCOUNTER — Other Ambulatory Visit: Payer: Managed Care, Other (non HMO)

## 2018-03-23 DIAGNOSIS — Z0000 Encounter for general adult medical examination without abnormal findings: Secondary | ICD-10-CM

## 2018-03-24 LAB — COMPREHENSIVE METABOLIC PANEL
ALT: 42 IU/L (ref 0–44)
AST: 30 IU/L (ref 0–40)
Albumin/Globulin Ratio: 2.1 (ref 1.2–2.2)
Albumin: 4.8 g/dL (ref 4.0–5.0)
Alkaline Phosphatase: 47 IU/L (ref 39–117)
BUN/Creatinine Ratio: 9 (ref 9–20)
BUN: 10 mg/dL (ref 6–24)
Bilirubin Total: 1.2 mg/dL (ref 0.0–1.2)
CO2: 19 mmol/L — ABNORMAL LOW (ref 20–29)
Calcium: 9.3 mg/dL (ref 8.7–10.2)
Chloride: 107 mmol/L — ABNORMAL HIGH (ref 96–106)
Creatinine, Ser: 1.08 mg/dL (ref 0.76–1.27)
GFR calc Af Amer: 95 mL/min/{1.73_m2} (ref >59)
GFR calc non Af Amer: 82 mL/min/{1.73_m2} (ref >59)
GLOBULIN, TOTAL: 2.3 g/dL (ref 1.5–4.5)
Glucose: 134 mg/dL — ABNORMAL HIGH (ref 65–99)
Potassium: 4.2 mmol/L (ref 3.5–5.2)
SODIUM: 141 mmol/L (ref 134–144)
Total Protein: 7.1 g/dL (ref 6.0–8.5)

## 2018-03-24 LAB — CBC WITH DIFFERENTIAL/PLATELET
Basophils Absolute: 0 10*3/uL (ref 0.0–0.2)
Basos: 0 %
EOS (ABSOLUTE): 0 10*3/uL (ref 0.0–0.4)
Eos: 1 %
HEMOGLOBIN: 16.9 g/dL (ref 13.0–17.7)
Hematocrit: 47.4 % (ref 37.5–51.0)
Immature Grans (Abs): 0 10*3/uL (ref 0.0–0.1)
Immature Granulocytes: 0 %
Lymphocytes Absolute: 1.3 10*3/uL (ref 0.7–3.1)
Lymphs: 28 %
MCH: 29.9 pg (ref 26.6–33.0)
MCHC: 35.7 g/dL (ref 31.5–35.7)
MCV: 84 fL (ref 79–97)
Monocytes Absolute: 0.5 10*3/uL (ref 0.1–0.9)
Monocytes: 12 %
NEUTROS PCT: 59 %
Neutrophils Absolute: 2.7 10*3/uL (ref 1.4–7.0)
Platelets: 231 10*3/uL (ref 150–450)
RBC: 5.65 x10E6/uL (ref 4.14–5.80)
RDW: 13.7 % (ref 11.6–15.4)
WBC: 4.5 10*3/uL (ref 3.4–10.8)

## 2018-03-24 LAB — HGB A1C W/O EAG: Hgb A1c MFr Bld: 6.4 % — ABNORMAL HIGH (ref 4.8–5.6)

## 2018-03-24 LAB — LIPID PANEL WITH LDL/HDL RATIO
Cholesterol, Total: 145 mg/dL (ref 100–199)
HDL: 28 mg/dL — ABNORMAL LOW (ref >39)
LDL Calculated: 80 mg/dL (ref 0–99)
LDl/HDL Ratio: 2.9 ratio (ref 0.0–3.6)
Triglycerides: 184 mg/dL — ABNORMAL HIGH (ref 0–149)
VLDL Cholesterol Cal: 37 mg/dL (ref 5–40)

## 2018-03-24 NOTE — Progress Notes (Signed)
Lab Results from 03/23/18 have been routed and faxed to the Ordering Provider -[facility] Yulia Ulrich CMA

## 2018-03-24 NOTE — Progress Notes (Signed)
Call pt:  Triglycerides still a little high, but cholesterol looks good. Blood counts, liver and kidney function are normal.  A1C down to 6.9%, no med changes at this time.

## 2018-03-25 ENCOUNTER — Telehealth: Payer: Self-pay

## 2018-03-25 ENCOUNTER — Encounter: Payer: Self-pay | Admitting: Internal Medicine

## 2018-03-25 DIAGNOSIS — Z0000 Encounter for general adult medical examination without abnormal findings: Secondary | ICD-10-CM

## 2018-03-25 NOTE — Telephone Encounter (Signed)
Pt is aware as instructed... pt states he gets his CPE completed at employee [facility] and was advised to schedule a 6 month f/u OV

## 2018-03-25 NOTE — Telephone Encounter (Signed)
Triglycerides have improved but is still a little high, cholesterol looks good. Blood counts, liver and kidney function are normal.A1C down to 6.9%, no medication changes at this time.  msg sent via [employer]

## 2018-04-06 ENCOUNTER — Other Ambulatory Visit: Payer: Self-pay | Admitting: Internal Medicine

## 2018-05-11 NOTE — Addendum Note (Signed)
Addended by: Doreen Beam on: 05/11/2018 02:05 PM   Modules accepted: Level of Service

## 2018-05-12 NOTE — Addendum Note (Signed)
Addended by: Judie Petit on: 05/12/2018 12:27 PM   Modules accepted: Level of Service

## 2018-05-22 ENCOUNTER — Other Ambulatory Visit: Payer: Self-pay | Admitting: Internal Medicine

## 2018-06-22 ENCOUNTER — Other Ambulatory Visit: Payer: Self-pay | Admitting: Internal Medicine

## 2018-06-22 DIAGNOSIS — E119 Type 2 diabetes mellitus without complications: Secondary | ICD-10-CM

## 2018-06-24 MED ORDER — EMPAGLIFLOZIN 25 MG PO TABS: 25.0000 mg | ORAL_TABLET | Freq: Every day | ORAL | 0 refills | Status: DC

## 2018-08-23 ENCOUNTER — Other Ambulatory Visit: Payer: Self-pay | Admitting: Internal Medicine

## 2018-09-03 ENCOUNTER — Other Ambulatory Visit: Payer: Self-pay | Admitting: Internal Medicine

## 2018-09-03 DIAGNOSIS — E119 Type 2 diabetes mellitus without complications: Secondary | ICD-10-CM

## 2018-09-25 ENCOUNTER — Other Ambulatory Visit: Payer: Self-pay | Admitting: Internal Medicine

## 2018-10-26 ENCOUNTER — Other Ambulatory Visit: Payer: Self-pay | Admitting: Internal Medicine

## 2018-10-26 DIAGNOSIS — E119 Type 2 diabetes mellitus without complications: Secondary | ICD-10-CM

## 2018-10-27 MED ORDER — JARDIANCE 25 MG PO TABS: 25.0000 mg | ORAL_TABLET | Freq: Every day | ORAL | 0 refills | Status: DC

## 2018-10-28 ENCOUNTER — Other Ambulatory Visit: Payer: Self-pay | Admitting: Internal Medicine

## 2018-11-14 ENCOUNTER — Ambulatory Visit: Payer: Managed Care, Other (non HMO) | Admitting: Internal Medicine

## 2018-11-15 ENCOUNTER — Other Ambulatory Visit: Payer: Self-pay

## 2018-11-15 ENCOUNTER — Encounter: Payer: Self-pay | Admitting: Internal Medicine

## 2018-11-15 ENCOUNTER — Ambulatory Visit: Payer: Managed Care, Other (non HMO) | Admitting: Internal Medicine

## 2018-11-15 VITALS — BP 120/78 | HR 84 | Temp 98.0°F | Resp 20 | Ht 71.0 in | Wt 236.0 lb

## 2018-11-15 DIAGNOSIS — Z6832 Body mass index (BMI) 32.0-32.9, adult: Secondary | ICD-10-CM | POA: Diagnosis not present

## 2018-11-15 DIAGNOSIS — Z008 Encounter for other general examination: Secondary | ICD-10-CM | POA: Diagnosis not present

## 2018-11-15 DIAGNOSIS — E6609 Other obesity due to excess calories: Secondary | ICD-10-CM | POA: Diagnosis not present

## 2018-11-15 NOTE — Progress Notes (Signed)
[facility]  Patient: Jerry Reid, DOB:1975-05-14  Subjective:  Here for Biometric Screen/brief exam Patient is a 43 year old male in no acute distress who comes to the clinic for his biometric screening and brief exam. Patient does regularly see a primary care provider, Dr. Garnette Gunner with an appointment scheduled for Thursday, has a known h/o DM - type 2, HTN and hyperlipidemia.  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  He reports he eats pretty much what he wants at present and does not exercise regularly. Encouraged to try to get more active and eat healthier.   He denies any concerns at today's visit. He reports he feels well.  Patient denies any fever, body aches,chills, rash, chest pain, shortness of breath, nausea, vomiting, abdominal pains or diarrhea.  He denies tobacco use, drug use or major alcohol use at present  Allergies - noted metformin and more intolerance as causes diarrhea, He denies any other allergies.  Medications reviewed: Current Outpatient Medications on File Prior to Visit  Medication Sig Dispense Refill  . empagliflozin (JARDIANCE) 25 MG TABS tablet Take 25 mg by mouth daily. MUST SCHEDULE PHYSICAL EXAM 30 tablet 0  . fenofibrate 54 MG tablet TAKE 1 TABLET BY MOUTH EVERY DAY 30 tablet 0  . glipiZIDE (GLUCOTROL XL) 5 MG 24 hr tablet TAKE 1 TABLET BY MOUTH EVERY DAY WITH BREAKFAST 30 tablet 0  . glucose blood (COOL BLOOD GLUCOSE TEST STRIPS) test strip Use as instructed to check blood glucose up to three times a day, E11.29. Please dispense one touch strips per his insurance. Thanks 100 each 12  . lisinopril (ZESTRIL) 10 MG tablet TAKE 1 TABLET BY MOUTH EVERY DAY 30 tablet 0   No current facility-administered medications on file prior to visit.    Does not check his blood sugars at home with any regularity.    Objective: BP 120/78 (BP Location:  Left Arm, Patient Position: Sitting, Cuff Size: Large)   Pulse 84   Temp 98 F (36.7 C) (Temporal)   Resp 20   Ht 5\' 11"  (1.803 m)   Wt 236 lb (107 kg)   SpO2 98%   BMI 32.92 kg/m   NAD,masked, obese Patient is alert and oriented and responsive to questions. Engages in eye contact with provider. Speaks in full sentences without any pauses without any shortness of breath or distress. HEENT: PERRL, EOMI, no sinus tenderness, TM's and canals clear  Neck:  supple, no TM, no anterior or posterior cervical lymphadenopathy. Heart: Regular rate and rhythmwithout murmurs rubs or gallops.  Lungs: Clearto auscultation Abd: obese, NT  Neuro:Patient moves on and off of exam table and in room without difficulty. Gait is normal.  Assessment: Biometric screen 1. Encounter for other general examination-brief biometric exam with biometric screening-this is not a full annual physical.  2. Encounter for biometric screening  3. Increased BMI/obesity  4. Chronic medical problems noted above and follows with his PCP presently    Plan:  Fasting glucose and lipids  Discussed with patient that today's visit here is a limited biometric screening visit (not a comprehensive exam or management of any chronic problems) Discussed some health issues, including healthy eating habits and importance of exercise. Encouraged to follow-up with PCP for annual comprehensive preventive and wellness care, and continued follow-up of chronic medical problems/issues as noted above and he plans to.  Also encouraged to share lab results with his PCP as  well. Questions invited and answered.   I will have the office call you on your glucose and cholesterol results when they return (if cannot be sent to your Mychart account). If you have not been contacted within 1 week please call the office.  Please call the clinic during office hours with any questions or concerns.  This biometric physical is a brief  physical and the only labs done are glucose and your lipid panel(cholesterol) and isnot a substitute for seeing a primary care provider for a complete annual physical. Please see a primary care physician for routine health maintenance, labs and full physical at least yearly and follow up as recommended by your provider. Provider also recommends if you do not have a primary care provider for patient to establish care as soon as possible .Patient may chose provider of choice. Also,  the [facility] at [phone] or web site at [email or web address] can help assist with finding a primary care doctor.  Patient verbalizes understanding that the office visit is acute care only and not a substitute for a primary care visit or for the management of chronic conditions.

## 2018-11-16 LAB — LIPID PANEL
Chol/HDL Ratio: 5.3 ratio — ABNORMAL HIGH (ref 0.0–5.0)
Cholesterol, Total: 169 mg/dL (ref 100–199)
HDL: 32 mg/dL — ABNORMAL LOW (ref >39)
LDL Chol Calc (NIH): 101 mg/dL — ABNORMAL HIGH (ref 0–99)
Triglycerides: 205 mg/dL — ABNORMAL HIGH (ref 0–149)
VLDL Cholesterol Cal: 36 mg/dL (ref 5–40)

## 2018-11-16 LAB — GLUCOSE, RANDOM: Glucose: 105 mg/dL — ABNORMAL HIGH (ref 65–99)

## 2018-11-16 NOTE — Progress Notes (Signed)
Sharing note that was sent to patient via [employer].  Jerry Reid,  Attached are your lab results. The glucose was slightly elevated at 105. The lipid panel was abnormal, with the Triglycerides, LDL-cholesterol (lousy type), and the Chol/HDL-cholesterol ratio all elevated (above the desired range) and the HDL-cholesterol (healthy type) low (below the desired range). You noted you are currently seeing your PCP who has been following and managing your diabetes and increased lipids this Thursday and I would share these results with your PCP to help with management recommendations.  Trying to eat a healthy diet and increasing physical activity levels are important to try to help.   Dr. Roxan Hockey

## 2018-11-16 NOTE — Progress Notes (Signed)
Reviewed lab results and recommendations with patient.

## 2018-11-17 ENCOUNTER — Ambulatory Visit: Payer: Managed Care, Other (non HMO) | Admitting: Internal Medicine

## 2018-11-17 ENCOUNTER — Other Ambulatory Visit: Payer: Self-pay

## 2018-11-17 ENCOUNTER — Encounter: Payer: Self-pay | Admitting: Internal Medicine

## 2018-11-17 VITALS — BP 122/80 | HR 76 | Temp 97.9°F | Wt 240.0 lb

## 2018-11-17 DIAGNOSIS — E119 Type 2 diabetes mellitus without complications: Secondary | ICD-10-CM

## 2018-11-17 DIAGNOSIS — I10 Essential (primary) hypertension: Secondary | ICD-10-CM | POA: Diagnosis not present

## 2018-11-17 DIAGNOSIS — E781 Pure hyperglyceridemia: Secondary | ICD-10-CM

## 2018-11-17 LAB — COMPREHENSIVE METABOLIC PANEL
ALT: 30 U/L (ref 0–53)
AST: 18 U/L (ref 0–37)
Albumin: 4.8 g/dL (ref 3.5–5.2)
Alkaline Phosphatase: 40 U/L (ref 39–117)
BUN: 13 mg/dL (ref 6–23)
CO2: 25 mEq/L (ref 19–32)
Calcium: 9.5 mg/dL (ref 8.4–10.5)
Chloride: 104 mEq/L (ref 96–112)
Creatinine, Ser: 1 mg/dL (ref 0.40–1.50)
GFR: 80.55 mL/min (ref >60.00)
Glucose, Bld: 173 mg/dL — ABNORMAL HIGH (ref 70–99)
Potassium: 4.2 mEq/L (ref 3.5–5.1)
Sodium: 136 mEq/L (ref 135–145)
Total Bilirubin: 1.1 mg/dL (ref 0.2–1.2)
Total Protein: 7 g/dL (ref 6.0–8.3)

## 2018-11-17 LAB — HEMOGLOBIN A1C: Hgb A1c MFr Bld: 6.3 % (ref 4.6–6.5)

## 2018-11-17 MED ORDER — FENOFIBRATE 67 MG PO CAPS: 67.0000 mg | ORAL_CAPSULE | Freq: Every day | ORAL | 2 refills | Status: DC

## 2018-11-17 NOTE — Progress Notes (Signed)
Subjective:    Patient ID: Jerry Reid, male    DOB: 1975/11/11, 43 y.o.   MRN: [id number]  HPI  Pt presents to the clinic today for 3 month follow up of chronic conditions.  HTN: His BP today is 122/80. He is taking Lisinopril as prescribed. He denies cough. ECG from 07/2010 reviewed.  DM 2: His last A1C was 6.4, 03/2018. His is not checking his sugars at home. He is taking Glipizide and Jardience as prescribed. He checks his feet intermittently. He has not had his eye exam in the last year. Flu never. 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  HLD: His last LDL was 101, triglycerides 205, 10/2018. He is taking Fenofibrate as prescribed. He is trying to consume a low fat diet.  Review of Systems  Past Medical History:  Diagnosis Date  . Chest pain   . Chicken pox   . Cholelithiasis   . Diabetes mellitus without complication ([place])   . Dysplastic nevus    sees Dr. Evorn Gong PAC [place]   . HTN (hypertension)   . Hyperlipidemia   . Kidney stones     Current Outpatient Medications  Medication Sig Dispense Refill  . empagliflozin (JARDIANCE) 25 MG TABS tablet Take 25 mg by mouth daily. MUST SCHEDULE PHYSICAL EXAM 30 tablet 0  . fenofibrate 54 MG tablet TAKE 1 TABLET BY MOUTH EVERY DAY 30 tablet 0  . glipiZIDE (GLUCOTROL XL) 5 MG 24 hr tablet TAKE 1 TABLET BY MOUTH EVERY DAY WITH BREAKFAST 30 tablet 0  . glucose blood (COOL BLOOD GLUCOSE TEST STRIPS) test strip Use as instructed to check blood glucose up to three times a day, E11.29. Please dispense one touch strips per his insurance. Thanks 100 each 12  . lisinopril (ZESTRIL) 10 MG tablet TAKE 1 TABLET BY MOUTH EVERY DAY 30 tablet 0   No current facility-administered medications for this visit.     Allergies  Allergen Reactions  . Metformin And Related Diarrhea    Family History  Problem Relation Age of Onset  . Heart attack Mother   . Heart disease Mother   . Hyperlipidemia Mother   . Hypertension Mother   . Diabetes Mother   .  Hypertension Father   . Heart disease Father   . Hyperlipidemia Father   . Diabetes Father     Social History   Socioeconomic History  . Marital status: Married    Spouse name: Not on file  . Number of children: Not on file  . Years of education: Not on file  . Highest education level: Not on file  Occupational History  . Not on file  Social Needs  . Financial resource strain: Not on file  . Food insecurity    Worry: Not on file    Inability: Not on file  . Transportation needs    Medical: Not on file    Non-medical: Not on file  Tobacco Use  . Smoking status: Never Smoker  . Smokeless tobacco: Never Used  Substance and Sexual Activity  . Alcohol use: No  . Drug use: No  . Sexual activity: Not on file  Lifestyle  . Physical activity    Days per week: Not on file    Minutes per session: Not on file  . Stress: Not on file  Relationships  . Social Herbalist on phone: Not on file    Gets together: Not on file    Attends religious service: Not on file    Active member  of club or organization: Not on file    Attends meetings of clubs or organizations: Not on file    Relationship status: Not on file  . Intimate partner violence    Fear of current or ex partner: Not on file    Emotionally abused: Not on file    Physically abused: Not on file    Forced sexual activity: Not on file  Other Topics Concern  . Not on file  Social History Narrative   Works for [employer]    Married 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 Principal     Constitutional: Denies fever, malaise, fatigue, headache or abrupt weight changes.  HEENT: Denies eye pain, eye redness, ear pain, ringing in the ears, wax buildup, runny nose, nasal congestion, bloody nose, or sore throat. Respiratory: Denies difficulty breathing, shortness of breath, cough or sputum production.   Cardiovascular: Denies chest pain, chest tightness, palpitations or swelling in the hands or feet.  Gastrointestinal: Pt reports increased  thirst.Denies abdominal pain, bloating, constipation, diarrhea or blood in the stool.  GU: Denies urgency, frequency, pain with urination, burning sensation, blood in urine, odor or discharge. Skin: Denies redness, rashes, lesions or ulcercations.  Neurological: Denies dizziness, difficulty with memory, difficulty with speech or problems with balance and coordination.    No other specific complaints in a complete review of systems (except as listed in HPI above).     Objective:   Physical Exam  BP 122/80   Pulse 76   Temp 97.9 F (36.6 C) (Temporal)   Wt 240 lb (108.9 kg)   SpO2 98%   BMI 33.47 kg/m  Wt Readings from Last 3 Encounters:  11/17/18 240 lb (108.9 kg)  11/15/18 236 lb (107 kg)  03/17/18 239 lb (108.4 kg)    General: Appears his stated age, obese, in NAD. Skin: Warm, dry and intact. No ulcerations noted. Cardiovascular: Normal rate and rhythm. S1,S2 noted.  No murmur, rubs or gallops noted. No JVD or BLE edema. No carotid bruits noted. Pulmonary/Chest: Normal effort and positive vesicular breath sounds. No respiratory distress. No wheezes, rales or ronchi noted.  Neurological: Alert and oriented. Sensation intact to BLE.   BMET    Component Value Date/Time   NA 141 03/23/2018 0935   K 4.2 03/23/2018 0935   CL 107 (H) 03/23/2018 0935   CO2 19 (L) 03/23/2018 0935   GLUCOSE 105 (H) 11/15/2018 1451   GLUCOSE 167 (H) 09/26/2015 0203   BUN 10 03/23/2018 0935   CREATININE 1.08 03/23/2018 0935   CALCIUM 9.3 03/23/2018 0935   GFRNONAA 82 03/23/2018 0935   GFRAA 95 03/23/2018 0935    Lipid Panel     Component Value Date/Time   CHOL 169 11/15/2018 1451   TRIG 205 (H) 11/15/2018 1451   HDL 32 (L) 11/15/2018 1451   CHOLHDL 5.3 (H) 11/15/2018 1451   LDLCALC 101 (H) 11/15/2018 1451    CBC    Component Value Date/Time   WBC 4.5 03/23/2018 0935   WBC 8.3 09/26/2015 0203   RBC 5.65 03/23/2018 0935   RBC 5.35 09/26/2015 0203   HGB 16.9 03/23/2018 0935   HCT  47.4 03/23/2018 0935   PLT 231 03/23/2018 0935   MCV 84 03/23/2018 0935   MCH 29.9 03/23/2018 0935   MCH 30.4 09/26/2015 0203   MCHC 35.7 03/23/2018 0935   MCHC 36.3 (H) 09/26/2015 0203   RDW 13.7 03/23/2018 0935   LYMPHSABS 1.3 03/23/2018 0935   EOSABS 0.0 03/23/2018 0935   BASOSABS  0.0 03/23/2018 0935    Hgb A1C Lab Results  Component Value Date   HGBA1C 6.4 (H) 03/23/2018            Assessment & Plan:

## 2018-11-17 NOTE — Assessment & Plan Note (Signed)
Controlled on Lisinopril Reinforced DASH diet and exercise for weight loss CMET today

## 2018-11-17 NOTE — Assessment & Plan Note (Signed)
A1C today No urine microalbumin secondary to ACEI therapy Encouraged him to consume a low carb diet and exercise for weight loss Continue Glipizide and Jardiance Foot exam today Encouraged him to make an appt for his eye exam- he declines referral to ophthalmology at this time He declines flu shot today Pneumovax UTD

## 2018-11-17 NOTE — Assessment & Plan Note (Signed)
CMET today Recent Lipid profile reviewed Increase Fenofibrate to 67 mg daily Encouraged him to consume a low fat diet  Will repeat lipid in 3 months, lab only

## 2018-11-17 NOTE — Patient Instructions (Signed)

## 2018-12-21 ENCOUNTER — Other Ambulatory Visit: Payer: Self-pay | Admitting: Internal Medicine

## 2018-12-22 MED ORDER — GLIPIZIDE ER 5 MG PO TB24: 5.0000 mg | ORAL_TABLET | Freq: Every day | ORAL | 1 refills | Status: DC

## 2018-12-22 MED ORDER — LISINOPRIL 10 MG PO TABS: 10.0000 mg | ORAL_TABLET | Freq: Every day | ORAL | 1 refills | Status: DC

## 2019-02-16 ENCOUNTER — Other Ambulatory Visit: Payer: Self-pay | Admitting: Internal Medicine

## 2019-02-16 DIAGNOSIS — E119 Type 2 diabetes mellitus without complications: Secondary | ICD-10-CM

## 2019-03-13 ENCOUNTER — Other Ambulatory Visit: Payer: Self-pay | Admitting: Internal Medicine

## 2019-05-12 ENCOUNTER — Other Ambulatory Visit: Payer: Self-pay | Admitting: Internal Medicine

## 2019-05-12 DIAGNOSIS — E119 Type 2 diabetes mellitus without complications: Secondary | ICD-10-CM

## 2019-05-25 ENCOUNTER — Other Ambulatory Visit: Payer: Self-pay

## 2019-05-25 ENCOUNTER — Ambulatory Visit: Payer: Managed Care, Other (non HMO) | Admitting: Nurse Practitioner

## 2019-05-25 VITALS — BP 136/92 | HR 76 | Resp 18 | Ht 68.0 in | Wt 240.0 lb

## 2019-05-25 DIAGNOSIS — L237 Allergic contact dermatitis due to plants, except food: Secondary | ICD-10-CM

## 2019-05-25 MED ORDER — PREDNISONE 20 MG PO TABS: 40.0000 mg | ORAL_TABLET | Freq: Every day | ORAL | 0 refills | Status: DC

## 2019-05-25 MED ORDER — HYDROXYZINE PAMOATE 25 MG PO CAPS: 25.0000 mg | ORAL_CAPSULE | Freq: Three times a day (TID) | ORAL | 0 refills | Status: DC | PRN

## 2019-05-25 MED ORDER — FAMOTIDINE 20 MG PO TABS: 20.0000 mg | ORAL_TABLET | Freq: Two times a day (BID) | ORAL | 0 refills | Status: DC

## 2019-05-25 NOTE — Patient Instructions (Addendum)
Contact Dermatitis Dermatitis is redness, soreness, and swelling (inflammation) of the skin. Contact dermatitis is a reaction to something that touches the skin. There are two types of contact dermatitis:  Irritant contact dermatitis. This happens when something bothers (irritates) your skin, like soap.  Allergic contact dermatitis. This is caused when you are exposed to something that you are allergic to, such as poison ivy. What are the causes?  Common causes of irritant contact dermatitis include: ? Makeup. ? Soaps. ? Detergents. ? Bleaches. ? Acids. ? Metals, such as nickel.  Common causes of allergic contact dermatitis include: ? Plants. ? Chemicals. ? Jewelry. ? Latex. ? Medicines. ? Preservatives in products, such as clothing. What increases the risk?  Having a job that exposes you to things that bother your skin.  Having asthma or eczema. What are the signs or symptoms? Symptoms may happen anywhere the irritant has touched your skin. Symptoms include:  Dry or flaky skin.  Redness.  Cracks.  Itching.  Pain or a burning feeling.  Blisters.  Blood or clear fluid draining from skin cracks. With allergic contact dermatitis, swelling may occur. This may happen in places such as the eyelids, mouth, or genitals. How is this treated?  This condition is treated by checking for the cause of the reaction and protecting your skin. Treatment may also include: ? Steroid creams, ointments, or medicines. ? Antibiotic medicines or other ointments, if you have a skin infection. ? Lotion or medicines to help with itching. ? A bandage (dressing). Follow these instructions at home: Skin care  Moisturize your skin as needed.  Put cool cloths on your skin.  Put a baking soda paste on your skin. Stir water into baking soda until it looks like a paste.  Do not scratch your skin.  Avoid having things rub up against your skin.  Avoid the use of soaps, perfumes, and  dyes. Medicines  Take or apply over-the-counter and prescription medicines only as told by your doctor.  If you were prescribed an antibiotic medicine, take or apply it as told by your doctor. Do not stop using it even if your condition starts to get better. Bathing  Take a bath with: ? Epsom salts. ? Baking soda. ? Colloidal oatmeal.  Bathe less often.  Bathe in warm water. Avoid using hot water. Bandage care  If you were given a bandage, change it as told by your health care provider.  Wash your hands with soap and water before and after you change your bandage. If soap and water are not available, use hand sanitizer. General instructions  Avoid the things that caused your reaction. If you do not know what caused it, keep a journal. Write down: ? What you eat. ? What skin products you use. ? What you drink. ? What you wear in the area that has symptoms. This includes jewelry.  Check the affected areas every day for signs of infection. Check for: ? More redness, swelling, or pain. ? More fluid or blood. ? Warmth. ? Pus or a bad smell.  Keep all follow-up visits as told by your doctor. This is important. Contact a doctor if:  You do not get better with treatment.  Your condition gets worse.  You have signs of infection, such as: ? More swelling. ? Tenderness. ? More redness. ? Soreness. ? Warmth.  You have a fever.  You have new symptoms. Get help right away if:  You have a very bad headache.  You have neck pain.  Your neck is stiff.  You throw up (vomit).  You feel very sleepy.  You see red streaks coming from the area.  Your bone or joint near the area hurts after the skin has healed.  The area turns darker.  You have trouble breathing. Summary  Dermatitis is redness, soreness, and swelling of the skin.  Symptoms may occur where the irritant has touched you.  Treatment may include medicines and skin care.  If you do not know what caused  your reaction, keep a journal.  Contact a doctor if your condition gets worse or you have signs of infection. This information is not intended to replace advice given to you by your health care provider. Make sure you discuss any questions you have with your health care provider. Document Revised: 04/27/2018 Document Reviewed: 07/21/2017 Elsevier Patient Education  [place] Dermatitis Poison ivy dermatitis is redness and soreness of the skin caused by chemicals in the leaves of the poison ivy plant. You may have very bad itching, swelling, a rash, and blisters. What are the causes?  Touching a poison ivy plant.  Touching something that has the chemical on it. This may include animals or objects that have come in contact with the plant. What increases the risk?  Going outdoors often in wooded or [place] areas.  Going outdoors without wearing protective clothing, such as closed shoes, long pants, and a long-sleeved shirt. What are the signs or symptoms?   Skin redness.  Very bad itching.  A rash that often includes bumps and blisters. ? The rash usually appears 48 hours after exposure, if you have been exposed before. ? If this is the first time you have been exposed, the rash may not appear until a week after exposure.  Swelling. This may occur if the reaction is very bad. Symptoms usually last for 1-2 weeks. The first time you develop this condition, symptoms may last 3-4 weeks. How is this treated? This condition may be treated with:  Hydrocortisone cream or calamine lotion to relieve itching.  Oatmeal baths to soothe the skin.  Medicines, such as over-the-counter antihistamine tablets.  Oral steroid medicine for more severe reactions. Follow these instructions at home: Medicines  Take or apply over-the-counter and prescription medicines only as told by your doctor.  Use hydrocortisone cream or calamine lotion as needed to help with  itching. General instructions  Do not scratch or rub your skin.  Put a cold, wet cloth (cold compress) on the affected areas or take baths in cool water. This will help with itching.  Avoid hot baths and showers.  Take oatmeal baths as needed. Use colloidal oatmeal. You can get this at a pharmacy or grocery store. Follow the instructions on the package.  While you have the rash, wash your clothes right after you wear them.  Keep all follow-up visits as told by your health care provider. This is important. How is this prevented?   Know what poison ivy looks like, so you can avoid it. ? This plant has three leaves with flowering branches on a single stem. ? The leaves are glossy. ? The leaves have uneven edges that come to a point at the front.  If you touch poison ivy, wash your skin with soap and water right away. Be sure to wash under your fingernails.  When hiking or camping, wear long pants, a long-sleeved shirt, tall socks, and hiking boots. You can also use a lotion on your skin that helps  to prevent contact with poison ivy.  If you think that your clothes or outdoor gear came in contact with poison ivy, rinse them off with a garden hose before you bring them inside your house.  When doing yard work or gardening, wear gloves, long sleeves, long pants, and boots. Wash your garden tools and gloves if they come in contact with poison ivy.  If you think that your pet has come into contact with poison ivy, wash him or her with pet shampoo and water. Make sure to wear gloves while washing your pet. Contact a doctor if:  You have open sores in the rash area.  You have more redness, swelling, or pain in the rash area.  You have redness that spreads beyond the rash area.  You have fluid, blood, or pus coming from the rash area.  You have a fever.  You have a rash over a large area of your body.  You have a rash on your eyes, mouth, or genitals.  Your rash does not get  better after a few weeks. Get help right away if:  Your face swells or your eyes swell shut.  You have trouble breathing.  You have trouble swallowing. These symptoms may be an emergency. Do not wait to see if the symptoms will go away. Get medical help right away. Call your local emergency services (911 in the [place] Do not drive yourself to the hospital. Summary  Poison ivy dermatitis is redness and soreness of the skin caused by chemicals in the leaves of the poison ivy plant.  You may have skin redness, very bad itching, swelling, and a rash.  Do not scratch or rub your skin.  Take or apply over-the-counter and prescription medicines only as told by your doctor. This information is not intended to replace advice given to you by your health care provider. Make sure you discuss any questions you have with your health care provider. Document Revised: 04/29/2018 Document Reviewed: 12/31/2017 Elsevier Patient Education  2020 [employer].

## 2019-05-25 NOTE — Progress Notes (Signed)
Subjective:     Patient ID: Jerry Reid, male   DOB: [birth year], 44 y.o.   MRN: [id number]  BP (!) 136/92 (BP Location: Left Arm, Patient Position: Sitting, Cuff Size: Normal)   Pulse 76   Resp 18   Ht 5\' 8"  (1.727 m)   Wt 240 lb (108.9 kg)   BMI 36.49 kg/m    PMH: DM, type II and HTN  Medications: Jardiance, Lofibra, Gluctrol XL, Zestril Allergies: Metformin  Jerry Reid is a 44 y.o. male with hx of DM type II and HTN who presents to the [facility] with c/o a rash. The rash started 6 days ago and has gotten worse. The rash is located on the right hand, fingers and wrist. There are a few area that he has noted on his left hand but not nearly as bad as the right. He is left hand dominant. He describes the rash as blisters with redness surrounding them. The rash does itch. He reports using OTC cream to help dry the blisters. He has not taken any PO medications to treat the symptoms. Employee reports that the rash started after he helped a neighbor take down a deer stand and clean up a wooded area. He reports having had poison ivy in the past.   Rash This is a new problem. The current episode started in the past 7 days. The problem has been gradually worsening since onset. The affected locations include the right hand, right wrist and left hand. The rash is characterized by itchiness, redness and blistering. He was exposed to plant contact. Pertinent negatives include no facial edema, fever, shortness of breath or sore throat. Past treatments include anti-itch cream. The treatment provided no relief.     Review of Systems  Constitutional: Negative for fever.  HENT: Negative for facial swelling, sore throat and trouble swallowing.   Respiratory: Negative for shortness of breath.   Skin: Positive for rash.  All other systems reviewed and are negative.      Objective:   Physical Exam Constitutional:      Appearance: Normal appearance.  HENT:     Head: Normocephalic.     Nose: Nose normal.      Mouth/Throat:     Mouth: Mucous membranes are moist.     Pharynx: Oropharynx is clear.  Eyes:     Conjunctiva/sclera: Conjunctivae normal.  Cardiovascular:     Rate and Rhythm: Normal rate.  Pulmonary:     Effort: Pulmonary effort is normal. No respiratory distress.  Musculoskeletal:        General: Normal range of motion.     Cervical back: Neck supple.     Comments: Vesicular rash with surrounding erythema to the right wrist, dorsum of hand and between fingers. No red streaking drainage or signs of infection. Left wrist with a few lesions noted.   Skin:    Findings: Rash present. Rash is vesicular.  Neurological:     Mental Status: He is alert.  Psychiatric:        Mood and Affect: Mood normal.        Behavior: Behavior normal.        Assessment:    44 y.o. male with c/o rash and itching bilateral wrist/hands. Stable for d/c without SOB, difficulty swallowing or signs of infection.     Plan:    Discussed with the patient Dx and plan of care and all questions answered. Rx Pepcid, Short course of prednisone and Vistaril.Discussed that the prednisone can cause his blood  sugar to be elevated and to keep close check.  Discussed that the vistaril for itching may make him sleepy and to take the first dose at night and if it makes him sleepy do not take while working.  He will f/u with his PCP or RTC as needed.

## 2019-06-14 ENCOUNTER — Other Ambulatory Visit: Payer: Self-pay | Admitting: Internal Medicine

## 2019-07-03 ENCOUNTER — Telehealth: Payer: Self-pay | Admitting: Internal Medicine

## 2019-07-03 MED ORDER — GLIPIZIDE ER 5 MG PO TB24: 5.0000 mg | ORAL_TABLET | Freq: Every day | ORAL | 0 refills | Status: DC

## 2019-07-03 MED ORDER — LISINOPRIL 10 MG PO TABS: 10.0000 mg | ORAL_TABLET | Freq: Every day | ORAL | 0 refills | Status: DC

## 2019-07-03 NOTE — Telephone Encounter (Signed)
Patient called today requesting a refill on his medications He stated he was not able to go through his my chart to do so.    Patient is needing the LISINOPRIL & GLIPIZIDE. Patient took his last Lisinopril today      [facility] [place]

## 2019-07-06 ENCOUNTER — Other Ambulatory Visit: Payer: Self-pay

## 2019-07-06 ENCOUNTER — Encounter: Payer: Self-pay | Admitting: Internal Medicine

## 2019-07-06 ENCOUNTER — Ambulatory Visit: Payer: Managed Care, Other (non HMO) | Admitting: Internal Medicine

## 2019-07-06 VITALS — BP 130/80 | HR 78 | Temp 97.8°F | Wt 238.0 lb

## 2019-07-06 DIAGNOSIS — E119 Type 2 diabetes mellitus without complications: Secondary | ICD-10-CM | POA: Diagnosis not present

## 2019-07-06 DIAGNOSIS — E781 Pure hyperglyceridemia: Secondary | ICD-10-CM | POA: Diagnosis not present

## 2019-07-06 DIAGNOSIS — I10 Essential (primary) hypertension: Secondary | ICD-10-CM | POA: Diagnosis not present

## 2019-07-06 DIAGNOSIS — K219 Gastro-esophageal reflux disease without esophagitis: Secondary | ICD-10-CM | POA: Diagnosis not present

## 2019-07-06 MED ORDER — COOL BLOOD GLUCOSE TEST STRIPS VI STRP: ORAL_STRIP | 12 refills | Status: DC

## 2019-07-06 NOTE — Assessment & Plan Note (Signed)
A1c today No urine microalbumin secondary to ACEI therapy Encouraged him to consume a low carb diet and exercise for weight loss Continue Glipizide and Jardiance Encourage routine eye exam Encouraged routine foot exam Encouraged him to get a flu shot in the fall Pneumovax and Covid UTD

## 2019-07-06 NOTE — Patient Instructions (Signed)
Fat and Cholesterol Restricted Eating Plan Getting too much fat and cholesterol in your diet may cause health problems. Choosing the right foods helps keep your fat and cholesterol at normal levels. This can keep you from getting certain diseases. Your doctor may recommend an eating plan that includes:  Total fat: ______% or less of total calories a day.  Saturated fat: ______% or less of total calories a day.  Cholesterol: less than _________mg a day.  Fiber: ______g a day. What are tips for following this plan? Meal planning  At meals, divide your plate into four equal parts: ? Fill one-half of your plate with vegetables and green salads. ? Fill one-fourth of your plate with whole grains. ? Fill one-fourth of your plate with low-fat (lean) protein foods.  Eat fish that is high in omega-3 fats at least two times a week. This includes mackerel, tuna, sardines, and salmon.  Eat foods that are high in fiber, such as whole grains, beans, apples, broccoli, carrots, peas, and barley. General tips   Work with your doctor to lose weight if you need to.  Avoid: ? Foods with added sugar. ? Fried foods. ? Foods with partially hydrogenated oils.  Limit alcohol intake to no more than 1 drink a day for nonpregnant women and 2 drinks a day for men. One drink equals 12 oz of beer, 5 oz of wine, or 1 oz of hard liquor. Reading food labels  Check food labels for: ? Trans fats. ? Partially hydrogenated oils. ? Saturated fat (g) in each serving. ? Cholesterol (mg) in each serving. ? Fiber (g) in each serving.  Choose foods with healthy fats, such as: ? Monounsaturated fats. ? Polyunsaturated fats. ? Omega-3 fats.  Choose grain products that have whole grains. Look for the word "whole" as the first word in the ingredient list. Cooking  Cook foods using low-fat methods. These include baking, boiling, grilling, and broiling.  Eat more home-cooked foods. Eat at restaurants and buffets  less often.  Avoid cooking using saturated fats, such as butter, cream, palm oil, palm kernel oil, and coconut oil. Recommended foods  Fruits  All fresh, canned (in natural juice), or frozen fruits. Vegetables  Fresh or frozen vegetables (raw, steamed, roasted, or grilled). Green salads. Grains  Whole grains, such as whole wheat or whole grain breads, crackers, cereals, and pasta. Unsweetened oatmeal, bulgur, barley, quinoa, or brown rice. Corn or whole wheat flour tortillas. Meats and other protein foods  Ground beef (85% or leaner), grass-fed beef, or beef trimmed of fat. Skinless chicken or turkey. Ground chicken or turkey. Pork trimmed of fat. All fish and seafood. Egg whites. Dried beans, peas, or lentils. Unsalted nuts or seeds. Unsalted canned beans. Nut butters without added sugar or oil. Dairy  Low-fat or nonfat dairy products, such as skim or 1% milk, 2% or reduced-fat cheeses, low-fat and fat-free ricotta or cottage cheese, or plain low-fat and nonfat yogurt. Fats and oils  Tub margarine without trans fats. Light or reduced-fat mayonnaise and salad dressings. Avocado. Olive, canola, sesame, or safflower oils. The items listed above may not be a complete list of foods and beverages you can eat. Contact a dietitian for more information. Foods to avoid Fruits  Canned fruit in heavy syrup. Fruit in cream or butter sauce. Fried fruit. Vegetables  Vegetables cooked in cheese, cream, or butter sauce. Fried vegetables. Grains  White bread. White pasta. White rice. Cornbread. Bagels, pastries, and croissants. Crackers and snack foods that contain trans fat   and hydrogenated oils. Meats and other protein foods  Fatty cuts of meat. Ribs, chicken wings, bacon, sausage, bologna, salami, chitterlings, fatback, hot dogs, bratwurst, and packaged lunch meats. Liver and organ meats. Whole eggs and egg yolks. Chicken and turkey with skin. Fried meat. Dairy  Whole or 2% milk, cream,  half-and-half, and cream cheese. Whole milk cheeses. Whole-fat or sweetened yogurt. Full-fat cheeses. Nondairy creamers and whipped toppings. Processed cheese, cheese spreads, and cheese curds. Beverages  Alcohol. Sugar-sweetened drinks such as sodas, lemonade, and fruit drinks. Fats and oils  Butter, stick margarine, lard, shortening, ghee, or bacon fat. Coconut, palm kernel, and palm oils. Sweets and desserts  Corn syrup, sugars, honey, and molasses. Candy. Jam and jelly. Syrup. Sweetened cereals. Cookies, pies, cakes, donuts, muffins, and ice cream. The items listed above may not be a complete list of foods and beverages you should avoid. Contact a dietitian for more information. Summary  Choosing the right foods helps keep your fat and cholesterol at normal levels. This can keep you from getting certain diseases.  At meals, fill one-half of your plate with vegetables and green salads.  Eat high-fiber foods, like whole grains, beans, apples, carrots, peas, and barley.  Limit added sugar, saturated fats, alcohol, and fried foods. This information is not intended to replace advice given to you by your health care provider. Make sure you discuss any questions you have with your health care provider. Document Revised: 09/08/2017 Document Reviewed: 09/22/2016 Elsevier Patient Education  2020 Elsevier Inc.  

## 2019-07-06 NOTE — Assessment & Plan Note (Signed)
Controlled on Lisinopril C met today Reinforced DASH diet and exercise for weight loss 

## 2019-07-06 NOTE — Assessment & Plan Note (Signed)
Currently not an issue °We will monitor °

## 2019-07-06 NOTE — Assessment & Plan Note (Signed)
C met and lipid profile today Continue Fenofibrate Encouraged him to consume a low-fat

## 2019-07-06 NOTE — Progress Notes (Signed)
Subjective:    Patient ID: Jerry Reid, male    DOB: 1975/07/10, 44 y.o.   MRN: [id number]  HPI  Patient presents the clinic today for follow-up of chronic conditions.  HTN: His BP today is 130/80.  He is taking Lisinopril as prescribed.  ECG from 07/2010 reviewed.  GERD: Currently not an issue. There is no upper GI on file.  HLD: His last LDL was 101, triglycerides 205, 10/2018.  He is taking Fenofibrate as prescribed.  He does not consume a low-fat diet.  DM2: His last A1c was 6.3%, 10/2018.  He is taking Glipizide and Jardiance as prescribed.  He is not currently checking his   He does not check his feet routinely.  His last eye exam was more than  year ago.  Flu never.  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  Covid 03/2019, 04/2019.  Review of Systems      Past Medical History:  Diagnosis Date  . Chest pain   . Chicken pox   . Cholelithiasis   . Diabetes mellitus without complication ([place])   . Dysplastic nevus    sees Dr. Evorn Gong PAC [place]   . HTN (hypertension)   . Hyperlipidemia   . Kidney stones     Current Outpatient Medications  Medication Sig Dispense Refill  . empagliflozin (JARDIANCE) 25 MG TABS tablet Take 25 mg by mouth daily. MUST SCHEDULE FOLLOW UP 90 tablet 0  . famotidine (PEPCID) 20 MG tablet Take 1 tablet (20 mg total) by mouth 2 (two) times daily. 30 tablet 0  . fenofibrate micronized (LOFIBRA) 67 MG capsule Take 1 capsule (67 mg total) by mouth daily before breakfast. SCHEDULE VISIT 30 capsule 0  . glipiZIDE (GLUCOTROL XL) 5 MG 24 hr tablet Take 1 tablet (5 mg total) by mouth daily with breakfast. 90 tablet 0  . glucose blood (COOL BLOOD GLUCOSE TEST STRIPS) test strip Use as instructed to check blood glucose up to three times a day, E11.29. Please dispense one touch strips per his insurance. Thanks 100 each 12  . hydrOXYzine (VISTARIL) 25 MG capsule Take 1 capsule (25 mg total) by mouth 3 (three) times daily as needed (itching). 30 capsule 0  . lisinopril (ZESTRIL) 10  MG tablet Take 1 tablet (10 mg total) by mouth daily. 90 tablet 0  . predniSONE (DELTASONE) 20 MG tablet Take 2 tablets (40 mg total) by mouth daily with breakfast. 10 tablet 0   No current facility-administered medications for this visit.    Allergies  Allergen Reactions  . Metformin And Related Diarrhea    Family History  Problem Relation Age of Onset  . Heart attack Mother   . Heart disease Mother   . Hyperlipidemia Mother   . Hypertension Mother   . Diabetes Mother   . Hypertension Father   . Heart disease Father   . Hyperlipidemia Father   . Diabetes Father     Social History   Socioeconomic History  . Marital status: Married    Spouse name: Not on file  . Number of children: Not on file  . Years of education: Not on file  . Highest education level: Not on file  Occupational History  . Not on file  Tobacco Use  . Smoking status: Never Smoker  . Smokeless tobacco: Never Used  Substance and Sexual Activity  . Alcohol use: No  . Drug use: No  . Sexual activity: Not on file  Other Topics Concern  . 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 Difficulty of Paying Living Expenses:   Food Insecurity:   . Worried About Charity fundraiser in the Last Year:   . Arboriculturist in the Last Year:   Transportation Needs:   . Film/video editor (Medical):   Marland Kitchen Lack of Transportation (Non-Medical):   Physical Activity:   . Days of Exercise per Week:   . Minutes of Exercise per Session:   Stress:   . Feeling of Stress :   Social Connections:   . Frequency of Communication with Friends and Family:   . Frequency of Social Gatherings with Friends and Family:   . Attends Religious Services:   . Active Member of Clubs or Organizations:   . Attends Archivist Meetings:   Marland Kitchen Marital Status:   Intimate Partner Violence:   . Fear of  Current or Ex-Partner:   . Emotionally Abused:   Marland Kitchen Physically Abused:   . Sexually Abused:      Constitutional: Denies fever, malaise, fatigue, headache or abrupt weight changes.  HEENT: Pt reports visual changes. Denies eye pain, eye redness, ear pain, ringing in the ears, wax buildup, runny nose, nasal congestion, bloody nose, or sore throat. Respiratory: Denies difficulty breathing, shortness of breath, cough or sputum production.   Cardiovascular: Denies chest pain, chest tightness, palpitations or swelling in the hands or feet.  Skin: Denies redness, rashes, lesions or ulcercations.  Neurological: Denies numbness or tingling in the lower extremities or problems with balance and coordination.    No other specific complaints in a complete review of systems (except as listed in HPI above).  Objective:   Physical Exam  BP 130/80   Pulse 78   Temp 97.8 F (36.6 C) (Temporal)   Wt 238 lb (108 kg)   SpO2 98%   BMI 36.19 kg/m   Wt Readings from Last 3 Encounters:  05/25/19 240 lb (108.9 kg)  11/17/18 240 lb (108.9 kg)  11/15/18 236 lb (107 kg)    General: Appears his stated age, obese, in NAD. Skin: Warm, dry and intact. No ulcerations noted. HEENT: Head: normal shape and size; Eyes: sclera white, no icterus, conjunctiva pink, PERRLA and EOMs intact;  Cardiovascular: Normal rate and rhythm. S1,S2 noted.  No murmur, rubs or gallops noted. No JVD or BLE edema. No carotid bruits noted. Pulmonary/Chest: Normal effort and positive vesicular breath sounds. No respiratory distress. No wheezes, rales or ronchi noted.  Musculoskeletal: No difficulty with gait.  Neurological: Alert and oriented.     BMET    Component Value Date/Time   NA 136 11/17/2018 0913   NA 141 03/23/2018 0935   K 4.2 11/17/2018 0913   CL 104 11/17/2018 0913   CO2 25 11/17/2018 0913   GLUCOSE 173 (H) 11/17/2018 0913   BUN 13 11/17/2018 0913   BUN 10 03/23/2018 0935   CREATININE 1.00 11/17/2018 0913    CALCIUM 9.5 11/17/2018 0913   GFRNONAA 82 03/23/2018 0935   GFRAA 95 03/23/2018 0935    Lipid Panel     Component Value Date/Time   CHOL 169 11/15/2018 1451   TRIG 205 (H) 11/15/2018 1451   HDL 32 (L) 11/15/2018 1451   CHOLHDL 5.3 (H) 11/15/2018 1451   LDLCALC 101 (H) 11/15/2018 1451    CBC    Component Value Date/Time   WBC 4.5 03/23/2018  0935   WBC 8.3 09/26/2015 0203   RBC 5.65 03/23/2018 0935   RBC 5.35 09/26/2015 0203   HGB 16.9 03/23/2018 0935   HCT 47.4 03/23/2018 0935   PLT 231 03/23/2018 0935   MCV 84 03/23/2018 0935   MCH 29.9 03/23/2018 0935   MCH 30.4 09/26/2015 0203   MCHC 35.7 03/23/2018 0935   MCHC 36.3 (H) 09/26/2015 0203   RDW 13.7 03/23/2018 0935   LYMPHSABS 1.3 03/23/2018 0935   EOSABS 0.0 03/23/2018 0935   BASOSABS 0.0 03/23/2018 0935    Hgb A1C Lab Results  Component Value Date   HGBA1C 6.3 11/17/2018           Assessment & Plan:    Webb Silversmith, NP This visit occurred during the SARS-CoV-2 public health emergency.  Safety protocols were in place, including screening questions prior to the visit, additional usage of staff PPE, and extensive cleaning of exam room while observing appropriate contact time as indicated for disinfecting solutions.

## 2019-07-18 ENCOUNTER — Other Ambulatory Visit: Payer: Self-pay | Admitting: Internal Medicine

## 2019-07-19 ENCOUNTER — Other Ambulatory Visit: Payer: Self-pay

## 2019-07-19 ENCOUNTER — Other Ambulatory Visit: Payer: Managed Care, Other (non HMO)

## 2019-07-19 ENCOUNTER — Ambulatory Visit: Payer: Managed Care, Other (non HMO) | Admitting: Physician Assistant

## 2019-07-19 VITALS — BP 127/85 | HR 84 | Temp 98.0°F | Resp 16 | Ht 68.0 in | Wt 237.0 lb

## 2019-07-19 VITALS — BP 127/85 | HR 84 | Temp 98.6°F | Resp 16 | Ht 68.0 in | Wt 237.0 lb

## 2019-07-19 DIAGNOSIS — Z008 Encounter for other general examination: Secondary | ICD-10-CM | POA: Diagnosis not present

## 2019-07-19 DIAGNOSIS — E119 Type 2 diabetes mellitus without complications: Secondary | ICD-10-CM

## 2019-07-19 DIAGNOSIS — I10 Essential (primary) hypertension: Secondary | ICD-10-CM

## 2019-07-19 DIAGNOSIS — E781 Pure hyperglyceridemia: Secondary | ICD-10-CM

## 2019-07-19 NOTE — Addendum Note (Signed)
Addended by: Jay Schlichter D on: 07/19/2019 10:23 AM   Modules accepted: Orders

## 2019-07-19 NOTE — Progress Notes (Signed)
   Subjective:    Patient ID: Jerry Reid, male    DOB: [birth year], 44 y.o.   MRN: [id number]  HPI    Review of Systems     Objective:   Physical Exam        Assessment & Plan:

## 2019-07-19 NOTE — Progress Notes (Signed)
Subjective:    Patient ID: Jerry Reid, male    DOB: Sep 26, 1975, 44 y.o.   MRN: [id number]  HPI  44 yo M reports for Biometric labs and exam added for completeness. Feels well , doing well.  Has not lost any weight.  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  Lipid profile /Glucose per Biometrics  Reports  Covic Vaccine  2  Review of Systems    Ophthalmology overdue 2019 - notes some visual change and will schedule Objective:   Physical Exam Vitals and nursing note reviewed.  Constitutional:      Appearance: Normal appearance. He is obese.     Comments: 5"8"     238     36 BMI  HENT:     Head: Normocephalic and atraumatic.     Right Ear: Tympanic membrane, ear canal and external ear normal.     Left Ear: Tympanic membrane, ear canal and external ear normal.     Nose: Nose normal.     Mouth/Throat:     Mouth: Mucous membranes are moist.     Comments: DDS q 6 months Eyes:     Extraocular Movements: Extraocular movements intact.  Cardiovascular:     Rate and Rhythm: Normal rate and regular rhythm.     Pulses: Normal pulses.     Heart sounds: Normal heart sounds.  Pulmonary:     Effort: Pulmonary effort is normal.     Breath sounds: Normal breath sounds.  Abdominal:     General: Bowel sounds are normal.     Palpations: Abdomen is soft.     Tenderness: There is no abdominal tenderness. There is no guarding.  Genitourinary:    Comments: Deferred. Denies concerns  Musculoskeletal:        General: Normal range of motion.     Cervical back: Normal range of motion and neck supple.  Skin:    General: Skin is warm.     Capillary Refill: Capillary refill takes less  than 2 seconds.  Neurological:     General: No focal deficit present.     Mental Status: He is alert.     Cranial Nerves: No cranial nerve deficit.     Deep Tendon Reflexes: Reflexes normal.  Psychiatric:        Mood and Affect: Mood normal.        Behavior: Behavior normal.       Assessment & Plan:  Discussed need for serious plan for weight loss to benefit DM2/HTN/Lipids. Approx 40 pounds overweight was wake-up call for patient.   Uses Fast [employer] frequently when OTJ and encouraged to get nutritional info sheet and review for usual sites. Salads with grilled chicken and clear salad dressings set forth as a goal. Everything we eat DOES matter- reviewed. Google 1800-2000 cal ADA For serving sizes and healthy food choices  Daily 30 minute brisk walk with slightly elevated pulse and respirations reviewed. Encourage it before dinner in the evening- wife is home and it can be good couples communication time- just keep pace up. Appetite frequently diminishes after exercise- great tool before dinner !   Questions fielded, recommendations reviewed.  Labs will be reported via PCP as well and intervention if indicated per his direction.

## 2019-07-20 LAB — COMPREHENSIVE METABOLIC PANEL
ALT: 36 IU/L (ref 0–44)
AST: 20 IU/L (ref 0–40)
Albumin/Globulin Ratio: 2 (ref 1.2–2.2)
Albumin: 4.6 g/dL (ref 4.0–5.0)
Alkaline Phosphatase: 48 IU/L (ref 48–121)
BUN/Creatinine Ratio: 14 (ref 9–20)
BUN: 15 mg/dL (ref 6–24)
Bilirubin Total: 0.9 mg/dL (ref 0.0–1.2)
CO2: 19 mmol/L — ABNORMAL LOW (ref 20–29)
Calcium: 9.5 mg/dL (ref 8.7–10.2)
Chloride: 103 mmol/L (ref 96–106)
Creatinine, Ser: 1.05 mg/dL (ref 0.76–1.27)
GFR calc Af Amer: 98 mL/min/{1.73_m2} (ref >59)
GFR calc non Af Amer: 85 mL/min/{1.73_m2} (ref >59)
Globulin, Total: 2.3 g/dL (ref 1.5–4.5)
Glucose: 166 mg/dL — ABNORMAL HIGH (ref 65–99)
Potassium: 4.4 mmol/L (ref 3.5–5.2)
Sodium: 139 mmol/L (ref 134–144)
Total Protein: 6.9 g/dL (ref 6.0–8.5)

## 2019-07-20 LAB — LIPID PANEL
Chol/HDL Ratio: 6.5 ratio — ABNORMAL HIGH (ref 0.0–5.0)
Cholesterol, Total: 182 mg/dL (ref 100–199)
HDL: 28 mg/dL — ABNORMAL LOW (ref >39)
LDL Chol Calc (NIH): 78 mg/dL (ref 0–99)
Triglycerides: 478 mg/dL — ABNORMAL HIGH (ref 0–149)
VLDL Cholesterol Cal: 76 mg/dL — ABNORMAL HIGH (ref 5–40)

## 2019-07-20 LAB — HGB A1C W/O EAG: Hgb A1c MFr Bld: 6.5 % — ABNORMAL HIGH (ref 4.8–5.6)

## 2019-07-25 ENCOUNTER — Telehealth: Payer: Self-pay | Admitting: Physician Assistant

## 2019-07-25 NOTE — Telephone Encounter (Signed)
Error --  See previous note  LWLee PAC

## 2019-07-25 NOTE — Telephone Encounter (Signed)
Spoke with Jerry Reid about recent labs Triglycerides 478  (previously 200s) was off medications x 3 weeks, by hx does not follow low fat diet Random glucose was actually fasting- 166, glucosuria 3+ A1C was 6.5  I was present for Biometrics and brief exam and we talked at that time, but I am not his PCP and  He will contact Webb Silversmith NP for recommendations.  Alonza Smoker [place] PA-C

## 2019-08-12 ENCOUNTER — Other Ambulatory Visit: Payer: Self-pay | Admitting: Internal Medicine

## 2019-08-12 DIAGNOSIS — E119 Type 2 diabetes mellitus without complications: Secondary | ICD-10-CM

## 2019-08-26 ENCOUNTER — Other Ambulatory Visit: Payer: Self-pay

## 2019-08-26 ENCOUNTER — Emergency Department (HOSPITAL_COMMUNITY): Payer: Managed Care, Other (non HMO)

## 2019-08-26 ENCOUNTER — Emergency Department (HOSPITAL_COMMUNITY)
Admission: EM | Admit: 2019-08-26 | Discharge: 2019-08-26 | Disposition: A | Discharge status: Home / Self Care | Payer: Managed Care, Other (non HMO) | Attending: Emergency Medicine | Admitting: Emergency Medicine

## 2019-08-26 ENCOUNTER — Encounter (HOSPITAL_COMMUNITY): Payer: Self-pay

## 2019-08-26 DIAGNOSIS — E119 Type 2 diabetes mellitus without complications: Secondary | ICD-10-CM | POA: Insufficient documentation

## 2019-08-26 DIAGNOSIS — Y998 Other external cause status: Secondary | ICD-10-CM | POA: Diagnosis not present

## 2019-08-26 DIAGNOSIS — Y30XXXA Falling, jumping or pushed from a high place, undetermined intent, initial encounter: Secondary | ICD-10-CM | POA: Diagnosis not present

## 2019-08-26 DIAGNOSIS — Z23 Encounter for immunization: Secondary | ICD-10-CM | POA: Diagnosis not present

## 2019-08-26 DIAGNOSIS — I10 Essential (primary) hypertension: Secondary | ICD-10-CM | POA: Diagnosis not present

## 2019-08-26 DIAGNOSIS — S81832A Puncture wound without foreign body, left lower leg, initial encounter: Secondary | ICD-10-CM | POA: Diagnosis not present

## 2019-08-26 DIAGNOSIS — Y9389 Activity, other specified: Secondary | ICD-10-CM | POA: Diagnosis not present

## 2019-08-26 DIAGNOSIS — S81812A Laceration without foreign body, left lower leg, initial encounter: Secondary | ICD-10-CM | POA: Diagnosis not present

## 2019-08-26 DIAGNOSIS — Y9289 Other specified places as the place of occurrence of the external cause: Secondary | ICD-10-CM | POA: Diagnosis not present

## 2019-08-26 DIAGNOSIS — S71112A Laceration without foreign body, left thigh, initial encounter: Secondary | ICD-10-CM | POA: Diagnosis present

## 2019-08-26 MED ORDER — LIDOCAINE HCL (PF) 1 % IJ SOLN
30.0000 mL | Freq: Once | INTRAMUSCULAR | Status: AC
  Administered 2019-08-26: 30 mL
  Filled 2019-08-26: qty 30

## 2019-08-26 MED ORDER — CEPHALEXIN 500 MG PO CAPS
500.0000 mg | ORAL_CAPSULE | Freq: Three times a day (TID) | ORAL | 0 refills | Status: AC
Start: 2019-08-26 — End: 2019-08-31

## 2019-08-26 MED ORDER — OXYCODONE HCL 5 MG PO TABS: 5.0000 mg | ORAL_TABLET | ORAL | 0 refills | Status: DC | PRN

## 2019-08-26 MED ORDER — TETANUS-DIPHTH-ACELL PERTUSSIS 5-2.5-18.5 LF-MCG/0.5 IM SUSP
0.5000 mL | Freq: Once | INTRAMUSCULAR | Status: AC
  Administered 2019-08-26: 0.5 mL via INTRAMUSCULAR
  Filled 2019-08-26: qty 0.5

## 2019-08-26 MED ORDER — HYDROMORPHONE HCL 1 MG/ML IJ SOLN
0.5000 mg | Freq: Once | INTRAMUSCULAR | Status: AC
  Administered 2019-08-26: 0.5 mg via INTRAVENOUS
  Filled 2019-08-26: qty 1

## 2019-08-26 NOTE — ED Notes (Signed)
Patient given discharge instructions. Questions were answered. Patient verbalized understanding of discharge instructions and care at home.  

## 2019-08-26 NOTE — Discharge Instructions (Addendum)
You were evaluated in the Emergency Department and after careful evaluation, we did not find any emergent condition requiring admission or further testing in the hospital.  Your exam/testing today was overall reassuring.  We repaired your wound here in the emergency department.  You will need to have your stitches removed in 10 to 14 days by a healthcare professional.  Please take the Keflex antibiotic as directed to prevent infection.  We recommend Tylenol or Motrin at home for discomfort.  For pain keeping you from sleeping at night, you can use the oxycodone medication for more significant pain.  Please return to the Emergency Department if you experience any worsening of your condition.  Thank you for allowing [place] to be a part of your care.

## 2019-08-26 NOTE — ED Triage Notes (Signed)
Patient arrives via ems from home due to an animal inflicted laceration. Per ems, the patient was checking on his bore when it charged him and jammed his tusk into his left leg. Patient applied pressure on his leg on the scene and called ems.   EMS noted that patient had a ~1inch in width and depth laceration to his left thigh. Pressure dressing was applied en route. Patient rates pain a 5/10.

## 2019-08-26 NOTE — ED Provider Notes (Signed)
[name] Emergency Department Provider Note MRN:  [id number]  Arrival date & time: 08/26/19     Chief Complaint   Laceration   History of Present Illness   Jerry Reid is a 44 y.o. year-old male with a history of diabetes presenting to the ED with chief complaint of laceration.  Patient was tending to his pigs, when a boar charged him and he was struck in the left thigh by one of the boar's husks.  He was able to run to safety and jump over the fence and landed on the ground.  No head trauma, no loss of consciousness, no nausea or vomiting, no neck or back pain, no chest pain or shortness of breath, no abdominal pain.  Endorsing isolated pain surrounding the laceration site caused by the hospital.  Pain is mild to moderate, constant, worse with motion or palpation.  Review of Systems  A complete 10 system review of systems was obtained and all systems are negative except as noted in the HPI and PMH.   Patient's Health History    Past Medical History:  Diagnosis Date  . Chest pain   . Chicken pox   . Cholelithiasis   . Diabetes mellitus without complication ([place])   . Dysplastic nevus    sees Dr. Evorn Gong PAC [place]   . HTN (hypertension)   . Hyperlipidemia   . Kidney stones     Past Surgical History:  Procedure Laterality Date  . NO PAST SURGERIES      Family History  Problem Relation Age of Onset  . Heart attack Mother   . Heart disease Mother   . Hyperlipidemia Mother   . Hypertension Mother   . Diabetes Mother   . Hypertension Father   . Heart disease Father   . Hyperlipidemia Father   . Diabetes Father     Social History   Socioeconomic History  . Marital status: Married    Spouse name: Not on file  . Number of children: Not on file  . Years of education: Not on file  . Highest education level: Not on file  Occupational History  . Not on file  Tobacco Use  . Smoking status: Never Smoker  . Smokeless tobacco: Never Used    Substance and Sexual Activity  . Alcohol use: No  . Drug use: No  . Sexual activity: Not on file  Other Topics Concern  . 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 Difficulty of Paying Living Expenses:   Food Insecurity:   . Worried About Charity fundraiser in the Last Year:   . Arboriculturist in the Last Year:   Transportation Needs:   . Film/video editor (Medical):   Marland Kitchen Lack of Transportation (Non-Medical):   Physical Activity:   . Days of Exercise per Week:   . Minutes of Exercise per Session:   Stress:   . Feeling of Stress :   Social Connections:   . Frequency of Communication with Friends and Family:   . Frequency of Social Gatherings with Friends and Family:   . Attends Religious Services:   . Active Member of Clubs or Organizations:   . Attends Archivist Meetings:   Marland Kitchen Marital Status:   Intimate Partner Violence:   . Fear of Current or Ex-Partner:   .  Emotionally Abused:   Marland Kitchen Physically Abused:   . Sexually Abused:      Physical Exam   Vitals:   08/26/19 1204  BP: 113/76  Pulse: 72  Resp: 18  Temp: 98.7 F (37.1 C)  SpO2: 100%    CONSTITUTIONAL: Well-appearing, NAD NEURO:  Alert and oriented x 3, no focal deficits EYES:  eyes equal and reactive ENT/NECK:  no LAD, no JVD CARDIO: Regular rate, well-perfused, normal S1 and S2 PULM:  CTAB no wheezing or rhonchi GI/GU:  normal bowel sounds, non-distended, non-tender MSK/SPINE:  No gross deformities, no edema SKIN: Irregular circular puncture wound to the anterior left thigh, hemostatic, tender to palpation PSYCH:  Appropriate speech and behavior  *Additional and/or pertinent findings included in MDM below  Diagnostic and Interventional Summary    EKG Interpretation  Date/Time:    Ventricular Rate:    PR Interval:    QRS Duration:   QT Interval:    QTC  Calculation:   R Axis:     Text Interpretation:        Labs Reviewed - No data to display  DG Femur Min 2 Views Left  Final Result      Medications  lidocaine (PF) (XYLOCAINE) 1 % injection 30 mL (has no administration in time range)  Tdap (BOOSTRIX) injection 0.5 mL (0.5 mLs Intramuscular Given 08/26/19 1305)  HYDROmorphone (DILAUDID) injection 0.5 mg (0.5 mg Intravenous Given 08/26/19 1304)     Procedures  /  Critical Care .Marland KitchenLaceration Repair  Date/Time: 08/26/2019 2:02 PM Performed by: Maudie Flakes, MD Authorized by: Maudie Flakes, MD   Consent:    Consent obtained:  Verbal   Consent given by:  Patient   Risks discussed:  Infection, pain, poor cosmetic result, poor wound healing, vascular damage, tendon damage and retained foreign body Anesthesia (see MAR for exact dosages):    Anesthesia method:  Local infiltration   Local anesthetic:  Lidocaine 1% w/o epi Laceration details:    Location:  Leg   Leg location:  L upper leg   Length (cm):  7.6   Depth (mm):  40 Repair type:    Repair type:  Complex Pre-procedure details:    Preparation:  Patient was prepped and draped in usual sterile fashion Exploration:    Limited defect created (wound extended): yes     Hemostasis achieved with:  Direct pressure   Wound exploration: wound explored through full range of motion and entire depth of wound probed and visualized     Contaminated: no   Treatment:    Area cleansed with:  Saline   Amount of cleaning:  Standard   Debridement:  Minimal   Undermining:  Minimal Subcutaneous repair:    Suture size:  4-0   Suture material:  Vicryl   Suture technique:  Simple interrupted   Number of sutures:  4 Skin repair:    Repair method:  Sutures   Suture size:  3-0   Suture material:  Nylon   Suture technique:  Simple interrupted   Number of sutures:  7 Approximation:    Approximation:  Close Post-procedure details:    Dressing:  Antibiotic ointment and sterile dressing    Patient tolerance of procedure:  Tolerated well, no immediate complications Comments:     Circular wound extended to oval-shaped with debridement of the damaged edges of skin.    ED Course and Medical Decision Making  I have reviewed the triage vital signs, the nursing notes, and pertinent available records  from the EMR.  Listed above are laboratory and imaging tests that I personally ordered, reviewed, and interpreted and then considered in my medical decision making (see below for details).      Penetrating trauma to the left thigh due to boar husk, seems to be an isolated trauma, denies head trauma or loss of consciousness, protecting airway, bilateral breath sounds, strong DP pulses bilaterally.  The wound is overall smaller and seems fairly superficial, still need to anesthetize the area and do some washing and probing.  Will obtain x-ray to exclude fracture and/or foreign body, will update tetanus.  Currently I doubt a significant vascular injury but will reassess the need for more advanced imaging after closer exam.  X-ray is reassuring, after the application of lidocaine to the site the wound was thoroughly probed and washed and there was no sign of vascular injury, fairly deep wound but does not appear to extend beyond the subcutaneous tissues.  Laceration repaired as described above, explained the possibility of infection and stressed the importance of returning to the emergency department with any significant changes.  Appropriate for discharge.  Barth Kirks. Sedonia Small, [place] [email or web address]  Final Clinical Impressions(s) / ED Diagnoses     ICD-10-CM   1. Laceration of left lower extremity, initial encounter  S81.812A   2. Penetrating injury of lower extremity, left, initial encounter  S81.832A     ED Discharge Orders         Ordered    cephALEXin (KEFLEX) 500 MG capsule  3 times daily     Discontinue  Reprint     08/26/19  1400    oxyCODONE (ROXICODONE) 5 MG immediate release tablet  Every 4 hours PRN     Discontinue  Reprint     08/26/19 1400           Discharge Instructions Discussed with and Provided to Patient:     Discharge Instructions     You were evaluated in the Emergency Department and after careful evaluation, we did not find any emergent condition requiring admission or further testing in the hospital.  Your exam/testing today was overall reassuring.  We repaired your wound here in the emergency department.  You will need to have your stitches removed in 10 to 14 days by a healthcare professional.  Please take the Keflex antibiotic as directed to prevent infection.  We recommend Tylenol or Motrin at home for discomfort.  For pain keeping you from sleeping at night, you can use the oxycodone medication for more significant pain.  Please return to the Emergency Department if you experience any worsening of your condition.  Thank you for allowing [place] to be a part of your care.        Maudie Flakes, MD 08/26/19 1406

## 2019-09-05 ENCOUNTER — Encounter: Payer: Self-pay | Admitting: Internal Medicine

## 2019-09-05 ENCOUNTER — Ambulatory Visit: Payer: Managed Care, Other (non HMO) | Admitting: Internal Medicine

## 2019-09-05 ENCOUNTER — Other Ambulatory Visit: Payer: Self-pay

## 2019-09-05 VITALS — BP 120/78 | HR 84 | Temp 98.3°F | Ht 71.0 in | Wt 231.8 lb

## 2019-09-05 DIAGNOSIS — Z4802 Encounter for removal of sutures: Secondary | ICD-10-CM

## 2019-09-05 DIAGNOSIS — S71112D Laceration without foreign body, left thigh, subsequent encounter: Secondary | ICD-10-CM

## 2019-09-05 MED ORDER — FUROSEMIDE 20 MG PO TABS
20.0000 mg | ORAL_TABLET | Freq: Every day | ORAL | 0 refills | Status: DC
Start: 2019-09-05 — End: 2020-01-04

## 2019-09-05 NOTE — Patient Instructions (Signed)
Wound Closure Removal, Care After This sheet gives you information about how to care for yourself after your stitches (sutures), staples, or skin adhesives have been removed. Your health care provider may also give you more specific instructions. If you have problems or questions, contact your health care provider. What can I expect after the procedure? After your sutures or staples have been removed or your skin adhesives have fallen off, it is common to have:  Some discomfort and swelling in the area.  Slight redness in the area. Follow these instructions at home: If you have a bandage:  Wash your hands with soap and water before you change your bandage (dressing). If soap and water are not available, use hand sanitizer.  Change your dressing as told by your health care provider. If your dressing becomes wet or dirty, or develops a bad smell, change it as soon as possible.  If your dressing sticks to your skin, pour warm, clean water over it until it loosens and can be removed without pulling apart the wound edges. Pat the area dry with a soft, clean towel. Do not rub the wound because that may cause bleeding. Wound care   Keep the wound area dry and clean.  Check your wound every day for signs of infection. Check for: ? Redness, swelling, or pain. ? Fluid or blood. ? Warmth. ? Pus or a bad smell.  Wash your hands with soap and water before and after touching your wound.  Apply cream or ointment only as told by your health care provider. If you are using cream or ointment, wash the area with soap and water 2 times a day to remove all the cream or ointment. Rinse off the soap and pat the area dry with a clean towel.  If skin glue or adhesive strips were applied after sutures or staples were removed, leave these closures in place until they peel off on their own. If adhesive strip edges start to loosen and curl up, you may trim the loose edges. Do not remove adhesive strips completely  unless your health care provider tells you to do that.  Continue to protect the wound from injury.  Do not pick at your wound. Picking can cause an infection. Bathing  Do not take baths, swim, or use a hot tub until your health care provider approves.  Ask your health care provider when it is okay to shower.  Follow these steps for showering: ? If you have a dressing, remove it before getting into the shower. ? In the shower, allow soapy water to get on the wound. Avoid scrubbing the wound. ? When you get out of the shower, dry the wound by patting it with a clean towel. ? Reapply a dressing over the wound if needed. Scar care  When your wound has completely healed, take actions to help decrease the size of your scar: ? Wear sunscreen over the scar or cover it with clothing when you are outside. New scars get sunburned easily, which can make scarring worse. ? Gently massage the scarred area. This can decrease scar thickness. General instructions  Take over-the-counter and prescription medicines only as told by your health care provider.  Keep all follow-up visits as told by your health care provider. This is important. Contact a health care provider if:  You have redness, swelling, or pain around your wound.  You have fluid or blood coming from your wound.  Your wound feels warm to the touch.  You have pus  or a bad smell coming from your wound.  Your wound opens up.  You have chills. Get help right away if:  You have a fever.  You have redness that is spreading from your wound. Summary  Change your dressing as told by your health care provider. If your dressing becomes wet or dirty, or develops a bad smell, change it as soon as possible.  Check your wound every day for signs of infection.  Wash your hands with soap and water before and after touching your wound. This information is not intended to replace advice given to you by your health care provider. Make sure  you discuss any questions you have with your health care provider. Document Revised: 12/18/2016 Document Reviewed: 10/26/2016 Elsevier Patient Education  2020 [employer].

## 2019-09-05 NOTE — Progress Notes (Signed)
Subjective:    Patient ID: Jerry Reid, male    DOB: 01-07-1976, 44 y.o.   MRN: [id number]  HPI  Patient presents the clinic today for ER follow-up.  He sustained a laceration to his left thigh.  X-ray was negative for fracture or foreign body.  The wound was closed with 4 Vicryl sutures and 7 nylon sutures.  He was given Keflex prophylactically to prevent infection and Oxycodone for pain.  He was discharged and advised to follow-up with his PCP for suture removal.  Since discharge, he reports he had to go to UC in [place] for further evaluation of the infected wound.  The wound was cultured but he has not heard the results of this test.  He was given a gram of Rocephin and placed on Cipro and amoxicillin.  He reports increase in redness, swelling, discharge and discomfort however he reports there is still a hard area around the laceration.  He had been running fevers but this has also subsided.  He received a tetanus booster in the ER.  Review of Systems  Past Medical History:  Diagnosis Date  . Chest pain   . Chicken pox   . Cholelithiasis   . Diabetes mellitus without complication ([place])   . Dysplastic nevus    sees Dr. Evorn Gong PAC [place]   . HTN (hypertension)   . Hyperlipidemia   . Kidney stones     Current Outpatient Medications  Medication Sig Dispense Refill  . empagliflozin (JARDIANCE) 25 MG TABS tablet Take 1 tablet (25 mg total) by mouth daily. 90 tablet 1  . fenofibrate micronized (LOFIBRA) 67 MG capsule Take 1 capsule (67 mg total) by mouth daily before breakfast. 90 capsule 1  . glipiZIDE (GLUCOTROL XL) 5 MG 24 hr tablet Take 1 tablet (5 mg total) by mouth daily with breakfast. 90 tablet 0  . glucose blood (COOL BLOOD GLUCOSE TEST STRIPS) test strip Use as instructed to check blood glucose up to three times a day, E11.29. Please dispense one touch strips per his insurance. Thanks 100 each 12  . lisinopril (ZESTRIL) 10 MG tablet Take 1 tablet (10 mg total) by mouth  daily. 90 tablet 0  . oxyCODONE (ROXICODONE) 5 MG immediate release tablet Take 1 tablet (5 mg total) by mouth every 4 (four) hours as needed for severe pain. 5 tablet 0   No current facility-administered medications for this visit.    Allergies  Allergen Reactions  . Metformin And Related Diarrhea    Family History  Problem Relation Age of Onset  . Heart attack Mother   . Heart disease Mother   . Hyperlipidemia Mother   . Hypertension Mother   . Diabetes Mother   . Hypertension Father   . Heart disease Father   . Hyperlipidemia Father   . Diabetes Father     Social History   Socioeconomic History  . Marital status: Married    Spouse name: Not on file  . Number of children: Not on file  . Years of education: Not on file  . Highest education level: Not on file  Occupational History  . Not on file  Tobacco Use  . Smoking status: Never Smoker  . Smokeless tobacco: Never Used  Substance and Sexual Activity  . Alcohol use: No  . Drug use: No  . Sexual activity: Not on file  Other Topics Concern  . Not on file  Social History Narrative   Works for [employer]    Married and  wife is asst. Principal   Social Determinants of Health   Financial Resource Strain:   . Difficulty of Paying Living Expenses:   Food Insecurity:   . Worried About Charity fundraiser in the Last Year:   . Arboriculturist in the Last Year:   Transportation Needs:   . Film/video editor (Medical):   Marland Kitchen Lack of Transportation (Non-Medical):   Physical Activity:   . Days of Exercise per Week:   . Minutes of Exercise per Session:   Stress:   . Feeling of Stress :   Social Connections:   . Frequency of Communication with Friends and Family:   . Frequency of Social Gatherings with Friends and Family:   . Attends Religious Services:   . Active Member of Clubs or Organizations:   . Attends Archivist Meetings:   Marland Kitchen Marital Status:   Intimate Partner Violence:   . Fear of Current  or Ex-Partner:   . Emotionally Abused:   Marland Kitchen Physically Abused:   . Sexually Abused:      Constitutional: Denies fever, malaise, fatigue, headache or abrupt weight changes.  ss of breath, cough or sputum production.   Cardiovascular: Denies chest pain, chest tightness, palpitations or swelling in the hands or feet.  Gastrointestinal: Denies abdominal pain, bloating, constipation, diarrhea or blood in the stool.  Skin: Pt reports laceration to left thigh with surrounding swelling. Denies rashes, lesions or ulcercations.    No other specific complaints in a complete review of systems (except as listed in HPI above).     Objective:   Physical Exam  BP 120/78 (BP Location: Left Arm, Patient Position: Sitting, Cuff Size: Large)   Pulse 84   Temp 98.3 F (36.8 C) (Temporal)   Ht 5\' 11"  (1.803 m)   Wt 231 lb 12 oz (105.1 kg)   SpO2 99%   BMI 32.32 kg/m   Wt Readings from Last 3 Encounters:  08/26/19 240 lb (108.9 kg)  07/19/19 237 lb (107.5 kg)  07/19/19 237 lb (107.5 kg)    General: Appears his stated age, obese, in NAD. Skin: 3 cm laceration to left medial thigh draining serosanguineous fluid with mild redness, no warmth surrounding the laceration.  He does have a large area of nonfluctuance surrounding the incision. Cardiovascular: Normal rate and rhythm.  Pulmonary/Chest: Normal effort and positive vesicular breath sounds.  Musculoskeletal: Gait slow and steady without device Neurological: Alert and oriented.    BMET    Component Value Date/Time   NA 139 07/19/2019 1005   K 4.4 07/19/2019 1005   CL 103 07/19/2019 1005   CO2 19 (L) 07/19/2019 1005   GLUCOSE 166 (H) 07/19/2019 1005   GLUCOSE 173 (H) 11/17/2018 0913   BUN 15 07/19/2019 1005   CREATININE 1.05 07/19/2019 1005   CALCIUM 9.5 07/19/2019 1005   GFRNONAA 85 07/19/2019 1005   GFRAA 98 07/19/2019 1005    Lipid Panel     Component Value Date/Time   CHOL 182 07/19/2019 1005   TRIG 478 (H) 07/19/2019 1005     HDL 28 (L) 07/19/2019 1005   CHOLHDL 6.5 (H) 07/19/2019 1005   LDLCALC 78 07/19/2019 1005    CBC    Component Value Date/Time   WBC 4.5 03/23/2018 0935   WBC 8.3 09/26/2015 0203   RBC 5.65 03/23/2018 0935   RBC 5.35 09/26/2015 0203   HGB 16.9 03/23/2018 0935   HCT 47.4 03/23/2018 0935   PLT 231 03/23/2018 0935  MCV 84 03/23/2018 0935   MCH 29.9 03/23/2018 0935   MCH 30.4 09/26/2015 0203   MCHC 35.7 03/23/2018 0935   MCHC 36.3 (H) 09/26/2015 0203   RDW 13.7 03/23/2018 0935   LYMPHSABS 1.3 03/23/2018 0935   EOSABS 0.0 03/23/2018 0935   BASOSABS 0.0 03/23/2018 0935    Hgb A1C Lab Results  Component Value Date   HGBA1C 6.5 (H) 07/19/2019          Assessment & Plan:   ER Follow Up for Laceration of Left Thigh:  ER notes and labs reviewed Sutures removed He will continue Cipro and Amoxicillin Rx for Lasix 20 mg daily x3 days Encouraged elevation  Return precautions discussed  Webb Silversmith, NP This visit occurred during the SARS-CoV-2 public health emergency.  Safety protocols were in place, including screening questions prior to the visit, additional usage of staff PPE, and extensive cleaning of exam room while observing appropriate contact time as indicated for disinfecting solutions.

## 2019-09-07 ENCOUNTER — Ambulatory Visit: Payer: Managed Care, Other (non HMO) | Admitting: Internal Medicine

## 2019-09-07 ENCOUNTER — Encounter: Payer: Self-pay | Admitting: Internal Medicine

## 2019-09-08 ENCOUNTER — Encounter: Payer: Self-pay | Admitting: Internal Medicine

## 2019-09-26 ENCOUNTER — Other Ambulatory Visit: Payer: Self-pay | Admitting: Internal Medicine

## 2019-11-24 ENCOUNTER — Other Ambulatory Visit: Payer: Self-pay | Admitting: Internal Medicine

## 2019-12-20 ENCOUNTER — Other Ambulatory Visit: Payer: Self-pay | Admitting: Internal Medicine

## 2019-12-26 ENCOUNTER — Telehealth: Payer: Managed Care, Other (non HMO) | Admitting: Physician Assistant

## 2019-12-26 NOTE — Progress Notes (Signed)
   Subjective:    Patient ID: Jerry Reid, male    DOB: 06-03-1975, 44 y.o.   MRN: [id number]  HPI  44 yo M calls in with questions about sore throat present first thing in morning x 2 days- denies other concerns He prefers phone call visit over Virtual visit- Respiratory concerns cannot be brought into this clinic- Should not pass screening in lobby  Has post nasal drip (no history of seasonal allergies)- awakened by raw throat sensation last 2 mornings Improves over course of the day Aware of nasal congestion during night, snoring  Has had Covid Vaccine x 2, no booster--  does not get Flu shots  Self-described poorly managed diet-  Does take Glipizide and Jardiance Does not check sugars or manage weight  Denies fever or  malaise, no NVD, no h/a,  no percussion tenderness over sinuses Nasal congestion, clear nasal discharge Appetite good, Taste and smell INTACT He is at work  Discomfort eases as day progresses- eating and drinking without difficulty   Review of Systems Parents both positive for heart disease, HTN and diabetes    Recent biometric exam results abnormal and were discussed at that time -briefly addressed today Hx GERD - no longer an issue Denies travel  He sees Webb Silversmith NP for PCP and has f/u 12/21 Objective:   Physical Exam significant symptoms and negatives as recorded above    Assessment & Plan:  Hydrate, steamy shower, cool mist vaporizer if available Water bottle at bedside Throat lozenges , sugar free, if desired Hydrate , increase water-not carbonated drinks- hot tea and honey for comfort prn suggested Tylenol /ibuprofen 1+1 every 6 hours if desired for discomfort Good hand-washing, wear mask, 6 feet spacing  Common cold 7-10 days can be expected- seek follow up if persistent or symptoms exacerbate or concerns arise- Covid screening can be performed or PCP consulted prn Encourage increased focus on DM care

## 2020-01-04 ENCOUNTER — Ambulatory Visit: Payer: Managed Care, Other (non HMO) | Admitting: Internal Medicine

## 2020-01-04 ENCOUNTER — Encounter: Payer: Self-pay | Admitting: Internal Medicine

## 2020-01-04 ENCOUNTER — Other Ambulatory Visit: Payer: Self-pay

## 2020-01-04 VITALS — BP 126/82 | HR 76 | Temp 97.0°F | Wt 231.0 lb

## 2020-01-04 DIAGNOSIS — I10 Essential (primary) hypertension: Secondary | ICD-10-CM

## 2020-01-04 DIAGNOSIS — K219 Gastro-esophageal reflux disease without esophagitis: Secondary | ICD-10-CM

## 2020-01-04 DIAGNOSIS — E781 Pure hyperglyceridemia: Secondary | ICD-10-CM

## 2020-01-04 DIAGNOSIS — E119 Type 2 diabetes mellitus without complications: Secondary | ICD-10-CM

## 2020-01-04 LAB — LDL CHOLESTEROL, DIRECT: Direct LDL: 119 mg/dL

## 2020-01-04 LAB — LIPID PANEL
Cholesterol: 203 mg/dL — ABNORMAL HIGH (ref 0–200)
HDL: 31.2 mg/dL — ABNORMAL LOW (ref >39.00)
Total CHOL/HDL Ratio: 6
Triglycerides: 468 mg/dL — ABNORMAL HIGH (ref 0.0–149.0)

## 2020-01-04 LAB — HEMOGLOBIN A1C: Hgb A1c MFr Bld: 6.2 % (ref 4.6–6.5)

## 2020-01-04 NOTE — Progress Notes (Signed)
Subjective:    Patient ID: Jerry Reid, male    DOB: 1975/11/11, 44 y.o.   MRN: [id number]  HPI  Pt presents to the clinic today for 6 month follow up of chronic conditions.  HTN: His BP today is 126/82. He is taking Lisinopril as prescribed ECG from 07/2010 reviewed.  HLD: His last LDL was 48, triglycerides 478. He is taking Gemfibrozil as prescribed but reports he did run out about 1 week ago. He does not consume a low fat diet.  DM 2: His last A1C was 6.5%, 06/2019. He is taking Glipizide and Jardiance as prescribed. He does not check his sugars. He does not check his feet routinely. His last eye exam was 2021. Flu never. 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 Covid pfizer.  GERD: He reports this is currently not an issue. There is no upper GI on file.  Review of Systems      Past Medical History:  Diagnosis Date  . Chest pain   . Chicken pox   . Cholelithiasis   . Diabetes mellitus without complication ([place])   . Dysplastic nevus    sees Dr. Evorn Gong PAC [place]   . HTN (hypertension)   . Hyperlipidemia   . Kidney stones     Current Outpatient Medications  Medication Sig Dispense Refill  . amoxicillin-clavulanate (AUGMENTIN) 875-125 MG tablet Take 1 tablet by mouth 2 (two) times daily. (Patient not taking: Reported on 12/26/2019)    . ciprofloxacin (CIPRO) 500 MG tablet Take 500 mg by mouth 2 (two) times daily. (Patient not taking: Reported on 12/26/2019)    . empagliflozin (JARDIANCE) 25 MG TABS tablet Take 1 tablet (25 mg total) by mouth daily. 90 tablet 1  . fenofibrate micronized (LOFIBRA) 67 MG capsule TAKE 1 CAPSULE (67 MG TOTAL) BY MOUTH DAILY BEFORE BREAKFAST. 30 capsule 5  . furosemide (LASIX) 20 MG tablet Take 1 tablet (20 mg total) by mouth daily. (Patient not taking: Reported on 12/26/2019) 3 tablet 0  . glipiZIDE (GLUCOTROL XL) 5 MG 24 hr tablet TAKE 1 TABLET BY MOUTH EVERY DAY WITH BREAKFAST 90 tablet 0  . glucose blood (COOL BLOOD GLUCOSE TEST STRIPS) test strip Use as  instructed to check blood glucose up to three times a day, E11.29. Please dispense one touch strips per his insurance. Thanks 100 each 12  . lisinopril (ZESTRIL) 10 MG tablet TAKE 1 TABLET BY MOUTH EVERY DAY 90 tablet 0  . oxyCODONE (ROXICODONE) 5 MG immediate release tablet Take 1 tablet (5 mg total) by mouth every 4 (four) hours as needed for severe pain. (Patient not taking: Reported on 12/26/2019) 5 tablet 0   No current facility-administered medications for this visit.    Allergies  Allergen Reactions  . Metformin And Related Diarrhea    Family History  Problem Relation Age of Onset  . Heart attack Mother   . Heart disease Mother   . Hyperlipidemia Mother   . Hypertension Mother   . Diabetes Mother   . Hypertension Father   . Heart disease Father   . Hyperlipidemia Father   . Diabetes Father     Social History   Socioeconomic History  . Marital status: Married    Spouse name: Not on file  . Number of children: Not on file  . Years of education: Not on file  . Highest education level: Not on file  Occupational History  . Not on file  Tobacco Use  . Smoking status: Never Smoker  . Smokeless tobacco: Never Used  Substance and Sexual Activity  . Alcohol use: No  . Drug use: No  . Sexual activity: Not on file  Other Topics Concern  . 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  HEENT: Pt reports vision changes. Denies eye pain, eye redness, ear pain, ringing in the ears, wax buildup, runny nose, nasal congestion, bloody nose, or sore throat. Respiratory:  Denies difficulty breathing, shortness of breath, cough or sputum production.   Cardiovascular: Denies chest pain, chest tightness, palpitations or swelling in the hands or feet.  Skin: Denies redness, rashes, lesions or ulcercations.  Neurological: Denies numbness, tingling  or problems with balance and coordination.   No other specific complaints in a complete review of systems (except as listed in HPI above).  Objective:   Physical Exam  BP 126/82   Pulse 76   Temp (!) 97 F (36.1 C) (Temporal)   Wt 231 lb (104.8 kg)   SpO2 97%   BMI 32.22 kg/m   Wt Readings from Last 3 Encounters:  09/05/19 231 lb 12 oz (105.1 kg)  08/26/19 240 lb (108.9 kg)  07/19/19 237 lb (107.5 kg)    General: Appears his stated age, obese, in NAD. Skin: Warm, dry and intact. No ulcerations noted. HEENT: Head: normal shape and size; Eyes: sclera white, no icterus, conjunctiva pink, PERRLA and EOMs intact;  Cardiovascular: Normal rate and rhythm. S1,S2 noted.  No murmur, rubs or gallops noted. No JVD or BLE edema. No carotid bruits noted. Pulmonary/Chest: Normal effort and positive vesicular breath sounds. No respiratory distress. No wheezes, rales or ronchi noted.  Musculoskeletal:  No difficulty with gait.  Neurological: Alert and oriented.    BMET    Component Value Date/Time   NA 139 07/19/2019 1005   K 4.4 07/19/2019 1005   CL 103 07/19/2019 1005   CO2 19 (L) 07/19/2019 1005   GLUCOSE 166 (H) 07/19/2019 1005   GLUCOSE 173 (H) 11/17/2018 0913   BUN 15 07/19/2019 1005   CREATININE 1.05 07/19/2019 1005   CALCIUM 9.5 07/19/2019 1005   GFRNONAA 85 07/19/2019 1005   GFRAA 98 07/19/2019 1005    Lipid Panel     Component Value Date/Time   CHOL 182 07/19/2019 1005   TRIG 478 (H) 07/19/2019 1005   HDL 28 (L) 07/19/2019 1005   CHOLHDL 6.5 (H) 07/19/2019 1005   LDLCALC 78 07/19/2019 1005    CBC    Component Value Date/Time   WBC 4.5 03/23/2018 0935   WBC 8.3 09/26/2015 0203   RBC 5.65  03/23/2018 0935   RBC 5.35 09/26/2015 0203   HGB 16.9 03/23/2018 0935   HCT 47.4 03/23/2018 0935   PLT 231 03/23/2018 0935   MCV 84 03/23/2018 0935   MCH 29.9 03/23/2018 0935   MCH 30.4 09/26/2015 0203   MCHC 35.7 03/23/2018 0935   MCHC 36.3 (H) 09/26/2015 0203   RDW 13.7 03/23/2018 0935   LYMPHSABS 1.3 03/23/2018 0935   EOSABS 0.0 03/23/2018 0935   BASOSABS 0.0 03/23/2018 0935    Hgb A1C Lab  Results  Component Value Date   HGBA1C 6.5 (H) 07/19/2019            Assessment & Plan:    Webb Silversmith, NP This visit occurred during the SARS-CoV-2 public health emergency.  Safety protocols were in place, including screening questions prior to the visit, additional usage of staff PPE, and extensive cleaning of exam room while observing appropriate contact time as indicated for disinfecting solutions.

## 2020-01-04 NOTE — Assessment & Plan Note (Signed)
A1C today No urine microalbumin secondary to ACEI therapy Encouraged him to consume a low carb diet and exercise for weight loss Continue Glipizide and Jardiance Encouraged routine eye exams- will request copy Encouraged routine foot exams He declines flu shot Pneumovax and covid UTD

## 2020-01-04 NOTE — Patient Instructions (Signed)
Carbohydrate Counting for Diabetes Mellitus, Adult  Carbohydrate counting is a method of keeping track of how many carbohydrates you eat. Eating carbohydrates naturally increases the amount of sugar (glucose) in the blood. Counting how many carbohydrates you eat helps keep your blood glucose within normal limits, which helps you manage your diabetes (diabetes mellitus). It is important to know how many carbohydrates you can safely have in each meal. This is different for every person. A diet and nutrition specialist (registered dietitian) can help you make a meal plan and calculate how many carbohydrates you should have at each meal and snack. Carbohydrates are found in the following foods:  Grains, such as breads and cereals.  Dried beans and soy products.  Starchy vegetables, such as potatoes, peas, and corn.  Fruit and fruit juices.  Milk and yogurt.  Sweets and snack foods, such as cake, cookies, candy, chips, and soft drinks. How do I count carbohydrates? There are two ways to count carbohydrates in food. You can use either of the methods or a combination of both. Reading "Nutrition Facts" on packaged food The "Nutrition Facts" list is included on the labels of almost all packaged foods and beverages in the [place] It includes:  The serving size.  Information about nutrients in each serving, including the grams (g) of carbohydrate per serving. To use the "Nutrition Facts":  Decide how many servings you will have.  Multiply the number of servings by the number of carbohydrates per serving.  The resulting number is the total amount of carbohydrates that you will be having. Learning standard serving sizes of other foods When you eat carbohydrate foods that are not packaged or do not include "Nutrition Facts" on the label, you need to measure the servings in order to count the amount of carbohydrates:  Measure the foods that you will eat with a food scale or measuring cup, if  needed.  Decide how many standard-size servings you will eat.  Multiply the number of servings by 15. Most carbohydrate-rich foods have about 15 g of carbohydrates per serving. ? For example, if you eat 8 oz (170 g) of strawberries, you will have eaten 2 servings and 30 g of carbohydrates (2 servings x 15 g = 30 g).  For foods that have more than one food mixed, such as soups and casseroles, you must count the carbohydrates in each food that is included. The following list contains standard serving sizes of common carbohydrate-rich foods. Each of these servings has about 15 g of carbohydrates:   hamburger bun or  English muffin.   oz (15 mL) syrup.   oz (14 g) jelly.  1 slice of bread.  1 six-inch tortilla.  3 oz (85 g) cooked rice or pasta.  4 oz (113 g) cooked dried beans.  4 oz (113 g) starchy vegetable, such as peas, corn, or potatoes.  4 oz (113 g) hot cereal.  4 oz (113 g) mashed potatoes or  of a large baked potato.  4 oz (113 g) canned or frozen fruit.  4 oz (120 mL) fruit juice.  4-6 crackers.  6 chicken nuggets.  6 oz (170 g) unsweetened dry cereal.  6 oz (170 g) plain fat-free yogurt or yogurt sweetened with artificial sweeteners.  8 oz (240 mL) milk.  8 oz (170 g) fresh fruit or one small piece of fruit.  24 oz (680 g) popped popcorn. Example of carbohydrate counting Sample meal  3 oz (85 g) chicken breast.  6 oz (170 g)   brown rice.  4 oz (113 g) corn.  8 oz (240 mL) milk.  8 oz (170 g) strawberries with sugar-free whipped topping. Carbohydrate calculation 1. Identify the foods that contain carbohydrates: ? Rice. ? Corn. ? Milk. ? Strawberries. 2. Calculate how many servings you have of each food: ? 2 servings rice. ? 1 serving corn. ? 1 serving milk. ? 1 serving strawberries. 3. Multiply each number of servings by 15 g: ? 2 servings rice x 15 g = 30 g. ? 1 serving corn x 15 g = 15 g. ? 1 serving milk x 15 g = 15 g. ? 1  serving strawberries x 15 g = 15 g. 4. Add together all of the amounts to find the total grams of carbohydrates eaten: ? 30 g + 15 g + 15 g + 15 g = 75 g of carbohydrates total. Summary  Carbohydrate counting is a method of keeping track of how many carbohydrates you eat.  Eating carbohydrates naturally increases the amount of sugar (glucose) in the blood.  Counting how many carbohydrates you eat helps keep your blood glucose within normal limits, which helps you manage your diabetes.  A diet and nutrition specialist (registered dietitian) can help you make a meal plan and calculate how many carbohydrates you should have at each meal and snack. This information is not intended to replace advice given to you by your health care provider. Make sure you discuss any questions you have with your health care provider. Document Revised: 07/30/2016 Document Reviewed: 06/19/2015 Elsevier Patient Education  2020 Elsevier Inc.  

## 2020-01-04 NOTE — Assessment & Plan Note (Signed)
Controlled on Lisinopril BMET reviewed Reinforced DASH diet and exercise for weight loss Will monitor

## 2020-01-04 NOTE — Assessment & Plan Note (Signed)
CMET reviewed Lipid profile today He will refill Gemfibrozil Discussed possible statin therapy, pending labs Encouraged him to consume a low fat diet

## 2020-01-04 NOTE — Assessment & Plan Note (Signed)
Currently not an issue Will monitor 

## 2020-01-08 ENCOUNTER — Encounter: Payer: Self-pay | Admitting: Internal Medicine

## 2020-02-15 ENCOUNTER — Encounter: Payer: Self-pay | Admitting: Internal Medicine

## 2020-02-21 ENCOUNTER — Other Ambulatory Visit: Payer: Self-pay | Admitting: Internal Medicine

## 2020-02-21 DIAGNOSIS — E119 Type 2 diabetes mellitus without complications: Secondary | ICD-10-CM

## 2020-03-19 ENCOUNTER — Other Ambulatory Visit: Payer: Self-pay | Admitting: Internal Medicine

## 2020-04-12 ENCOUNTER — Encounter: Payer: Self-pay | Admitting: Internal Medicine

## 2020-04-17 MED ORDER — ROSUVASTATIN CALCIUM 5 MG PO TABS
5.0000 mg | ORAL_TABLET | Freq: Every day | ORAL | 0 refills | Status: DC
Start: 2020-04-17 — End: 2020-07-15

## 2020-06-18 ENCOUNTER — Encounter: Payer: Self-pay | Admitting: Physician Assistant

## 2020-06-18 ENCOUNTER — Ambulatory Visit: Payer: Managed Care, Other (non HMO) | Admitting: Physician Assistant

## 2020-06-18 VITALS — BP 128/82 | HR 73 | Temp 98.3°F | Resp 17 | Ht 69.0 in | Wt 238.0 lb

## 2020-06-18 DIAGNOSIS — Z0000 Encounter for general adult medical examination without abnormal findings: Secondary | ICD-10-CM

## 2020-06-18 DIAGNOSIS — Z008 Encounter for other general examination: Secondary | ICD-10-CM

## 2020-06-18 NOTE — Progress Notes (Signed)
Subjective:    Patient ID: Jerry Reid, male    DOB: [birth year], 45 y.o.   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   Jerry Reid has known DM2 and is currently on  Glucotrol XL  5 mg daily before breakfast  ( stopped his Jardiance)  Has has testing strips at home - but stopped using them a "long time ago" So is unsure of his control on day to day basis      5'  11"      238 lbs   He is on [place] 67mg  1 tablet before breakfast  for cholesterol control along with Crestor  5 mg daily   Zestril 10 mg one tablet daily for HTN    128/82 has had much higher readings in the past, today better,  but is reminded that weight can negatively influence BP - up 7 pounds x 5 mos  Has had Covid Vaccines x 2  Non-smoker  PCP -Webb Silversmith NP- [facility]   Review of Systems   Has been aware of fullness in knuckles and hands,particulalry left thumb with some swelling,denies pain notes fullness x  over the past few months- recently padded his bucket handles making them much more comfortable  Has concerns about his dad developing Parkinsons -encouraged to review  questions and concerns with PCP-    Objective:   Physical Exam Vitals and nursing note reviewed.  Constitutional:      General: He is not in acute distress.    Appearance: Normal appearance. He is obese.  HENT:     Head: Atraumatic.     Right Ear: Tympanic membrane, ear canal and external ear normal.     Left Ear: Tympanic membrane, ear canal and external ear normal.     Ears:     Comments: Qtips intermittent Became evident during visit that he is slightly hard of hearing and favors left ear when possible    Nose: Nose normal.     Mouth/Throat:     Mouth: Mucous membranes are moist.     Comments: Needs to update DDS 6 mos visit Eyes:      Extraocular Movements: Extraocular movements intact.     Conjunctiva/sclera: Conjunctivae normal.     Comments: Needs annual DM eye check  Cardiovascular:     Rate and Rhythm: Normal rate and regular rhythm.     Pulses: Normal pulses.  Pulmonary:     Effort: Pulmonary effort is normal.     Breath sounds: Normal breath sounds.  Abdominal:     General: Abdomen is flat.     Palpations: Abdomen is soft.     Tenderness: There is no abdominal tenderness.  Genitourinary:    Comments: defer Musculoskeletal:        General: No tenderness. Normal range of motion.     Cervical back: Normal range of motion and neck supple. No rigidity.     Comments: Bilateral hands with diffuse finger enlargement good pulses and strength - denies pain with ROM or squeeze   Lymphadenopathy:     Cervical: No cervical adenopathy.  Skin:    General: Skin is warm and dry.     Capillary Refill: Capillary refill takes less than 2 seconds.  Neurological:     General: No focal  deficit present.     Mental Status: He is alert.  Psychiatric:        Mood and Affect: Mood normal.     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

## 2020-06-19 LAB — LIPID PANEL
Chol/HDL Ratio: 5.2 ratio — ABNORMAL HIGH (ref 0.0–5.0)
Cholesterol, Total: 150 mg/dL (ref 100–199)
HDL: 29 mg/dL — ABNORMAL LOW (ref >39)
LDL Chol Calc (NIH): 71 mg/dL (ref 0–99)
Triglycerides: 309 mg/dL — ABNORMAL HIGH (ref 0–149)
VLDL Cholesterol Cal: 50 mg/dL — ABNORMAL HIGH (ref 5–40)

## 2020-06-19 LAB — GLUCOSE, RANDOM: Glucose: 242 mg/dL — ABNORMAL HIGH (ref 65–99)

## 2020-06-21 ENCOUNTER — Encounter: Payer: Self-pay | Admitting: Internal Medicine

## 2020-06-21 ENCOUNTER — Other Ambulatory Visit: Payer: Self-pay | Admitting: Internal Medicine

## 2020-06-27 ENCOUNTER — Ambulatory Visit
Admission: RE | Admit: 2020-06-27 | Discharge: 2020-06-27 | Disposition: A | Discharge status: Home / Self Care | Payer: Managed Care, Other (non HMO) | Source: Ambulatory Visit | Attending: Internal Medicine | Admitting: Internal Medicine

## 2020-06-27 ENCOUNTER — Ambulatory Visit: Payer: Managed Care, Other (non HMO) | Admitting: Internal Medicine

## 2020-06-27 ENCOUNTER — Ambulatory Visit
Admission: RE | Admit: 2020-06-27 | Discharge: 2020-06-27 | Disposition: A | Discharge status: Home / Self Care | Payer: Managed Care, Other (non HMO) | Attending: Internal Medicine | Admitting: Internal Medicine

## 2020-06-27 ENCOUNTER — Other Ambulatory Visit: Payer: Self-pay

## 2020-06-27 ENCOUNTER — Encounter: Payer: Self-pay | Admitting: Internal Medicine

## 2020-06-27 VITALS — BP 124/86 | HR 79 | Temp 97.8°F | Resp 18 | Ht 69.0 in | Wt 242.6 lb

## 2020-06-27 DIAGNOSIS — E1165 Type 2 diabetes mellitus with hyperglycemia: Secondary | ICD-10-CM

## 2020-06-27 DIAGNOSIS — Z6835 Body mass index (BMI) 35.0-35.9, adult: Secondary | ICD-10-CM

## 2020-06-27 DIAGNOSIS — I10 Essential (primary) hypertension: Secondary | ICD-10-CM | POA: Diagnosis not present

## 2020-06-27 DIAGNOSIS — M79641 Pain in right hand: Secondary | ICD-10-CM

## 2020-06-27 DIAGNOSIS — M79642 Pain in left hand: Secondary | ICD-10-CM | POA: Insufficient documentation

## 2020-06-27 DIAGNOSIS — E781 Pure hyperglyceridemia: Secondary | ICD-10-CM | POA: Diagnosis not present

## 2020-06-27 LAB — POCT GLYCOSYLATED HEMOGLOBIN (HGB A1C): Hemoglobin A1C: 7.8 % — AB (ref 4.0–5.6)

## 2020-06-27 MED ORDER — GLIPIZIDE ER 10 MG PO TB24: 10.0000 mg | ORAL_TABLET | Freq: Every day | ORAL | 0 refills | Status: DC

## 2020-06-27 NOTE — Progress Notes (Signed)
Subjective:    Patient ID: Jerry Reid, male    DOB: Jul 13, 1975, 45 y.o.   MRN: [id number]  HPI  Patient presents the clinic today for follow-up of chronic conditions.  DM2: His last A1c was 6.2%, 12/2019.  He reports he recently had an elevated glucose of 242 at employee health.  He is taking glipizide as prescribed.  He does not check his sugars.  He checks his feet routinely.  He does not take flu shots.  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  COVID-Pfizer x2.  HTN: His BP today is 124/86.  He is taking lisinopril as prescribed.  ECG from 07/2010 reviewed.  HLD: His last LDL was 71, triglycerides 309, 05/2020.  He is taking Rosuvastatin and Fenofibrate as prescribed.  He tries to consume a low-fat diet.  He also reports bilateral hand pain, left greater than right.  He reports this has been an ongoing issue.  He reports his hands feel stiff.  He has not noticed any joint swelling or redness.  He reports a family history of rheumatoid arthritis.  Review of Systems     Past Medical History:  Diagnosis Date   Chest pain    Chicken pox    Cholelithiasis    Diabetes mellitus without complication ([facility])    Dysplastic nevus    sees Dr. Evorn Gong PAC [place]    HTN (hypertension)    Hyperlipidemia    Kidney stones     Current Outpatient Medications  Medication Sig Dispense Refill   fenofibrate micronized (LOFIBRA) 67 MG capsule TAKE 1 CAPSULE (67 MG TOTAL) BY MOUTH DAILY BEFORE BREAKFAST. 30 capsule 5   glipiZIDE (GLUCOTROL XL) 5 MG 24 hr tablet TAKE 1 TABLET BY MOUTH EVERY DAY WITH BREAKFAST 90 tablet 0   glucose blood (COOL BLOOD GLUCOSE TEST STRIPS) test strip Use as instructed to check blood glucose up to three times a day, E11.29. Please dispense one touch strips per his insurance. Thanks 100 each 12   lisinopril (ZESTRIL) 10 MG tablet TAKE 1 TABLET BY MOUTH EVERY DAY 90 tablet 0   rosuvastatin (CRESTOR) 5 MG tablet Take 1 tablet (5 mg total) by mouth daily. 90 tablet 0   empagliflozin  (JARDIANCE) 25 MG TABS tablet Take 1 tablet (25 mg total) by mouth daily. (Patient not taking: Reported on 06/27/2020) 90 tablet 1   No current facility-administered medications for this visit.    Allergies  Allergen Reactions   Metformin And Related Diarrhea    Family History  Problem Relation Age of Onset   Heart attack Mother    Heart disease Mother    Hyperlipidemia Mother    Hypertension Mother    Diabetes Mother    Hypertension Father    Heart disease Father    Hyperlipidemia Father    Diabetes Father     Social History   Socioeconomic History   Marital status: Married    Spouse name: Not on file   Number of children: Not on file   Years of education: Not on file   Highest education level: Not on file  Occupational History   Not on file  Tobacco Use   Smoking status: Never   Smokeless tobacco: Never  Substance and Sexual Activity   Alcohol use: No   Drug use: No   Sexual activity: Not on file  Other Topics Concern   Not on file  Social History Narrative   Works for the county    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  HEENT: Denies eye pain, eye redness, ear pain, ringing in the ears, wax buildup, runny nose, nasal congestion, bloody nose, or sore throat. Respiratory: Denies difficulty breathing, shortness of breath, cough or sputum production.   Cardiovascular: Denies chest pain, chest tightness, palpitations or swelling in the hands or feet.  Gastrointestinal: Denies abdominal pain, bloating, constipation, diarrhea or blood in the stool.  GU: Denies urgency, frequency, pain with urination, burning  sensation, blood in urine, odor or discharge. Musculoskeletal: Patient reports bilateral hand pain.  Denies decrease in range of motion, difficulty with gait, muscle pain or joint swelling.  Skin: Denies redness, rashes, lesions or ulcercations.  Neurological: Denies dizziness, difficulty with memory, difficulty with speech or problems with balance and coordination.  Psych: Denies anxiety, depression, SI/HI.  No other specific complaints in a complete review of systems (except as listed in HPI above).  Objective:   Physical Exam  BP 124/86 (BP Location: Left Arm, Patient Position: Sitting, Cuff Size: Large)   Pulse 79   Temp 97.8 F (36.6 C) (Temporal)   Resp 18   Ht 5' 9"  (1.753 m)   Wt 110 kg   SpO2 99%   BMI 35.83 kg/m   Wt Readings from Last 3 Encounters:  06/27/20 242 lb 9.6 oz (110 kg)  06/18/20 238 lb (108 kg)  01/04/20 231 lb (104.8 kg)    General: Appears his stated age, obese, in NAD. Skin: Warm, dry and intact. No ulcerations noted. HEENT: Head: normal shape and size; Eyes: sclera white and EOMs intact;  Cardiovascular: Normal rate and rhythm. S1,S2 noted.  No murmur, rubs or gallops noted.  Trace pitting BLE edema. No carotid bruits noted. Pulmonary/Chest: Normal effort and positive vesicular breath sounds. No respiratory distress. No wheezes, rales or ronchi noted.  Musculoskeletal: Normal flexion and extension of the fingers.  No joint enlargement or swelling noted.  No difficulty with gait.  Neurological: Alert and oriented.  Psych: Mood and affect normal.   BMET    Component Value Date/Time   NA 139 07/19/2019 1005   K 4.4 07/19/2019 1005   CL 103 07/19/2019 1005   CO2 19 (L) 07/19/2019 1005   GLUCOSE 242 (H) 06/18/2020 1259   GLUCOSE 173 (H) 11/17/2018 0913   BUN 15 07/19/2019 1005   CREATININE 1.05 07/19/2019 1005   CALCIUM 9.5 07/19/2019 1005   GFRNONAA 85 07/19/2019 1005   GFRAA 98 07/19/2019 1005    Lipid Panel     Component Value Date/Time    CHOL 150 06/18/2020 1259   TRIG 309 (H) 06/18/2020 1259   HDL 29 (L) 06/18/2020 1259   CHOLHDL 5.2 (H) 06/18/2020 1259   CHOLHDL 6 01/04/2020 0918   LDLCALC 71 06/18/2020 1259    CBC    Component Value Date/Time   WBC 4.5 03/23/2018 0935   WBC 8.3 09/26/2015 0203   RBC 5.65 03/23/2018 0935   RBC 5.35 09/26/2015 0203   HGB 16.9 03/23/2018 0935   HCT 47.4 03/23/2018 0935   PLT 231 03/23/2018 0935   MCV 84 03/23/2018 0935   MCH 29.9 03/23/2018 0935   MCH 30.4 09/26/2015 0203  MCHC 35.7 03/23/2018 0935   MCHC 36.3 (H) 09/26/2015 0203   RDW 13.7 03/23/2018 0935   LYMPHSABS 1.3 03/23/2018 0935   EOSABS 0.0 03/23/2018 0935   BASOSABS 0.0 03/23/2018 0935    Hgb A1C Lab Results  Component Value Date   HGBA1C 6.2 01/04/2020            Assessment & Plan:   Chronic Bilateral Hand Pain, Family History of RA:  Will obtain ANA, ESR, CRP and RF today X-ray bilateral hands  RTC in 3 months for follow-up chronic conditions, we will follow-up after imaging  Webb Silversmith, NP This visit occurred during the SARS-CoV-2 public health emergency.  Safety protocols were in place, including screening questions prior to the visit, additional usage of staff PPE, and extensive cleaning of exam room while observing appropriate contact time as indicated for disinfecting solutions.

## 2020-06-28 ENCOUNTER — Other Ambulatory Visit: Payer: Managed Care, Other (non HMO)

## 2020-06-28 DIAGNOSIS — M79642 Pain in left hand: Secondary | ICD-10-CM

## 2020-06-28 DIAGNOSIS — E781 Pure hyperglyceridemia: Secondary | ICD-10-CM

## 2020-06-30 DIAGNOSIS — E6609 Other obesity due to excess calories: Secondary | ICD-10-CM | POA: Insufficient documentation

## 2020-06-30 DIAGNOSIS — Z6832 Body mass index (BMI) 32.0-32.9, adult: Secondary | ICD-10-CM | POA: Insufficient documentation

## 2020-06-30 NOTE — Assessment & Plan Note (Signed)
C-Met and lipid profile today Encouraged him to consume a low-fat diet Continue Rosuvastatin and Fenofibrate for now, will adjust if needed based on labs

## 2020-06-30 NOTE — Assessment & Plan Note (Signed)
POCT A1c today No urine microalbumin secondary to ACEI therapy Increase Glipizide to 10 mg daily Reinforced low-carb diet and exercise weight loss Encourage routine eye exam Encourage routine foot exam Encouraged him to do flu shot in fall Pneumovax UTD Encouraged him to get his COVID booster

## 2020-06-30 NOTE — Assessment & Plan Note (Signed)
Controlled on Lisinopril ?Reinforced DASH diet and exercise for weight loss ?C-Met today ?

## 2020-06-30 NOTE — Patient Instructions (Signed)
https://www.diabeteseducator.org/docs/default-source/living-with-diabetes/conquering-the-grocery-store-v1.pdf?sfvrsn=4">  Carbohydrate Counting for Diabetes Mellitus, Adult Carbohydrate counting is a method of keeping track of how many carbohydrates you eat. Eating carbohydrates naturally increases the amount of sugar (glucose) in the blood. Counting how many carbohydrates you eat improves your bloodglucose control, which helps you manage your diabetes. It is important to know how many carbohydrates you can safely have in each meal. This is different for every person. A dietitian can help you make a meal plan and calculate how many carbohydrates you should have at each meal andsnack. What foods contain carbohydrates? Carbohydrates are found in the following foods: Grains, such as breads and cereals. Dried beans and soy products. Starchy vegetables, such as potatoes, peas, and corn. Fruit and fruit juices. Milk and yogurt. Sweets and snack foods, such as cake, cookies, candy, chips, and soft drinks. How do I count carbohydrates in foods? There are two ways to count carbohydrates in food. You can read food labels or learn standard serving sizes of foods. You can use either of the methods or acombination of both. Using the Nutrition Facts label The Nutrition Facts list is included on the labels of almost all packaged foods and beverages in the [place] It includes: The serving size. Information about nutrients in each serving, including the grams (g) of carbohydrate per serving. To use the Nutrition Facts: Decide how many servings you will have. Multiply the number of servings by the number of carbohydrates per serving. The resulting number is the total amount of carbohydrates that you will be having. Learning the standard serving sizes of foods When you eat carbohydrate foods that are not packaged or do not include Nutrition Facts on the label, you need to measure the servings in order to count the  amount of carbohydrates. Measure the foods that you will eat with a food scale or measuring cup, if needed. Decide how many standard-size servings you will eat. Multiply the number of servings by 15. For foods that contain carbohydrates, one serving equals 15 g of carbohydrates. For example, if you eat 2 cups or 10 oz (300 g) of strawberries, you will have eaten 2 servings and 30 g of carbohydrates (2 servings x 15 g = 30 g). For foods that have more than one food mixed, such as soups and casseroles, you must count the carbohydrates in each food that is included. The following list contains standard serving sizes of common carbohydrate-rich foods. Each of these servings has about 15 g of carbohydrates: 1 slice of bread. 1 six-inch (15 cm) tortilla. ? cup or 2 oz (53 g) cooked rice or pasta.  cup or 3 oz (85 g) cooked or canned, drained and rinsed beans or lentils.  cup or 3 oz (85 g) starchy vegetable, such as peas, corn, or squash.  cup or 4 oz (120 g) hot cereal.  cup or 3 oz (85 g) boiled or mashed potatoes, or  or 3 oz (85 g) of a large baked potato.  cup or 4 fl oz (118 mL) fruit juice. 1 cup or 8 fl oz (237 mL) milk. 1 small or 4 oz (106 g) apple.  or 2 oz (63 g) of a medium banana. 1 cup or 5 oz (150 g) strawberries. 3 cups or 1 oz (24 g) popped popcorn. What is an example of carbohydrate counting? To calculate the number of carbohydrates in this sample meal, follow the stepsshown below. Sample meal 3 oz (85 g) chicken breast. ? cup or 4 oz (106 g) brown rice.    cup or 3 oz (85 g) corn. 1 cup or 8 fl oz (237 mL) milk. 1 cup or 5 oz (150 g) strawberries with sugar-free whipped topping. Carbohydrate calculation Identify the foods that contain carbohydrates: Rice. Corn. Milk. Strawberries. Calculate how many servings you have of each food: 2 servings rice. 1 serving corn. 1 serving milk. 1 serving strawberries. Multiply each number of servings by 15 g: 2 servings  rice x 15 g = 30 g. 1 serving corn x 15 g = 15 g. 1 serving milk x 15 g = 15 g. 1 serving strawberries x 15 g = 15 g. Add together all of the amounts to find the total grams of carbohydrates eaten: 30 g + 15 g + 15 g + 15 g = 75 g of carbohydrates total. What are tips for following this plan? Shopping Develop a meal plan and then make a shopping list. Buy fresh and frozen vegetables, fresh and frozen fruit, dairy, eggs, beans, lentils, and whole grains. Look at food labels. Choose foods that have more fiber and less sugar. Avoid processed foods and foods with added sugars. Meal planning Aim to have the same amount of carbohydrates at each meal and for each snack time. Plan to have regular, balanced meals and snacks. Where to find more information American Diabetes Association: www.diabetes.org Centers for Disease Control and Prevention: www.cdc.gov Summary Carbohydrate counting is a method of keeping track of how many carbohydrates you eat. Eating carbohydrates naturally increases the amount of sugar (glucose) in the blood. Counting how many carbohydrates you eat improves your blood glucose control, which helps you manage your diabetes. A dietitian can help you make a meal plan and calculate how many carbohydrates you should have at each meal and snack. This information is not intended to replace advice given to you by your health care provider. Make sure you discuss any questions you have with your healthcare provider. Document Revised: 01/05/2019 Document Reviewed: 01/06/2019 Elsevier Patient Education  2021 Elsevier Inc.  

## 2020-07-01 LAB — COMPLETE METABOLIC PANEL WITH GFR
AG Ratio: 2 (calc) (ref 1.0–2.5)
ALT: 32 U/L (ref 9–46)
AST: 21 U/L (ref 10–40)
Albumin: 4.5 g/dL (ref 3.6–5.1)
Alkaline phosphatase (APISO): 44 U/L (ref 36–130)
BUN: 11 mg/dL (ref 7–25)
CO2: 23 mmol/L (ref 20–32)
Calcium: 9.3 mg/dL (ref 8.6–10.3)
Chloride: 103 mmol/L (ref 98–110)
Creat: 0.94 mg/dL (ref 0.60–1.35)
GFR, Est African American: 111 mL/min/{1.73_m2} (ref >=60)
GFR, Est Non African American: 96 mL/min/{1.73_m2} (ref >=60)
Globulin: 2.2 g/dL (calc) (ref 1.9–3.7)
Glucose, Bld: 247 mg/dL — ABNORMAL HIGH (ref 65–99)
Potassium: 4 mmol/L (ref 3.5–5.3)
Sodium: 135 mmol/L (ref 135–146)
Total Bilirubin: 1.4 mg/dL — ABNORMAL HIGH (ref 0.2–1.2)
Total Protein: 6.7 g/dL (ref 6.1–8.1)

## 2020-07-01 LAB — LIPID PANEL
Cholesterol: 128 mg/dL (ref <200)
HDL: 32 mg/dL — ABNORMAL LOW (ref >=40)
LDL Cholesterol (Calc): 65 mg/dL (calc)
Non-HDL Cholesterol (Calc): 96 mg/dL (calc) (ref <130)
Total CHOL/HDL Ratio: 4 (calc) (ref <5.0)
Triglycerides: 298 mg/dL — ABNORMAL HIGH (ref <150)

## 2020-07-01 LAB — SEDIMENTATION RATE: Sed Rate: 2 mm/h (ref 0–15)

## 2020-07-01 LAB — C-REACTIVE PROTEIN: CRP: 1.6 mg/L (ref <8.0)

## 2020-07-01 LAB — RHEUMATOID FACTOR: Rheumatoid fact SerPl-aCnc: <14 IU/mL (ref <14)

## 2020-07-01 LAB — ANA: Anti Nuclear Antibody (ANA): NEGATIVE

## 2020-07-02 ENCOUNTER — Other Ambulatory Visit: Payer: Self-pay | Admitting: Internal Medicine

## 2020-07-02 MED ORDER — ONETOUCH VERIO W/DEVICE KIT: 1.0000 | PACK | Freq: Every day | 0 refills | Status: DC

## 2020-07-02 MED ORDER — ONETOUCH ULTRASOFT LANCETS MISC
12 refills | Status: DC
Start: 2020-07-02 — End: 2023-08-20

## 2020-07-02 MED ORDER — ONETOUCH VERIO VI STRP: ORAL_STRIP | 12 refills | Status: DC

## 2020-07-02 NOTE — Addendum Note (Signed)
Addended by: Jearld Fenton on: 07/02/2020 12:40 PM   Modules accepted: Orders

## 2020-07-02 NOTE — Telephone Encounter (Signed)
  Notes to clinic: Script Clarification:NEED SPECIFIC TESTING INSTRUCTIONS   Requested Prescriptions  Pending Prescriptions Disp Refills   ONETOUCH VERIO test strip [Pharmacy Med Name: ONE TOUCH VERIO TEST STRIP] 100 strip 12    Sig: Use as instructed      Endocrinology: Diabetes - Testing Supplies Passed - 07/02/2020 12:54 PM      Passed - Valid encounter within last 12 months    Recent Outpatient Visits           5 days ago Type 2 diabetes mellitus with hyperglycemia, without long-term current use of insulin ([place])   [facility], Coralie Keens, NP       Future Appointments             In 3 months Baity, Coralie Keens, NP [facility], [facility]

## 2020-07-03 ENCOUNTER — Encounter: Payer: Self-pay | Admitting: Internal Medicine

## 2020-07-04 MED ORDER — EMPAGLIFLOZIN 25 MG PO TABS
25.0000 mg | ORAL_TABLET | Freq: Every day | ORAL | 1 refills | Status: DC
Start: 2020-07-04 — End: 2021-01-14

## 2020-07-15 ENCOUNTER — Other Ambulatory Visit: Payer: Self-pay | Admitting: Internal Medicine

## 2020-07-23 ENCOUNTER — Ambulatory Visit: Payer: Self-pay | Admitting: Internal Medicine

## 2020-07-25 LAB — HM DIABETES EYE EXAM

## 2020-10-03 ENCOUNTER — Ambulatory Visit: Payer: Managed Care, Other (non HMO) | Admitting: Internal Medicine

## 2020-10-03 ENCOUNTER — Encounter: Payer: Self-pay | Admitting: Internal Medicine

## 2020-10-03 ENCOUNTER — Other Ambulatory Visit: Payer: Self-pay

## 2020-10-03 VITALS — BP 124/71 | HR 82 | Temp 97.7°F | Resp 17 | Ht 69.0 in | Wt 234.8 lb

## 2020-10-03 DIAGNOSIS — E782 Mixed hyperlipidemia: Secondary | ICD-10-CM

## 2020-10-03 DIAGNOSIS — Z23 Encounter for immunization: Secondary | ICD-10-CM

## 2020-10-03 DIAGNOSIS — E1165 Type 2 diabetes mellitus with hyperglycemia: Secondary | ICD-10-CM

## 2020-10-03 DIAGNOSIS — I10 Essential (primary) hypertension: Secondary | ICD-10-CM

## 2020-10-03 DIAGNOSIS — Z6834 Body mass index (BMI) 34.0-34.9, adult: Secondary | ICD-10-CM

## 2020-10-03 DIAGNOSIS — E6609 Other obesity due to excess calories: Secondary | ICD-10-CM

## 2020-10-03 LAB — POCT GLYCOSYLATED HEMOGLOBIN (HGB A1C): Hemoglobin A1C: 6.5 % — AB (ref 4.0–5.6)

## 2020-10-03 NOTE — Assessment & Plan Note (Signed)
Encouraged him to consume a low fat diet Continue Rosuvastatin and advised him to start taking his Fenofibrate We will check lipid profile at next visit

## 2020-10-03 NOTE — Progress Notes (Signed)
Subjective:    Patient ID: Jerry Reid, male    DOB: 09-13-1975, 45 y.o.   MRN: [id number]  HPI  Patient presents the clinic today for follow-up of chronic conditions.  DM2: His last A1c was 7.8%, 06/2020.  He is taking Glipizide and Jardiance as prescribed.  He does not check his sugars.  He checks his feet routinely.  His last eye exam was 07/2020.  He does not take flu shots.  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  Arboriculturist.  HTN: His BP today is 124/71.  He is taking Lisinopril as prescribed.  ECG from 07/2010 reviewed.  HLD: His last LDL was 65, triglycerides 298, 06/2020.  He is taking Rosuvastatin but not Fenofibrate as prescribed.  He tries to consume a low-fat diet.  Review of Systems     Past Medical History:  Diagnosis Date   Chest pain    Chicken pox    Cholelithiasis    Diabetes mellitus without complication (HCC)    Dysplastic nevus    sees Dr. Evorn Gong PAC [place]    HTN (hypertension)    Hyperlipidemia    Kidney stones     Current Outpatient Medications  Medication Sig Dispense Refill   Blood Glucose Monitoring Suppl (ONETOUCH VERIO) w/Device KIT 1 Device by Does not apply route daily. 1 kit 0   empagliflozin (JARDIANCE) 25 MG TABS tablet Take 1 tablet (25 mg total) by mouth daily before breakfast. 90 tablet 1   fenofibrate micronized (LOFIBRA) 67 MG capsule TAKE 1 CAPSULE (67 MG TOTAL) BY MOUTH DAILY BEFORE BREAKFAST. 30 capsule 5   glipiZIDE (GLUCOTROL XL) 10 MG 24 hr tablet Take 1 tablet (10 mg total) by mouth daily with breakfast. 90 tablet 0   glucose blood (ONETOUCH VERIO) test strip 1 each by Other route in the morning and at bedtime. Use as instructed 100 strip 12   Lancets (ONETOUCH ULTRASOFT) lancets Use as instructed 100 each 12   lisinopril (ZESTRIL) 10 MG tablet TAKE 1 TABLET BY MOUTH EVERY DAY 90 tablet 0   rosuvastatin (CRESTOR) 5 MG tablet TAKE 1 TABLET (5 MG TOTAL) BY MOUTH DAILY. 90 tablet 0   No current facility-administered medications for this visit.     Allergies  Allergen Reactions   Metformin And Related Diarrhea    Family History  Problem Relation Age of Onset   Heart attack Mother    Heart disease Mother    Hyperlipidemia Mother    Hypertension Mother    Diabetes Mother    Hypertension Father    Heart disease Father    Hyperlipidemia Father    Diabetes Father     Social History   Socioeconomic History   Marital status: Married    Spouse name: Not on file   Number of children: Not on file   Years of education: Not on file   Highest education level: Not on file  Occupational History   Not on file  Tobacco Use   Smoking status: Never   Smokeless tobacco: Never  Vaping Use   Vaping Use: Never used  Substance and Sexual Activity   Alcohol use: No   Drug use: No   Sexual activity: Not on file  Other Topics Concern   Not on file  Social History Narrative   Works for the county    Married 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 Health and safety inspector   Social Determinants of Health   Financial Resource Strain: Not on file  Food Insecurity: Not on file  Transportation Needs: Not  on file  Physical Activity: Not on file  Stress: Not on file  Social Connections: Not on file  Intimate Partner Violence: Not on file     Constitutional: Pt reports intentional weight loss. Denies fever, malaise, fatigue, headache.  HEENT: Denies eye pain, eye redness, ear pain, ringing in the ears, wax buildup, runny nose, nasal congestion, bloody nose, or sore throat. Respiratory: Denies difficulty breathing, shortness of breath, cough or sputum production.   Cardiovascular: Denies chest pain, chest tightness, palpitations or swelling in the hands or feet.  Gastrointestinal: Denies abdominal pain, bloating, constipation, diarrhea or blood in the stool.  GU: Denies urgency, frequency, pain with urination, burning sensation, blood in urine, odor or discharge. Skin: Denies redness, rashes, lesions or ulcercations.  Neurological: Denies numbness, tingling,  dizziness, difficulty with memory, difficulty with speech or problems with balance and coordination.    No other specific complaints in a complete review of systems (except as listed in HPI above).  Objective:   Physical Exam  BP 124/71 (BP Location: Left Arm, Patient Position: Sitting, Cuff Size: Large)   Pulse 82   Temp 97.7 F (36.5 C) (Temporal)   Resp 17   Ht 5' 9"  (1.753 m)   Wt 234 lb 12.8 oz (106.5 kg)   SpO2 100%   BMI 34.67 kg/m   Wt Readings from Last 3 Encounters:  06/27/20 242 lb 9.6 oz (110 kg)  06/18/20 238 lb (108 kg)  01/04/20 231 lb (104.8 kg)    General: Appears his stated age, obese, in NAD. Skin: Warm, dry and intact. No ulcerations noted. HEENT: Head: normal shape and size; Eyes: sclera white and EOMs intact;  Cardiovascular: Normal rate and rhythm. S1,S2 noted.  No murmur, rubs or gallops noted. No JVD or BLE edema. No carotid bruits noted. Pulmonary/Chest: Normal effort and positive vesicular breath sounds. No respiratory distress. No wheezes, rales or ronchi noted.  Musculoskeletal: No difficulty with gait.  Neurological: Alert and oriented.   BMET    Component Value Date/Time   NA 135 06/28/2020 0756   NA 139 07/19/2019 1005   K 4.0 06/28/2020 0756   CL 103 06/28/2020 0756   CO2 23 06/28/2020 0756   GLUCOSE 247 (H) 06/28/2020 0756   BUN 11 06/28/2020 0756   BUN 15 07/19/2019 1005   CREATININE 0.94 06/28/2020 0756   CALCIUM 9.3 06/28/2020 0756   GFRNONAA 96 06/28/2020 0756   GFRAA 111 06/28/2020 0756    Lipid Panel     Component Value Date/Time   CHOL 128 06/28/2020 0756   CHOL 150 06/18/2020 1259   TRIG 298 (H) 06/28/2020 0756   HDL 32 (L) 06/28/2020 0756   HDL 29 (L) 06/18/2020 1259   CHOLHDL 4.0 06/28/2020 0756   LDLCALC 65 06/28/2020 0756    CBC    Component Value Date/Time   WBC 4.5 03/23/2018 0935   WBC 8.3 09/26/2015 0203   RBC 5.65 03/23/2018 0935   RBC 5.35 09/26/2015 0203   HGB 16.9 03/23/2018 0935   HCT 47.4  03/23/2018 0935   PLT 231 03/23/2018 0935   MCV 84 03/23/2018 0935   MCH 29.9 03/23/2018 0935   MCH 30.4 09/26/2015 0203   MCHC 35.7 03/23/2018 0935   MCHC 36.3 (H) 09/26/2015 0203   RDW 13.7 03/23/2018 0935   LYMPHSABS 1.3 03/23/2018 0935   EOSABS 0.0 03/23/2018 0935   BASOSABS 0.0 03/23/2018 0935    Hgb A1C Lab Results  Component Value Date   HGBA1C 7.8 (A)  06/27/2020            Assessment & Plan:   Webb Silversmith, NP This visit occurred during the SARS-CoV-2 public health emergency.  Safety protocols were in place, including screening questions prior to the visit, additional usage of staff PPE, and extensive cleaning of exam room while observing appropriate contact time as indicated for disinfecting solutions.

## 2020-10-03 NOTE — Assessment & Plan Note (Signed)
Controlled on Lisinopril Reinforced DASH diet and exercise for weight loss Will monitor

## 2020-10-03 NOTE — Assessment & Plan Note (Signed)
POCT A1c 6.5% Congratulated him on his weight loss Encourage low-carb diet and exercise for continued weight loss Continue Glipizide and Jardiance Encourage routine foot exam Will request copy of eye exam He declines flu shot today Pneumovax UTD Encouraged him to get his COVID booster

## 2020-10-03 NOTE — Assessment & Plan Note (Signed)
Congratulated him on his 8 pound weight loss since his last visit Encouraged continued diet and exercise for weight loss

## 2020-10-03 NOTE — Patient Instructions (Signed)
Carbohydrate Counting for Diabetes Mellitus, Adult Carbohydrate counting is a method of keeping track of how many carbohydrates you eat. Eating carbohydrates naturally increases the amount of sugar (glucose) in the blood. Counting how many carbohydrates you eat improves your blood glucose control, which helps you manage your diabetes. It is important to know how many carbohydrates you can safely have in each meal. This is different for every person. A dietitian can help you make a meal plan and calculate how many carbohydrates you should have at each meal and snack. What foods contain carbohydrates? Carbohydrates are found in the following foods: Grains, such as breads and cereals. Dried beans and soy products. Starchy vegetables, such as potatoes, peas, and corn. Fruit and fruit juices. Milk and yogurt. Sweets and snack foods, such as cake, cookies, candy, chips, and soft drinks. How do I count carbohydrates in foods? There are two ways to count carbohydrates in food. You can read food labels or learn standard serving sizes of foods. You can use either of the methods or a combination of both. Using the Nutrition Facts label The Nutrition Facts list is included on the labels of almost all packaged foods and beverages in the [place] It includes: The serving size. Information about nutrients in each serving, including the grams (g) of carbohydrate per serving. To use the Nutrition Facts: Decide how many servings you will have. Multiply the number of servings by the number of carbohydrates per serving. The resulting number is the total amount of carbohydrates that you will be having. Learning the standard serving sizes of foods When you eat carbohydrate foods that are not packaged or do not include Nutrition Facts on the label, you need to measure the servings in order to count the amount of carbohydrates. Measure the foods that you will eat with a food scale or measuring cup, if needed. Decide how  many standard-size servings you will eat. Multiply the number of servings by 15. For foods that contain carbohydrates, one serving equals 15 g of carbohydrates. For example, if you eat 2 cups or 10 oz (300 g) of strawberries, you will have eaten 2 servings and 30 g of carbohydrates (2 servings x 15 g = 30 g). For foods that have more than one food mixed, such as soups and casseroles, you must count the carbohydrates in each food that is included. The following list contains standard serving sizes of common carbohydrate-rich foods. Each of these servings has about 15 g of carbohydrates: 1 slice of bread. 1 six-inch (15 cm) tortilla. ? cup or 2 oz (53 g) cooked rice or pasta.  cup or 3 oz (85 g) cooked or canned, drained and rinsed beans or lentils.  cup or 3 oz (85 g) starchy vegetable, such as peas, corn, or squash.  cup or 4 oz (120 g) hot cereal.  cup or 3 oz (85 g) boiled or mashed potatoes, or  or 3 oz (85 g) of a large baked potato.  cup or 4 fl oz (118 mL) fruit juice. 1 cup or 8 fl oz (237 mL) milk. 1 small or 4 oz (106 g) apple.  or 2 oz (63 g) of a medium banana. 1 cup or 5 oz (150 g) strawberries. 3 cups or 1 oz (24 g) popped popcorn. What is an example of carbohydrate counting? To calculate the number of carbohydrates in this sample meal, follow the steps shown below. Sample meal 3 oz (85 g) chicken breast. ? cup or 4 oz (106 g) brown   rice.  cup or 3 oz (85 g) corn. 1 cup or 8 fl oz (237 mL) milk. 1 cup or 5 oz (150 g) strawberries with sugar-free whipped topping. Carbohydrate calculation Identify the foods that contain carbohydrates: Rice. Corn. Milk. Strawberries. Calculate how many servings you have of each food: 2 servings rice. 1 serving corn. 1 serving milk. 1 serving strawberries. Multiply each number of servings by 15 g: 2 servings rice x 15 g = 30 g. 1 serving corn x 15 g = 15 g. 1 serving milk x 15 g = 15 g. 1 serving strawberries x 15 g = 15  g. Add together all of the amounts to find the total grams of carbohydrates eaten: 30 g + 15 g + 15 g + 15 g = 75 g of carbohydrates total. What are tips for following this plan? Shopping Develop a meal plan and then make a shopping list. Buy fresh and frozen vegetables, fresh and frozen fruit, dairy, eggs, beans, lentils, and whole grains. Look at food labels. Choose foods that have more fiber and less sugar. Avoid processed foods and foods with added sugars. Meal planning Aim to have the same amount of carbohydrates at each meal and for each snack time. Plan to have regular, balanced meals and snacks. Where to find more information American Diabetes Association: www.diabetes.org Centers for Disease Control and Prevention: www.cdc.gov Summary Carbohydrate counting is a method of keeping track of how many carbohydrates you eat. Eating carbohydrates naturally increases the amount of sugar (glucose) in the blood. Counting how many carbohydrates you eat improves your blood glucose control, which helps you manage your diabetes. A dietitian can help you make a meal plan and calculate how many carbohydrates you should have at each meal and snack. This information is not intended to replace advice given to you by your health care provider. Make sure you discuss any questions you have with your health care provider. Document Revised: 01/05/2019 Document Reviewed: 01/06/2019 Elsevier Patient Education  2021 Elsevier Inc.  

## 2020-10-07 ENCOUNTER — Other Ambulatory Visit: Payer: Self-pay | Admitting: Internal Medicine

## 2020-10-07 MED ORDER — GLIPIZIDE ER 10 MG PO TB24: 10.0000 mg | ORAL_TABLET | Freq: Every day | ORAL | 0 refills | Status: DC

## 2020-10-07 MED ORDER — ROSUVASTATIN CALCIUM 5 MG PO TABS: 5.0000 mg | ORAL_TABLET | Freq: Every day | ORAL | 0 refills | Status: DC

## 2020-10-22 ENCOUNTER — Encounter: Payer: Self-pay | Admitting: Internal Medicine

## 2020-10-22 ENCOUNTER — Other Ambulatory Visit: Payer: Self-pay | Admitting: Internal Medicine

## 2020-10-23 MED ORDER — LISINOPRIL 10 MG PO TABS: 10.0000 mg | ORAL_TABLET | Freq: Every day | ORAL | 0 refills | Status: DC

## 2021-01-09 ENCOUNTER — Encounter: Payer: Self-pay | Admitting: Internal Medicine

## 2021-01-09 ENCOUNTER — Ambulatory Visit: Payer: Managed Care, Other (non HMO) | Admitting: Internal Medicine

## 2021-01-09 ENCOUNTER — Other Ambulatory Visit: Payer: Self-pay

## 2021-01-09 VITALS — BP 117/71 | HR 80 | Temp 97.3°F | Resp 17 | Ht 69.0 in | Wt 233.6 lb

## 2021-01-09 DIAGNOSIS — E782 Mixed hyperlipidemia: Secondary | ICD-10-CM

## 2021-01-09 DIAGNOSIS — E6609 Other obesity due to excess calories: Secondary | ICD-10-CM | POA: Diagnosis not present

## 2021-01-09 DIAGNOSIS — I10 Essential (primary) hypertension: Secondary | ICD-10-CM

## 2021-01-09 DIAGNOSIS — Z6834 Body mass index (BMI) 34.0-34.9, adult: Secondary | ICD-10-CM

## 2021-01-09 DIAGNOSIS — E119 Type 2 diabetes mellitus without complications: Secondary | ICD-10-CM | POA: Diagnosis not present

## 2021-01-09 NOTE — Assessment & Plan Note (Signed)
Encourage diet and exercise for weight loss 

## 2021-01-09 NOTE — Patient Instructions (Signed)

## 2021-01-09 NOTE — Assessment & Plan Note (Signed)
A1c and urine microalbumin today Encouraged him to consume a low-carb diet and exercise for weight loss Continue Glipizide and Jardiance Encourage routine eye exams Encourage routine foot exams He declines flu shot Pneumovax UTD Encouraged him to get his COVID booster

## 2021-01-09 NOTE — Progress Notes (Signed)
Subjective:    Patient ID: Jerry Reid, male    DOB: 16-Sep-1975, 45 y.o.   MRN: [id number]  HPI  Patient presents to clinic today for follow-up of chronic conditions.  DM2: His last A1c was 6.5%.  He is taking Glipizide and Jardiance as prescribed.  He does not check his sugars.  He checks his feet routinely.  His last eye exam was 07/2020.  He declines flu shots.  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  Arboriculturist.  HTN: His BP today is 117/71.  He is taking Lisinopril as prescribed.  ECG from 07/2010 reviewed.  HLD: His last LDL was 65, triglycerides 298, 06/2020.  He is taking Rosuvastatin and Fenofibrate as prescribed.  He tries to consume a low-fat diet.  Review of Systems     Past Medical History:  Diagnosis Date   Chest pain    Chicken pox    Cholelithiasis    Diabetes mellitus without complication (HCC)    Dysplastic nevus    sees Dr. Evorn Gong PAC [place]    HTN (hypertension)    Hyperlipidemia    Kidney stones     Current Outpatient Medications  Medication Sig Dispense Refill   Blood Glucose Monitoring Suppl (ONETOUCH VERIO) w/Device KIT 1 Device by Does not apply route daily. 1 kit 0   empagliflozin (JARDIANCE) 25 MG TABS tablet Take 1 tablet (25 mg total) by mouth daily before breakfast. 90 tablet 1   fenofibrate micronized (LOFIBRA) 67 MG capsule TAKE 1 CAPSULE (67 MG TOTAL) BY MOUTH DAILY BEFORE BREAKFAST. 30 capsule 5   glipiZIDE (GLUCOTROL XL) 10 MG 24 hr tablet Take 1 tablet (10 mg total) by mouth daily with breakfast. 90 tablet 0   glucose blood (ONETOUCH VERIO) test strip 1 each by Other route in the morning and at bedtime. Use as instructed 100 strip 12   Lancets (ONETOUCH ULTRASOFT) lancets Use as instructed 100 each 12   lisinopril (ZESTRIL) 10 MG tablet Take 1 tablet (10 mg total) by mouth daily. 90 tablet 0   rosuvastatin (CRESTOR) 5 MG tablet Take 1 tablet (5 mg total) by mouth daily. 90 tablet 0   No current facility-administered medications for this visit.     Allergies  Allergen Reactions   Metformin And Related Diarrhea    Family History  Problem Relation Age of Onset   Heart attack Mother    Heart disease Mother    Hyperlipidemia Mother    Hypertension Mother    Diabetes Mother    Hypertension Father    Heart disease Father    Hyperlipidemia Father    Diabetes Father     Social History   Socioeconomic History   Marital status: Married    Spouse name: Not on file   Number of children: Not on file   Years of education: Not on file   Highest education level: Not on file  Occupational History   Not on file  Tobacco Use   Smoking status: Never   Smokeless tobacco: Never  Vaping Use   Vaping Use: Never used  Substance and Sexual Activity   Alcohol use: No   Drug use: No   Sexual activity: Not on file  Other Topics Concern   Not on file  Social History Narrative   Works for the county    Married 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 Health and safety inspector   Social Determinants of Health   Financial Resource Strain: Not on file  Food Insecurity: Not on file  Transportation Needs: Not on file  Physical Activity: Not on file  Stress: Not on file  Social Connections: Not on file  Intimate Partner Violence: Not on file     Constitutional: Denies fever, malaise, fatigue, headache or abrupt weight changes.  Respiratory: Denies difficulty breathing, shortness of breath, cough or sputum production.   Cardiovascular: Denies chest pain, chest tightness, palpitations or swelling in the hands or feet.  Gastrointestinal: Denies abdominal pain, bloating, constipation, diarrhea or blood in the stool.  GU: Denies urgency, frequency, pain with urination, burning sensation, blood in urine, odor or discharge. Musculoskeletal: Denies decrease in range of motion, difficulty with gait, muscle pain or joint pain and swelling.  Skin: Denies redness, rashes, lesions or ulcercations.  Neurological: Denies dizziness, difficulty with memory, difficulty with speech or  problems with balance and coordination.    No other specific complaints in a complete review of systems (except as listed in HPI above).  Objective:   Physical Exam   BP 117/71 (BP Location: Right Arm, Patient Position: Sitting, Cuff Size: Large)    Pulse 80    Temp (!) 97.3 F (36.3 C) (Temporal)    Resp 17    Ht 5' 9"  (1.753 m)    Wt 233 lb 9.6 oz (106 kg)    SpO2 99%    BMI 34.50 kg/m   Wt Readings from Last 3 Encounters:  10/03/20 234 lb 12.8 oz (106.5 kg)  06/27/20 242 lb 9.6 oz (110 kg)  06/18/20 238 lb (108 kg)    General: Appears his stated age, obese in NAD. Skin: Warm, dry and intact. No  ulcerations noted. HEENT: Head: normal shape and size; Eyes: sclera white and EOMs intact;  Cardiovascular: Normal rate and rhythm. S1,S2 noted.  No murmur, rubs or gallops noted. No JVD or BLE edema. No carotid bruits noted. Pulmonary/Chest: Normal effort and positive vesicular breath sounds. No respiratory distress. No wheezes, rales or ronchi noted.  Musculoskeletal:  No difficulty with gait.  Neurological: Alert and oriented.    BMET    Component Value Date/Time   NA 135 06/28/2020 0756   NA 139 07/19/2019 1005   K 4.0 06/28/2020 0756   CL 103 06/28/2020 0756   CO2 23 06/28/2020 0756   GLUCOSE 247 (H) 06/28/2020 0756   BUN 11 06/28/2020 0756   BUN 15 07/19/2019 1005   CREATININE 0.94 06/28/2020 0756   CALCIUM 9.3 06/28/2020 0756   GFRNONAA 96 06/28/2020 0756   GFRAA 111 06/28/2020 0756    Lipid Panel     Component Value Date/Time   CHOL 128 06/28/2020 0756   CHOL 150 06/18/2020 1259   TRIG 298 (H) 06/28/2020 0756   HDL 32 (L) 06/28/2020 0756   HDL 29 (L) 06/18/2020 1259   CHOLHDL 4.0 06/28/2020 0756   LDLCALC 65 06/28/2020 0756    CBC    Component Value Date/Time   WBC 4.5 03/23/2018 0935   WBC 8.3 09/26/2015 0203   RBC 5.65 03/23/2018 0935   RBC 5.35 09/26/2015 0203   HGB 16.9 03/23/2018 0935   HCT 47.4 03/23/2018 0935   PLT 231 03/23/2018 0935   MCV  84 03/23/2018 0935   MCH 29.9 03/23/2018 0935   MCH 30.4 09/26/2015 0203   MCHC 35.7 03/23/2018 0935   MCHC 36.3 (H) 09/26/2015 0203   RDW 13.7 03/23/2018 0935   LYMPHSABS 1.3 03/23/2018 0935   EOSABS 0.0 03/23/2018 0935   BASOSABS 0.0 03/23/2018 0935    Hgb A1C Lab Results  Component Value Date  HGBA1C 6.5 (A) 10/03/2020           Assessment & Plan:     Webb Silversmith, NP This visit occurred during the SARS-CoV-2 public health emergency.  Safety protocols were in place, including screening questions prior to the visit, additional usage of staff PPE, and extensive cleaning of exam room while observing appropriate contact time as indicated for disinfecting solutions.

## 2021-01-09 NOTE — Assessment & Plan Note (Signed)
Controlled on Lisinopril Reinforced DASH diet and exercise for weight loss Will monitor

## 2021-01-09 NOTE — Assessment & Plan Note (Signed)
Lipid profile today Encouraged him to consume a low-fat diet Continue Rosuvastatin an Fenofibrate

## 2021-01-10 ENCOUNTER — Other Ambulatory Visit: Payer: Self-pay | Admitting: Internal Medicine

## 2021-01-10 LAB — MICROALBUMIN / CREATININE URINE RATIO
Creatinine, Urine: 61 mg/dL (ref 20–320)
Microalb, Ur: <0.2 mg/dL

## 2021-01-10 LAB — LIPID PANEL
Cholesterol: 161 mg/dL (ref <200)
HDL: 31 mg/dL — ABNORMAL LOW (ref >=40)
Non-HDL Cholesterol (Calc): 130 mg/dL (calc) — ABNORMAL HIGH (ref <130)
Total CHOL/HDL Ratio: 5.2 (calc) — ABNORMAL HIGH (ref <5.0)
Triglycerides: 416 mg/dL — ABNORMAL HIGH (ref <150)

## 2021-01-10 LAB — HEMOGLOBIN A1C
Hgb A1c MFr Bld: 6.7 % of total Hgb — ABNORMAL HIGH (ref <5.7)
Mean Plasma Glucose: 146 mg/dL
eAG (mmol/L): 8.1 mmol/L

## 2021-01-10 NOTE — Telephone Encounter (Signed)
Requested medication (s) are due for refill today: yes   Requested medication (s) are on the active medication list: yes   Last refill:  10/07/20 #90/0 RF, 10/07/20 #90/1RF   Future visit scheduled: No   Notes to clinic:  Unable to refill per protocol, medication not assigned to the refill protocol.     Requested Prescriptions  Pending Prescriptions Disp Refills   rosuvastatin (CRESTOR) 5 MG tablet [Pharmacy Med Name: ROSUVASTATIN CALCIUM 5 MG TAB] 90 tablet 0    Sig: Take 1 tablet (5 mg total) by mouth daily.     There is no refill protocol information for this order     JARDIANCE 25 MG TABS tablet [Pharmacy Med Name: JARDIANCE 25 MG TABLET] 90 tablet 1    Sig: TAKE 1 TABLET BY MOUTH DAILY BEFORE BREAKFAST.     There is no refill protocol information for this order

## 2021-01-10 NOTE — Telephone Encounter (Signed)
Requested medication (s) are due for refill today: yes  Requested medication (s) are on the active medication list: yes  Last refill:  10/07/20 #90/0 RF, 10/07/20 #90/1RF  Future visit scheduled: No  Notes to clinic:  Unable to refill per protocol, medication not assigned to the refill protocol.      Requested Prescriptions  Pending Prescriptions Disp Refills   rosuvastatin (CRESTOR) 5 MG tablet [Pharmacy Med Name: ROSUVASTATIN CALCIUM 5 MG TAB] 90 tablet 0    Sig: Take 1 tablet (5 mg total) by mouth daily.     There is no refill protocol information for this order     JARDIANCE 25 MG TABS tablet [Pharmacy Med Name: JARDIANCE 25 MG TABLET] 90 tablet 1    Sig: TAKE 1 TABLET BY MOUTH DAILY BEFORE BREAKFAST.     There is no refill protocol information for this order

## 2021-01-24 ENCOUNTER — Encounter: Payer: Self-pay | Admitting: Internal Medicine

## 2021-01-24 MED ORDER — FENOFIBRATE 145 MG PO TABS: 145.0000 mg | ORAL_TABLET | Freq: Every day | ORAL | 1 refills | Status: DC

## 2021-01-24 MED ORDER — GLIPIZIDE ER 10 MG PO TB24: 10.0000 mg | ORAL_TABLET | Freq: Every day | ORAL | 1 refills | Status: DC

## 2021-02-12 ENCOUNTER — Other Ambulatory Visit: Payer: Self-pay | Admitting: Internal Medicine

## 2021-02-12 NOTE — Telephone Encounter (Signed)
Call to pharmacy- patient has RF on Rosuvastatin- not needed. Requested Prescriptions  Pending Prescriptions Disp Refills   rosuvastatin (CRESTOR) 5 MG tablet [Pharmacy Med Name: ROSUVASTATIN CALCIUM 5 MG TAB] 90 tablet 1    Sig: TAKE 1 TABLET (5 MG TOTAL) BY MOUTH DAILY.     Cardiovascular:  Antilipid - Statins Failed - 02/12/2021 11:45 AM      Failed - HDL in normal range and within 360 days    HDL  Date Value Ref Range Status  01/09/2021 31 (L) > OR = 40 mg/dL Final  06/18/2020 29 (L) >39 mg/dL Final         Failed - Triglycerides in normal range and within 360 days    Triglycerides  Date Value Ref Range Status  01/09/2021 416 (H) <150 mg/dL Final    Comment:    . If a non-fasting specimen was collected, consider repeat triglyceride testing on a fasting specimen if clinically indicated.  Yates Decamp et al. J. of Clin. Lipidol. 1324;4:010-272. Marland Kitchen          Passed - Total Cholesterol in normal range and within 360 days    Cholesterol, Total  Date Value Ref Range Status  06/18/2020 150 100 - 199 mg/dL Final   Cholesterol  Date Value Ref Range Status  01/09/2021 161 <200 mg/dL Final         Passed - LDL in normal range and within 360 days    LDL Cholesterol (Calc)  Date Value Ref Range Status  01/09/2021  mg/dL (calc) Final    Comment:    . LDL cholesterol not calculated. Triglyceride levels greater than 400 mg/dL invalidate calculated LDL results. . Reference range: <100 . Desirable range <100 mg/dL for primary prevention;   <70 mg/dL for patients with CHD or diabetic patients  with > or = 2 CHD risk factors. Marland Kitchen LDL-C is now calculated using the Martin-Hopkins  calculation, which is a validated novel method providing  better accuracy than the Friedewald equation in the  estimation of LDL-C.  Cresenciano Genre et al. Annamaria Helling. 5366;440(34): 2061-2068  (http://education.[employer].com/faq/FAQ164)    Direct LDL  Date Value Ref Range Status  01/04/2020 119.0 mg/dL  Final    Comment:    Optimal:  <100 mg/dLNear or Above Optimal:  100-129 mg/dLBorderline High:  130-159 mg/dLHigh:  160-189 mg/dLVery High:  >190 mg/dL         Passed - Patient is not pregnant      Passed - Valid encounter within last 12 months    Recent Outpatient Visits          1 month ago Type 2 diabetes mellitus without complication, without long-term current use of insulin ([place])   [facility] [place], [place] W, NP   4 months ago Type 2 diabetes mellitus with hyperglycemia, without long-term current use of insulin [facility])   [facility] [place], [place] W, NP   7 months ago Type 2 diabetes mellitus with hyperglycemia, without long-term current use of insulin [facility])   [facility] [place], Coralie Keens, NP      Future Appointments            In 4 months Baity, Coralie Keens, NP [facility]            lisinopril (ZESTRIL) 10 MG tablet [Pharmacy Med Name: LISINOPRIL 10 MG TABLET] 90 tablet 0    Sig: TAKE 1 TABLET BY MOUTH EVERY DAY     Cardiovascular:  ACE Inhibitors Failed - 02/12/2021 11:45 AM      Failed - Cr in normal range and within 180 days    Creat  Date Value Ref Range Status  06/28/2020 0.94 0.60 - 1.35 mg/dL Final   Creatinine, Urine  Date Value Ref Range Status  01/09/2021 61 20 - 320 mg/dL Final         Failed - K in normal range and within 180 days    Potassium  Date Value Ref Range Status  06/28/2020 4.0 3.5 - 5.3 mmol/L Final         Passed - Patient is not pregnant      Passed - Last BP in normal range    BP Readings from Last 1 Encounters:  01/09/21 117/71         Passed - Valid encounter within last 6 months    Recent Outpatient Visits          1 month ago Type 2 diabetes mellitus without complication, without long-term current use of insulin ([place])   [facility] [place], Coralie Keens, NP   4 months ago Type 2 diabetes mellitus with hyperglycemia, without long-term current use of  insulin [facility])   [facility] [place], Coralie Keens, NP   7 months ago Type 2 diabetes mellitus with hyperglycemia, without long-term current use of insulin [facility])   [facility] [place], Coralie Keens, NP      Future Appointments            In 4 months Baity, Coralie Keens, NP [facility]

## 2021-03-24 ENCOUNTER — Other Ambulatory Visit: Payer: Self-pay | Admitting: Internal Medicine

## 2021-03-24 MED ORDER — LISINOPRIL 10 MG PO TABS: 10.0000 mg | ORAL_TABLET | Freq: Every day | ORAL | 1 refills | Status: DC

## 2021-05-13 ENCOUNTER — Other Ambulatory Visit: Payer: Self-pay | Admitting: Internal Medicine

## 2021-05-14 NOTE — Telephone Encounter (Signed)
Rx 01/14/21 #90 1RF- 6 month supply ?Too soon ?Requested Prescriptions  ?Pending Prescriptions Disp Refills  ?? rosuvastatin (CRESTOR) 5 MG tablet [Pharmacy Med Name: ROSUVASTATIN CALCIUM 5 MG TAB] 90 tablet 1  ?  Sig: TAKE 1 TABLET (5 MG TOTAL) BY MOUTH DAILY.  ?  ? Cardiovascular:  Antilipid - Statins 2 Failed - 05/13/2021  2:52 AM  ?  ?  Failed - Lipid Panel in normal range within the last 12 months  ?  Cholesterol, Total  ?Date Value Ref Range Status  ?06/18/2020 150 100 - 199 mg/dL Final  ? ?Cholesterol  ?Date Value Ref Range Status  ?01/09/2021 161 <200 mg/dL Final  ? ?LDL Cholesterol (Calc)  ?Date Value Ref Range Status  ?01/09/2021  mg/dL (calc) Final  ?  Comment:  ?  . ?LDL cholesterol not calculated. Triglyceride levels ?greater than 400 mg/dL invalidate calculated LDL results. ?. ?Reference range: <100 ?Marland Kitchen ?Desirable range <100 mg/dL for primary prevention;   ?<70 mg/dL for patients with CHD or diabetic patients  ?with > or = 2 CHD risk factors. ?. ?LDL-C is now calculated using the Martin-Hopkins  ?calculation, which is a validated novel method providing  ?better accuracy than the Friedewald equation in the  ?estimation of LDL-C.  ?Cresenciano Genre et al. Annamaria Helling. 8101;751(02): 2061-2068  ?(http://education.[facility].com/faq/FAQ164) ?  ? ?Direct LDL  ?Date Value Ref Range Status  ?01/04/2020 119.0 mg/dL Final  ?  Comment:  ?  Optimal:  <100 mg/dLNear or Above Optimal:  100-129 mg/dLBorderline High:  130-159 mg/dLHigh:  160-189 mg/dLVery High:  >190 mg/dL  ? ?HDL  ?Date Value Ref Range Status  ?01/09/2021 31 (L) > OR = 40 mg/dL Final  ?06/18/2020 29 (L) >39 mg/dL Final  ? ?Triglycerides  ?Date Value Ref Range Status  ?01/09/2021 416 (H) <150 mg/dL Final  ?  Comment:  ?  . ?If a non-fasting specimen was collected, consider ?repeat triglyceride testing on a fasting specimen ?if clinically indicated.  ?Garald Balding al. J. of Clin. Lipidol. 5852;7:782-423. ?. ?  ? ?  ?  ?  Passed - Cr in normal range and within 360  days  ?  Creat  ?Date Value Ref Range Status  ?06/28/2020 0.94 0.60 - 1.35 mg/dL Final  ? ?Creatinine, Urine  ?Date Value Ref Range Status  ?01/09/2021 61 20 - 320 mg/dL Final  ?   ?  ?  Passed - Patient is not pregnant  ?  ?  Passed - Valid encounter within last 12 months  ?  Recent Outpatient Visits   ?      ? 4 months ago Type 2 diabetes mellitus without complication, without long-term current use of insulin ([place])  ? [facility] [place], [place] W, NP  ? 7 months ago Type 2 diabetes mellitus with hyperglycemia, without long-term current use of insulin ([place])  ? [facility] [place], [place] W, NP  ? 10 months ago Type 2 diabetes mellitus with hyperglycemia, without long-term current use of insulin ([place])  ? [facility] [place], Coralie Keens, NP  ?  ?  ?Future Appointments   ?        ? In 1 month Baity, Coralie Keens, NP [facility] [employer]  ?  ? ?  ?  ?  ? ?

## 2021-05-17 ENCOUNTER — Other Ambulatory Visit: Payer: Self-pay | Admitting: Internal Medicine

## 2021-05-19 NOTE — Telephone Encounter (Signed)
Requested Prescriptions  ?Pending Prescriptions Disp Refills  ?? JARDIANCE 25 MG TABS tablet [Pharmacy Med Name: JARDIANCE 25 MG TABLET] 90 tablet 0  ?  Sig: TAKE 1 TABLET BY MOUTH EVERY DAY BEFORE BREAKFAST  ?  ? Endocrinology:  Diabetes - SGLT2 Inhibitors Passed - 05/17/2021  2:46 AM  ?  ?  Passed - Cr in normal range and within 360 days  ?  Creat  ?Date Value Ref Range Status  ?06/28/2020 0.94 0.60 - 1.35 mg/dL Final  ? ?Creatinine, Urine  ?Date Value Ref Range Status  ?01/09/2021 61 20 - 320 mg/dL Final  ?   ?  ?  Passed - HBA1C is between 0 and 7.9 and within 180 days  ?  Hgb A1c MFr Bld  ?Date Value Ref Range Status  ?01/09/2021 6.7 (H) <5.7 % of total Hgb Final  ?  Comment:  ?  For someone without known diabetes, a hemoglobin A1c ?value of 6.5% or greater indicates that they may have  ?diabetes and this should be confirmed with a follow-up  ?test. ?. ?For someone with known diabetes, a value <7% indicates  ?that their diabetes is well controlled and a value  ?greater than or equal to 7% indicates suboptimal  ?control. A1c targets should be individualized based on  ?duration of diabetes, age, comorbid conditions, and  ?other considerations. ?. ?Currently, no consensus exists regarding use of ?hemoglobin A1c for diagnosis of diabetes for children. ?. ?  ?   ?  ?  Passed - eGFR in normal range and within 360 days  ?  GFR, Est African American  ?Date Value Ref Range Status  ?06/28/2020 111 > OR = 60 mL/min/1.60m Final  ? ?GFR, Est Non African American  ?Date Value Ref Range Status  ?06/28/2020 96 > OR = 60 mL/min/1.754mFinal  ? ?GFR  ?Date Value Ref Range Status  ?11/17/2018 80.55 >60.00 mL/min Final  ?   ?  ?  Passed - Valid encounter within last 6 months  ?  Recent Outpatient Visits   ?      ? 4 months ago Type 2 diabetes mellitus without complication, without long-term current use of insulin ([place] ? [facility] [place]  ? 7 months ago Type 2 diabetes mellitus with hyperglycemia,  without long-term current use of insulin ([place] ? [facility]  ? 10 months ago Type 2 diabetes mellitus with hyperglycemia, without long-term current use of insulin ([place] ? [facility]  ?  ?  ?Future Appointments   ?        ? In 1 month Baity, ReCoralie KeensNP [facility] [place]?  ? ?  ?  ?  ? ?

## 2021-05-22 ENCOUNTER — Encounter: Payer: Self-pay | Admitting: Internal Medicine

## 2021-05-26 ENCOUNTER — Encounter: Payer: Self-pay | Admitting: Internal Medicine

## 2021-05-26 ENCOUNTER — Telehealth (INDEPENDENT_AMBULATORY_CARE_PROVIDER_SITE_OTHER): Payer: Managed Care, Other (non HMO) | Admitting: Internal Medicine

## 2021-05-26 DIAGNOSIS — J029 Acute pharyngitis, unspecified: Secondary | ICD-10-CM

## 2021-05-26 DIAGNOSIS — R051 Acute cough: Secondary | ICD-10-CM

## 2021-05-26 MED ORDER — HYDROCOD POLI-CHLORPHE POLI ER 10-8 MG/5ML PO SUER: 5.0000 mL | Freq: Two times a day (BID) | ORAL | 0 refills | Status: DC | PRN

## 2021-05-26 MED ORDER — PREDNISONE 10 MG PO TABS: ORAL_TABLET | ORAL | 0 refills | Status: DC

## 2021-05-26 NOTE — Progress Notes (Signed)
Virtual Visit via Video Note ? ?I connected with Jerry Reid on 05/26/21 at 10:40 AM EDT by a video enabled telemedicine application and verified that I am speaking with the correct person using two identifiers. ? ?Location: ?Patient: Home ?Provider: Office ? ?Person's participating in this video call: Webb Silversmith, NP and Jerry Reid. ?  ?I discussed the limitations of evaluation and management by telemedicine and the availability of in person appointments. The patient expressed understanding and agreed to proceed. ? ?History of Present Illness: ? ?Pt reports sore throat and cough. This started 1 week ago. He has had some difficulty swallowing. The cough is dry and nonproductive, worse when he lays down at night. He denise headache, runny nose, ear pain, shortness of breath, nausea, vomiting or diarrhea. He denies fever, chills or body aches. [facility] strep test was negative, prescribed Ibuprofen. He did not take a covid test. He has also tried Dayquil and Nyquil OTC with minimal relief of symptoms. He has not had sick contacts. He typically does not have bad allergies. ? ? ?Past Medical History:  ?Diagnosis Date  ? Chest pain   ? Chicken pox   ? Cholelithiasis   ? Diabetes mellitus without complication ([place])   ? Dysplastic nevus   ? sees Dr. Evorn Gong [facility]   ? HTN (hypertension)   ? Hyperlipidemia   ? Kidney stones   ? ? ?Current Outpatient Medications  ?Medication Sig Dispense Refill  ? Blood Glucose Monitoring Suppl (ONETOUCH VERIO) w/Device KIT 1 Device by Does not apply route daily. 1 kit 0  ? fenofibrate (TRICOR) 145 MG tablet Take 1 tablet (145 mg total) by mouth daily. 90 tablet 1  ? glipiZIDE (GLUCOTROL XL) 10 MG 24 hr tablet Take 1 tablet (10 mg total) by mouth daily with breakfast. 90 tablet 1  ? glucose blood (ONETOUCH VERIO) test strip 1 each by Other route in the morning and at bedtime. Use as instructed 100 strip 12  ? JARDIANCE 25 MG TABS tablet TAKE 1 TABLET BY  MOUTH EVERY DAY BEFORE BREAKFAST 90 tablet 0  ? Lancets (ONETOUCH ULTRASOFT) lancets Use as instructed 100 each 12  ? lisinopril (ZESTRIL) 10 MG tablet Take 1 tablet (10 mg total) by mouth daily. 90 tablet 1  ? rosuvastatin (CRESTOR) 5 MG tablet TAKE 1 TABLET (5 MG TOTAL) BY MOUTH DAILY. 90 tablet 1  ? ?No current facility-administered medications for this visit.  ? ? ?Allergies  ?Allergen Reactions  ? Metformin And Related Diarrhea  ? ? ?Family History  ?Problem Relation Age of Onset  ? Heart attack Mother   ? Heart disease Mother   ? Hyperlipidemia Mother   ? Hypertension Mother   ? Diabetes Mother   ? Hypertension Father   ? Heart disease Father   ? Hyperlipidemia Father   ? Diabetes Father   ? ? ?Social History  ? ?Socioeconomic History  ? Marital status: Married  ?  Spouse name: Not on file  ? Number of children: Not on file  ? Years of education: Not on file  ? Highest education level: Not on file  ?Occupational History  ? Not on file  ?Tobacco Use  ? Smoking status: Never  ? Smokeless tobacco: Never  ?Vaping Use  ? Vaping Use: Never used  ?Substance and Sexual Activity  ? Alcohol use: No  ? Drug use: No  ? Sexual activity: Not on file  ?Other Topics Concern  ? Not on file  ?  Social History Narrative  ? Works for [employer]   ? Married 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 Principal  ? ?Social Determinants of Health  ? ?Financial Resource Strain: Not on file  ?Food Insecurity: Not on file  ?Transportation Needs: Not on file  ?Physical Activity: Not on file  ?Stress: Not on file  ?Social Connections: Not on file  ?Intimate Partner Violence: Not on file  ? ? ? ?Constitutional: Denies fever, malaise, fatigue, headache or abrupt weight changes.  ?HEENT: Pt reports sore throat. Denies eye pain, eye redness, ear pain, ringing in the ears, wax buildup, runny nose, nasal congestion, bloody nose. ?Respiratory: Pt reports cough. Denies difficulty breathing, shortness of breath, or sputum production.   ?Cardiovascular: Denies chest pain,  chest tightness, palpitations or swelling in the hands or feet.  ?Gastrointestinal: Denies abdominal pain, bloating, constipation, diarrhea or blood in the stool.  ? ?No other specific complaints in a complete review of systems (except as listed in HPI above). ? ?  ?Observations/Objective: ? ?Wt Readings from Last 3 Encounters:  ?01/09/21 233 lb 9.6 oz (106 kg)  ?10/03/20 234 lb 12.8 oz (106.5 kg)  ?06/27/20 242 lb 9.6 oz (110 kg)  ? ? ?General: Appears his stated age, in NAD. ?HEENT: Head: normal shape and size; Nose: no congestion noted; Throat/Mouth: hoarseness noted.  ?Pulmonary/Chest: Normal effort No respiratory distress. ?Neurological: Alert and oriented.  ? ?BMET ?   ?Component Value Date/Time  ? NA 135 06/28/2020 0756  ? NA 139 07/19/2019 1005  ? K 4.0 06/28/2020 0756  ? CL 103 06/28/2020 0756  ? CO2 23 06/28/2020 0756  ? GLUCOSE 247 (H) 06/28/2020 0756  ? BUN 11 06/28/2020 0756  ? BUN 15 07/19/2019 1005  ? CREATININE 0.94 06/28/2020 0756  ? CALCIUM 9.3 06/28/2020 0756  ? GFRNONAA 96 06/28/2020 0756  ? GFRAA 111 06/28/2020 0756  ? ? ?Lipid Panel  ?   ?Component Value Date/Time  ? CHOL 161 01/09/2021 0816  ? CHOL 150 06/18/2020 1259  ? TRIG 416 (H) 01/09/2021 0816  ? HDL 31 (L) 01/09/2021 0816  ? HDL 29 (L) 06/18/2020 1259  ? CHOLHDL 5.2 (H) 01/09/2021 0816  ? [place]  01/09/2021 0816  ?   Comment:  ?   . ?LDL cholesterol not calculated. Triglyceride levels ?greater than 400 mg/dL invalidate calculated LDL results. ?. ?Reference range: <100 ?Marland Kitchen ?Desirable range <100 mg/dL for primary prevention;   ?<70 mg/dL for patients with CHD or diabetic patients  ?with > or = 2 CHD risk factors. ?. ?LDL-C is now calculated using the Martin-Hopkins  ?calculation, which is a validated novel method providing  ?better accuracy than the Friedewald equation in the  ?estimation of LDL-C.  ?Cresenciano Genre et al. Annamaria Helling. 0272;536(64): 2061-2068  ?(http://education.[employer].com/faq/FAQ164) ?  ? ? ?CBC ?   ?Component Value  Date/Time  ? WBC 4.5 03/23/2018 0935  ? WBC 8.3 09/26/2015 0203  ? RBC 5.65 03/23/2018 0935  ? RBC 5.35 09/26/2015 0203  ? HGB 16.9 03/23/2018 0935  ? HCT 47.4 03/23/2018 0935  ? PLT 231 03/23/2018 0935  ? MCV 84 03/23/2018 0935  ? MCH 29.9 03/23/2018 0935  ? MCH 30.4 09/26/2015 0203  ? MCHC 35.7 03/23/2018 0935  ? MCHC 36.3 (H) 09/26/2015 0203  ? RDW 13.7 03/23/2018 0935  ? LYMPHSABS 1.3 03/23/2018 0935  ? EOSABS 0.0 03/23/2018 0935  ? BASOSABS 0.0 03/23/2018 0935  ? ? ?Hgb A1C ?Lab Results  ?Component Value Date  ? HGBA1C 6.7 (H) 01/09/2021  ? ? ? ? ? ?  Assessment and Plan: ? ?Sore Throat and Cough: ? ?Does not appear infectious, will hold off on abx at this time ?RX for Pred taper x 6 days, could be allergy/viral related ?RX for Tussionex cough syrup- sedation caution given ?Try an antihistamine OTC daily x 7 days ? ?Return precautions discussed ? ?Follow Up Instructions: ? ?  ?I discussed the assessment and treatment plan with the patient. The patient was provided an opportunity to ask questions and all were answered. The patient agreed with the plan and demonstrated an understanding of the instructions. ?  ?The patient was advised to call back or seek an in-person evaluation if the symptoms worsen or if the condition fails to improve as anticipated. ? ? ?Webb Silversmith, NP ? ?

## 2021-05-26 NOTE — Patient Instructions (Signed)
Sore Throat When you have a sore throat, your throat may feel: Tender. Burning. Irritated. Scratchy. Painful when you swallow. Painful when you talk. Many things can cause a sore throat, such as: An infection. Allergies. Dry air. Smoke or pollution. Radiation treatment for cancer. Gastroesophageal reflux disease (GERD). A tumor. A sore throat can be the first sign of another sickness. It can happen with other problems, like: Coughing. Sneezing. Fever. Swelling of the glands in the neck. Most sore throats go away without treatment. Follow these instructions at home:     Medicines Take over-the-counter and prescription medicines only as told by your doctor. Children often get sore throats. Do not give your child aspirin. Use throat sprays to soothe your throat as told by your health care provider. Managing pain To help with pain: Sip warm liquids, such as broth, herbal tea, or warm water. Eat or drink cold or frozen liquids, such as frozen ice pops. Rinse your mouth (gargle) with a salt water mixture 3-4 times a day or as needed. To make salt water, dissolve -1 tsp (3-6 g) of salt in 1 cup (237 mL) of warm water. Do not swallow this mixture. Suck on hard candy or throat lozenges. Put a cool-mist humidifier in your bedroom at night. Sit in the bathroom with the door closed for 5-10 minutes while you run hot water in the shower. General instructions Do not smoke or use any products that contain nicotine or tobacco. If you need help quitting, ask your doctor. Get plenty of rest. Drink enough fluid to keep your pee (urine) pale yellow. Wash your hands often for at least 20 seconds with soap and water. If soap and water are not available, use hand sanitizer. Contact a doctor if: You have a fever for more than 2-3 days. You keep having symptoms for more than 2-3 days. Your throat does not get better in 7 days. You have a fever and your symptoms suddenly get worse. Your  child who is 3 months to 3 years old has a temperature of 102.2F (39C) or higher. Get help right away if: You have trouble breathing. You cannot swallow fluids, soft foods, or your spit. You have swelling in your throat or neck that gets worse. You feel like you may vomit (nauseous) and this feeling lasts a long time. You cannot stop vomiting. These symptoms may be an emergency. Get help right away. Call your local emergency services (911 in the [place] Do not wait to see if the symptoms will go away. Do not drive yourself to the hospital. Summary A sore throat is a painful, burning, irritated, or scratchy throat. Many things can cause a sore throat. Take over-the-counter medicines only as told by your doctor. Get plenty of rest. Drink enough fluid to keep your pee (urine) pale yellow. Contact a doctor if your symptoms get worse or your sore throat does not get better within 7 days. This information is not intended to replace advice given to you by your health care provider. Make sure you discuss any questions you have with your health care provider. Document Revised: 04/03/2020 Document Reviewed: 04/03/2020 Elsevier Patient Education  2023 Elsevier Inc.  

## 2021-06-02 ENCOUNTER — Encounter: Payer: Self-pay | Admitting: Internal Medicine

## 2021-06-02 MED ORDER — ROSUVASTATIN CALCIUM 5 MG PO TABS: 5.0000 mg | ORAL_TABLET | Freq: Every day | ORAL | 1 refills | Status: DC

## 2021-06-23 ENCOUNTER — Telehealth: Payer: Self-pay

## 2021-06-23 ENCOUNTER — Encounter: Payer: Self-pay | Admitting: Internal Medicine

## 2021-06-23 ENCOUNTER — Other Ambulatory Visit: Payer: Self-pay

## 2021-06-23 ENCOUNTER — Ambulatory Visit (INDEPENDENT_AMBULATORY_CARE_PROVIDER_SITE_OTHER): Payer: Managed Care, Other (non HMO) | Admitting: Internal Medicine

## 2021-06-23 VITALS — BP 108/64 | HR 79 | Temp 97.1°F | Ht 71.0 in | Wt 234.0 lb

## 2021-06-23 DIAGNOSIS — Z0001 Encounter for general adult medical examination with abnormal findings: Secondary | ICD-10-CM | POA: Diagnosis not present

## 2021-06-23 DIAGNOSIS — Z1211 Encounter for screening for malignant neoplasm of colon: Secondary | ICD-10-CM

## 2021-06-23 DIAGNOSIS — Z8371 Family history of colonic polyps: Secondary | ICD-10-CM

## 2021-06-23 DIAGNOSIS — M25512 Pain in left shoulder: Secondary | ICD-10-CM

## 2021-06-23 DIAGNOSIS — E119 Type 2 diabetes mellitus without complications: Secondary | ICD-10-CM

## 2021-06-23 DIAGNOSIS — E6609 Other obesity due to excess calories: Secondary | ICD-10-CM | POA: Diagnosis not present

## 2021-06-23 DIAGNOSIS — M25511 Pain in right shoulder: Secondary | ICD-10-CM

## 2021-06-23 DIAGNOSIS — G8929 Other chronic pain: Secondary | ICD-10-CM

## 2021-06-23 DIAGNOSIS — Z6832 Body mass index (BMI) 32.0-32.9, adult: Secondary | ICD-10-CM

## 2021-06-23 LAB — POCT GLYCOSYLATED HEMOGLOBIN (HGB A1C): Hemoglobin A1C: 7.3 % — AB (ref 4.0–5.6)

## 2021-06-23 MED ORDER — NA SULFATE-K SULFATE-MG SULF 17.5-3.13-1.6 GM/177ML PO SOLN: 1.0000 | Freq: Once | ORAL | 0 refills | Status: AC

## 2021-06-23 NOTE — Assessment & Plan Note (Signed)
Encourage diet and exercise for weight loss 

## 2021-06-23 NOTE — Patient Instructions (Signed)
Health Maintenance, Male Adopting a healthy lifestyle and getting preventive care are important in promoting health and wellness. Ask your health care provider about: The right schedule for you to have regular tests and exams. Things you can do on your own to prevent diseases and keep yourself healthy. What should I know about diet, weight, and exercise? Eat a healthy diet  Eat a diet that includes plenty of vegetables, fruits, low-fat dairy products, and lean protein. Do not eat a lot of foods that are high in solid fats, added sugars, or sodium. Maintain a healthy weight Body mass index (BMI) is a measurement that can be used to identify possible weight problems. It estimates body fat based on height and weight. Your health care provider can help determine your BMI and help you achieve or maintain a healthy weight. Get regular exercise Get regular exercise. This is one of the most important things you can do for your health. Most adults should: Exercise for at least 150 minutes each week. The exercise should increase your heart rate and make you sweat (moderate-intensity exercise). Do strengthening exercises at least twice a week. This is in addition to the moderate-intensity exercise. Spend less time sitting. Even light physical activity can be beneficial. Watch cholesterol and blood lipids Have your blood tested for lipids and cholesterol at 48 years of age, then have this test every 5 years. You may need to have your cholesterol levels checked more often if: Your lipid or cholesterol levels are high. You are older than 48 years of age. You are at high risk for heart disease. What should I know about cancer screening? Many types of cancers can be detected early and may often be prevented. Depending on your health history and family history, you may need to have cancer screening at various ages. This may include screening for: Colorectal cancer. Prostate cancer. Skin cancer. Lung  cancer. What should I know about heart disease, diabetes, and high blood pressure? Blood pressure and heart disease High blood pressure causes heart disease and increases the risk of stroke. This is more likely to develop in people who have high blood pressure readings or are overweight. Talk with your health care provider about your target blood pressure readings. Have your blood pressure checked: Every 3-5 years if you are 18-39 years of age. Every year if you are 40 years old or older. If you are between the ages of 65 and 75 and are a current or former smoker, ask your health care provider if you should have a one-time screening for abdominal aortic aneurysm (AAA). Diabetes Have regular diabetes screenings. This checks your fasting blood sugar level. Have the screening done: Once every three years after age 45 if you are at a normal weight and have a low risk for diabetes. More often and at a younger age if you are overweight or have a high risk for diabetes. What should I know about preventing infection? Hepatitis B If you have a higher risk for hepatitis B, you should be screened for this virus. Talk with your health care provider to find out if you are at risk for hepatitis B infection. Hepatitis C Blood testing is recommended for: Everyone born from 1945 through 1965. Anyone with known risk factors for hepatitis C. Sexually transmitted infections (STIs) You should be screened each year for STIs, including gonorrhea and chlamydia, if: You are sexually active and are younger than 48 years of age. You are older than 48 years of age and your   health care provider tells you that you are at risk for this type of infection. Your sexual activity has changed since you were last screened, and you are at increased risk for chlamydia or gonorrhea. Ask your health care provider if you are at risk. Ask your health care provider about whether you are at high risk for HIV. Your health care provider  may recommend a prescription medicine to help prevent HIV infection. If you choose to take medicine to prevent HIV, you should first get tested for HIV. You should then be tested every 3 months for as long as you are taking the medicine. Follow these instructions at home: Alcohol use Do not drink alcohol if your health care provider tells you not to drink. If you drink alcohol: Limit how much you have to 0-2 drinks a day. Know how much alcohol is in your drink. In the [place] one drink equals one 12 oz bottle of beer (355 mL), one 5 oz glass of wine (148 mL), or one 1 oz glass of hard liquor (44 mL). Lifestyle Do not use any products that contain nicotine or tobacco. These products include cigarettes, chewing tobacco, and vaping devices, such as e-cigarettes. If you need help quitting, ask your health care provider. Do not use street drugs. Do not share needles. Ask your health care provider for help if you need support or information about quitting drugs. General instructions Schedule regular health, dental, and eye exams. Stay current with your vaccines. Tell your health care provider if: You often feel depressed. You have ever been abused or do not feel safe at home. Summary Adopting a healthy lifestyle and getting preventive care are important in promoting health and wellness. Follow your health care provider's instructions about healthy diet, exercising, and getting tested or screened for diseases. Follow your health care provider's instructions on monitoring your cholesterol and blood pressure. This information is not intended to replace advice given to you by your health care provider. Make sure you discuss any questions you have with your health care provider. Document Revised: 05/27/2020 Document Reviewed: 05/27/2020 Elsevier Patient Education  2023 Elsevier Inc.  

## 2021-06-23 NOTE — Telephone Encounter (Signed)
Gastroenterology Pre-Procedure Review  Request Date: 08/01/21 Requesting Physician: Dr. Marius Ditch  PATIENT REVIEW QUESTIONS: The patient responded to the following health history questions as indicated:    1. Are you having any GI issues? no 2. Do you have a personal history of Polyps? no 3. Do you have a family history of Colon Cancer or Polyps? yes (grandfather and father colon polyps) 4. Diabetes Mellitus? yes (type 2) 5. Joint replacements in the past 12 months?no 6. Major health problems in the past 3 months?no 7. Any artificial heart valves, MVP, or defibrillator?no    MEDICATIONS & ALLERGIES:    Patient reports the following regarding taking any anticoagulation/antiplatelet therapy:   Plavix, Coumadin, Eliquis, Xarelto, Lovenox, Pradaxa, Brilinta, or Effient? no Aspirin? no  Patient confirms/reports the following medications:  Current Outpatient Medications  Medication Sig Dispense Refill   Blood Glucose Monitoring Suppl (ONETOUCH VERIO) w/Device KIT 1 Device by Does not apply route daily. 1 kit 0   fenofibrate (TRICOR) 145 MG tablet Take 1 tablet (145 mg total) by mouth daily. 90 tablet 1   glipiZIDE (GLUCOTROL XL) 10 MG 24 hr tablet Take 1 tablet (10 mg total) by mouth daily with breakfast. 90 tablet 1   glucose blood (ONETOUCH VERIO) test strip 1 each by Other route in the morning and at bedtime. Use as instructed 100 strip 12   JARDIANCE 25 MG TABS tablet TAKE 1 TABLET BY MOUTH EVERY DAY BEFORE BREAKFAST 90 tablet 0   Lancets (ONETOUCH ULTRASOFT) lancets Use as instructed 100 each 12   lisinopril (ZESTRIL) 10 MG tablet Take 1 tablet (10 mg total) by mouth daily. 90 tablet 1   rosuvastatin (CRESTOR) 5 MG tablet Take 1 tablet (5 mg total) by mouth daily. 90 tablet 1   No current facility-administered medications for this visit.    Patient confirms/reports the following allergies:  Allergies  Allergen Reactions   Metformin And Related Diarrhea    No orders of the defined  types were placed in this encounter.   AUTHORIZATION INFORMATION Primary Insurance: 1D#: Group #:  Secondary Insurance: 1D#: Group #:  SCHEDULE INFORMATION: Date: 08/01/21 Time: Location: [facility]

## 2021-06-23 NOTE — Assessment & Plan Note (Signed)
He declines x-ray at this time Discussed starting daily anti-inflammatory such as Meloxicam but he would like to hold off We will monitor for now

## 2021-06-23 NOTE — Progress Notes (Signed)
Subjective:    Patient ID: Jerry Reid, male    DOB: February 23, 1975, 46 y.o.   MRN: [id number]  HPI  Patient presents to clinic today for his annual exam.  Flu: never Tetanus: 08/2019 COVID: [place] x2 Pneumovax: 11/2016 Colon screening: never Vision screening: annually Dentist: biannually  Diet: He does eat meat. He consumes fruits and veggies. He does eat some fried foods. He drinks mostly Dt. Soda or water. Exercise: None  Review of Systems     Past Medical History:  Diagnosis Date   Chest pain    Chicken pox    Cholelithiasis    Diabetes mellitus without complication (HCC)    Dysplastic nevus    sees Dr. Evorn Gong PAC [place]    HTN (hypertension)    Hyperlipidemia    Kidney stones     Current Outpatient Medications  Medication Sig Dispense Refill   Blood Glucose Monitoring Suppl (ONETOUCH VERIO) w/Device KIT 1 Device by Does not apply route daily. 1 kit 0   chlorpheniramine-HYDROcodone (TUSSIONEX PENNKINETIC ER) 10-8 MG/5ML Take 5 mLs by mouth every 12 (twelve) hours as needed for cough. 115 mL 0   fenofibrate (TRICOR) 145 MG tablet Take 1 tablet (145 mg total) by mouth daily. 90 tablet 1   glipiZIDE (GLUCOTROL XL) 10 MG 24 hr tablet Take 1 tablet (10 mg total) by mouth daily with breakfast. 90 tablet 1   glucose blood (ONETOUCH VERIO) test strip 1 each by Other route in the morning and at bedtime. Use as instructed 100 strip 12   JARDIANCE 25 MG TABS tablet TAKE 1 TABLET BY MOUTH EVERY DAY BEFORE BREAKFAST 90 tablet 0   Lancets (ONETOUCH ULTRASOFT) lancets Use as instructed 100 each 12   lisinopril (ZESTRIL) 10 MG tablet Take 1 tablet (10 mg total) by mouth daily. 90 tablet 1   predniSONE (DELTASONE) 10 MG tablet Take 6 tabs on day 1, 5 tabs on day 2, 4 tabs on day 3, 3 tabs on day 4, 2 tabs on day 5, 1 tab on day 6 21 tablet 0   rosuvastatin (CRESTOR) 5 MG tablet Take 1 tablet (5 mg total) by mouth daily. 90 tablet 1   No current facility-administered medications  for this visit.    Allergies  Allergen Reactions   Metformin And Related Diarrhea    Family History  Problem Relation Age of Onset   Heart attack Mother    Heart disease Mother    Hyperlipidemia Mother    Hypertension Mother    Diabetes Mother    Hypertension Father    Heart disease Father    Hyperlipidemia Father    Diabetes Father     Social History   Socioeconomic History   Marital status: Married    Spouse name: Not on file   Number of children: Not on file   Years of education: Not on file   Highest education level: Not on file  Occupational History   Not on file  Tobacco Use   Smoking status: Never   Smokeless tobacco: Never  Vaping Use   Vaping Use: Never used  Substance and Sexual Activity   Alcohol use: No   Drug use: No   Sexual activity: Not on file  Other Topics Concern   Not on file  Social History Narrative   Works for the county    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  HEENT: Denies eye pain, eye redness, ear pain, ringing in the ears, wax buildup, runny nose, nasal congestion, bloody nose, or sore throat. Respiratory: Denies difficulty breathing, shortness of breath, cough or sputum production.   Cardiovascular: Denies chest pain, chest tightness, palpitations or swelling in the hands or feet.  Gastrointestinal: Denies abdominal pain, bloating, constipation, diarrhea or blood in the stool.  GU: Denies urgency, frequency, pain with urination, burning sensation, blood in urine, odor or discharge. Musculoskeletal: Pt reports bilateral  shoulder pain. Denies decrease in range of motion, difficulty  with gait, muscle pain or joint swelling.  Skin: Denies redness, rashes, lesions or ulcercations.  Neurological: Denies dizziness, difficulty with memory, difficulty with speech or problems with balance and coordination.  Psych: Denies anxiety, depression, SI/HI.  No other specific complaints in a complete review of systems (except as listed in HPI above).  Objective:   Physical Exam  BP 108/64 (BP Location: Left Arm, Patient Position: Sitting, Cuff Size: Large)   Pulse 79   Temp (!) 97.1 F (36.2 C) (Temporal)   Ht 5' 11"  (1.803 m)   Wt 234 lb (106.1 kg)   SpO2 98%   BMI 32.64 kg/m   Wt Readings from Last 3 Encounters:  01/09/21 233 lb 9.6 oz (106 kg)  10/03/20 234 lb 12.8 oz (106.5 kg)  06/27/20 242 lb 9.6 oz (110 kg)    General: Appears his stated age, obese, in NAD. Skin: Warm, dry and intact. No ulcerations noted. HEENT: Head: normal shape and size; Eyes: sclera white, no icterus, conjunctiva pink, PERRLA and EOMs intact;  Neck:  Neck supple, trachea midline. No masses, lumps or thyromegaly present.  Cardiovascular: Normal rate and rhythm. S1,S2 noted.  No murmur, rubs or gallops noted. No JVD or BLE edema. Pulmonary/Chest: Normal effort and positive vesicular breath sounds. No respiratory distress. No wheezes, rales or ronchi noted.  Abdomen: Soft and nontender. Normal bowel sounds.  Musculoskeletal: Normal internal, external rotation of bilateral shoulders.  No pain with palpation of the shoulders.  Negative drop can test.  Strength 5/5 BUE/BLE.  No difficulty with gait.  Neurological: Alert and oriented. Cranial nerves II-XII grossly intact. Coordination normal.  Psychiatric: Mood and affect normal. Behavior is normal. Judgment and thought content normal.    BMET    Component Value Date/Time   NA 135 06/28/2020 0756   NA 139 07/19/2019 1005   K 4.0 06/28/2020 0756   CL 103 06/28/2020 0756   CO2 23 06/28/2020 0756   GLUCOSE 247 (H) 06/28/2020 0756   BUN 11  06/28/2020 0756   BUN 15 07/19/2019 1005   CREATININE 0.94 06/28/2020 0756   CALCIUM 9.3 06/28/2020 0756   GFRNONAA 96 06/28/2020 0756   GFRAA 111 06/28/2020 0756    Lipid Panel     Component Value Date/Time   CHOL 161 01/09/2021 0816   CHOL 150 06/18/2020 1259   TRIG 416 (H) 01/09/2021 0816   HDL 31 (L) 01/09/2021 0816   HDL 29 (L) 06/18/2020 1259   CHOLHDL 5.2 (H) 01/09/2021 0816   LDLCALC  01/09/2021 0816     Comment:     . LDL cholesterol not calculated. Triglyceride levels greater than 400 mg/dL invalidate calculated LDL results. Marland Kitchen  Reference range: <100 . Desirable range <100 mg/dL for primary prevention;   <70 mg/dL for patients with CHD or diabetic patients  with > or = 2 CHD risk factors. Marland Kitchen LDL-C is now calculated using the Martin-Hopkins  calculation, which is a validated novel method providing  better accuracy than the Friedewald equation in the  estimation of LDL-C.  Cresenciano Genre et al. Annamaria Helling. 6568;127(51): 2061-2068  (http://education.[facility].com/faq/FAQ164)     CBC    Component Value Date/Time   WBC 4.5 03/23/2018 0935   WBC 8.3 09/26/2015 0203   RBC 5.65 03/23/2018 0935   RBC 5.35 09/26/2015 0203   HGB 16.9 03/23/2018 0935   HCT 47.4 03/23/2018 0935   PLT 231 03/23/2018 0935   MCV 84 03/23/2018 0935   MCH 29.9 03/23/2018 0935   MCH 30.4 09/26/2015 0203   MCHC 35.7 03/23/2018 0935   MCHC 36.3 (H) 09/26/2015 0203   RDW 13.7 03/23/2018 0935   LYMPHSABS 1.3 03/23/2018 0935   EOSABS 0.0 03/23/2018 0935   BASOSABS 0.0 03/23/2018 0935    Hgb A1C Lab Results  Component Value Date   HGBA1C 6.7 (H) 01/09/2021           Assessment & Plan:   Preventative Health Maintenance:  Encouraged him to get a flu shot in the fall Tetanus UTD Encouraged him to get his COVID booster Pneumovax due 11/2021 Referral to GI for screening colonoscopy Encouraged him to consume a balanced diet and exercise regimen Advised him to see an eye doctor and  dentist annually POCT A1c 7.3% He would like to have the rest of his labs, CBC, c-Met, lipid, A1c, urine microalbumin and hep C, checked by the county doctor next week  RTC in 6 months, follow-up chronic conditions Webb Silversmith, NP

## 2021-06-23 NOTE — Assessment & Plan Note (Signed)
POCT A1c 7.3% He will have urine microalbumin checked by the [place] doctor Encouraged him to consume a low-carb diet and exercise weight loss Continue Glipizide and Jardiance We will request copy of eye exam Encourage routine foot exam

## 2021-06-30 ENCOUNTER — Encounter: Payer: Self-pay | Admitting: Internal Medicine

## 2021-07-07 ENCOUNTER — Other Ambulatory Visit: Payer: Self-pay | Admitting: Internal Medicine

## 2021-07-07 NOTE — Telephone Encounter (Signed)
Requested Prescriptions  Pending Prescriptions Disp Refills  . ONETOUCH VERIO test strip [Pharmacy Med Name: ONE TOUCH VERIO TEST STRIP] 100 strip 12    Sig: 1 EACH BY OTHER ROUTE IN THE MORNING AND AT BEDTIME. USE AS INSTRUCTED     Endocrinology: Diabetes - Testing Supplies Passed - 07/07/2021  8:15 AM      Passed - Valid encounter within last 12 months    Recent Outpatient Visits          2 weeks ago Encounter for general adult medical examination with abnormal findings   [facility] [place], Coralie Keens, NP   1 month ago Sore throat   [facility] [place], [place], NP   5 months ago Type 2 diabetes mellitus without complication, without long-term current use of insulin [facility])   [facility], Coralie Keens, NP   9 months ago Type 2 diabetes mellitus with hyperglycemia, without long-term current use of insulin [facility])   [facility], Coralie Keens, NP   1 year ago Type 2 diabetes mellitus with hyperglycemia, without long-term current use of insulin [facility])   [facility], Coralie Keens, NP      Future Appointments            In 5 months Baity, Coralie Keens, NP [facility], [facility]

## 2021-07-08 ENCOUNTER — Encounter: Payer: Self-pay | Admitting: Internal Medicine

## 2021-07-08 DIAGNOSIS — M25511 Pain in right shoulder: Secondary | ICD-10-CM

## 2021-07-09 ENCOUNTER — Ambulatory Visit
Admission: RE | Admit: 2021-07-09 | Discharge: 2021-07-09 | Disposition: A | Discharge status: Home / Self Care | Payer: Managed Care, Other (non HMO) | Attending: Internal Medicine | Admitting: Internal Medicine

## 2021-07-09 ENCOUNTER — Ambulatory Visit
Admission: RE | Admit: 2021-07-09 | Discharge: 2021-07-09 | Disposition: A | Discharge status: Home / Self Care | Payer: Managed Care, Other (non HMO) | Source: Ambulatory Visit | Attending: Internal Medicine | Admitting: Internal Medicine

## 2021-07-09 DIAGNOSIS — M25512 Pain in left shoulder: Secondary | ICD-10-CM | POA: Insufficient documentation

## 2021-07-09 DIAGNOSIS — M25511 Pain in right shoulder: Secondary | ICD-10-CM | POA: Diagnosis present

## 2021-07-21 ENCOUNTER — Other Ambulatory Visit: Payer: Self-pay | Admitting: Internal Medicine

## 2021-07-21 MED ORDER — EMPAGLIFLOZIN 25 MG PO TABS: 25.0000 mg | ORAL_TABLET | Freq: Every day | ORAL | 1 refills | Status: DC

## 2021-07-28 ENCOUNTER — Other Ambulatory Visit: Payer: Self-pay | Admitting: Internal Medicine

## 2021-07-29 NOTE — Telephone Encounter (Signed)
Requested Prescriptions  Pending Prescriptions Disp Refills  . fenofibrate (TRICOR) 145 MG tablet [Pharmacy Med Name: FENOFIBRATE 145 MG TABLET] 90 tablet 1    Sig: TAKE 1 TABLET BY MOUTH EVERY DAY     Cardiovascular:  Antilipid - Fibric Acid Derivatives Failed - 07/28/2021  2:13 AM      Failed - ALT in normal range and within 360 days    ALT  Date Value Ref Range Status  06/28/2020 32 9 - 46 U/L Final         Failed - AST in normal range and within 360 days    AST  Date Value Ref Range Status  06/28/2020 21 10 - 40 U/L Final         Failed - Cr in normal range and within 360 days    Creat  Date Value Ref Range Status  06/28/2020 0.94 0.60 - 1.35 mg/dL Final   Creatinine, Urine  Date Value Ref Range Status  01/09/2021 61 20 - 320 mg/dL Final         Failed - HGB in normal range and within 360 days    Hemoglobin  Date Value Ref Range Status  03/23/2018 16.9 13.0 - 17.7 g/dL Final         Failed - HCT in normal range and within 360 days    Hematocrit  Date Value Ref Range Status  03/23/2018 47.4 37.5 - 51.0 % Final         Failed - PLT in normal range and within 360 days    Platelets  Date Value Ref Range Status  03/23/2018 231 150 - 450 x10E3/uL Final         Failed - WBC in normal range and within 360 days    WBC  Date Value Ref Range Status  03/23/2018 4.5 3.4 - 10.8 x10E3/uL Final  09/26/2015 8.3 3.8 - 10.6 K/uL Final         Failed - eGFR is 30 or above and within 360 days    GFR, Est African American  Date Value Ref Range Status  06/28/2020 111 > OR = 60 mL/min/1.101m Final   GFR, Est Non African American  Date Value Ref Range Status  06/28/2020 96 > OR = 60 mL/min/1.739mFinal   GFR  Date Value Ref Range Status  11/17/2018 80.55 >60.00 mL/min Final         Failed - Lipid Panel in normal range within the last 12 months    Cholesterol, Total  Date Value Ref Range Status  06/18/2020 150 100 - 199 mg/dL Final   Cholesterol  Date Value Ref  Range Status  01/09/2021 161 <200 mg/dL Final   LDL Cholesterol (Calc)  Date Value Ref Range Status  01/09/2021  mg/dL (calc) Final    Comment:    . LDL cholesterol not calculated. Triglyceride levels greater than 400 mg/dL invalidate calculated LDL results. . Reference range: <100 . Desirable range <100 mg/dL for primary prevention;   <70 mg/dL for patients with CHD or diabetic patients  with > or = 2 CHD risk factors. . Marland KitchenDL-C is now calculated using the Martin-Hopkins  calculation, which is a validated novel method providing  better accuracy than the Friedewald equation in the  estimation of LDL-C.  MaCresenciano Genret al. JAAnnamaria Helling208546;270(35 2061-2068  (http://education.[facility].com/faq/FAQ164)    Direct LDL  Date Value Ref Range Status  01/04/2020 119.0 mg/dL Final    Comment:    Optimal:  <100 mg/dLNear or Above  Optimal:  100-129 mg/dLBorderline High:  130-159 mg/dLHigh:  160-189 mg/dLVery High:  >190 mg/dL   HDL  Date Value Ref Range Status  01/09/2021 31 (L) > OR = 40 mg/dL Final  06/18/2020 29 (L) >39 mg/dL Final   Triglycerides  Date Value Ref Range Status  01/09/2021 416 (H) <150 mg/dL Final    Comment:    . If a non-fasting specimen was collected, consider repeat triglyceride testing on a fasting specimen if clinically indicated.  Yates Decamp et al. J. of Clin. Lipidol. 0981;1:914-782. Marland Kitchen          Passed - Valid encounter within last 12 months    Recent Outpatient Visits          1 month ago Encounter for general adult medical examination with abnormal findings   [facility] [place], Coralie Keens, NP   2 months ago Sore throat   [facility] [place], [place] W, NP   6 months ago Type 2 diabetes mellitus without complication, without long-term current use of insulin [facility])   [facility], Coralie Keens, NP   9 months ago Type 2 diabetes mellitus with hyperglycemia, without long-term current use of insulin [facility])    [facility] [place], Coralie Keens, NP   1 year ago Type 2 diabetes mellitus with hyperglycemia, without long-term current use of insulin [facility])   [facility], Coralie Keens, NP      Future Appointments            In 4 months Baity, Coralie Keens, NP [facility], [place]           . glipiZIDE (GLUCOTROL XL) 10 MG 24 hr tablet [Pharmacy Med Name: GLIPIZIDE ER 10 MG TABLET] 90 tablet 1    Sig: TAKE 1 TABLET (10 MG TOTAL) BY MOUTH DAILY WITH BREAKFAST.     Endocrinology:  Diabetes - Sulfonylureas Failed - 07/28/2021  2:13 AM      Failed - Cr in normal range and within 360 days    Creat  Date Value Ref Range Status  06/28/2020 0.94 0.60 - 1.35 mg/dL Final   Creatinine, Urine  Date Value Ref Range Status  01/09/2021 61 20 - 320 mg/dL Final         Passed - HBA1C is between 0 and 7.9 and within 180 days    Hemoglobin A1C  Date Value Ref Range Status  06/23/2021 7.3 (A) 4.0 - 5.6 % Final   Hgb A1c MFr Bld  Date Value Ref Range Status  01/09/2021 6.7 (H) <5.7 % of total Hgb Final    Comment:    For someone without known diabetes, a hemoglobin A1c value of 6.5% or greater indicates that they may have  diabetes and this should be confirmed with a follow-up  test. . For someone with known diabetes, a value <7% indicates  that their diabetes is well controlled and a value  greater than or equal to 7% indicates suboptimal  control. A1c targets should be individualized based on  duration of diabetes, age, comorbid conditions, and  other considerations. . Currently, no consensus exists regarding use of hemoglobin A1c for diagnosis of diabetes for children. Renella Cunas - Valid encounter within last 6 months    Recent Outpatient Visits          1 month ago Encounter for general adult medical examination with abnormal findings  [facility] [place], Coralie Keens, NP   2 months ago Sore throat   [facility], [place], NP   6 months ago Type 2 diabetes mellitus without complication, without long-term current use of insulin [facility])   [facility], Coralie Keens, NP   9 months ago Type 2 diabetes mellitus with hyperglycemia, without long-term current use of insulin [facility])   [facility] [place], Coralie Keens, NP   1 year ago Type 2 diabetes mellitus with hyperglycemia, without long-term current use of insulin [facility])   [facility], Coralie Keens, NP      Future Appointments            In 4 months Baity, Coralie Keens, NP [facility], [facility]

## 2021-08-01 ENCOUNTER — Other Ambulatory Visit: Payer: Self-pay

## 2021-08-01 ENCOUNTER — Encounter
Admission: RE | Disposition: A | Discharge status: Home / Self Care | Payer: Self-pay | Source: Home / Self Care | Attending: Gastroenterology

## 2021-08-01 ENCOUNTER — Ambulatory Visit
Admission: RE | Admit: 2021-08-01 | Discharge: 2021-08-01 | Disposition: A | Discharge status: Home / Self Care | Payer: Managed Care, Other (non HMO) | Attending: Gastroenterology | Admitting: Gastroenterology

## 2021-08-01 ENCOUNTER — Ambulatory Visit: Payer: Managed Care, Other (non HMO) | Admitting: Anesthesiology

## 2021-08-01 ENCOUNTER — Encounter: Payer: Self-pay | Admitting: Gastroenterology

## 2021-08-01 DIAGNOSIS — Z8371 Family history of colonic polyps: Secondary | ICD-10-CM

## 2021-08-01 DIAGNOSIS — D125 Benign neoplasm of sigmoid colon: Secondary | ICD-10-CM | POA: Diagnosis not present

## 2021-08-01 DIAGNOSIS — D122 Benign neoplasm of ascending colon: Secondary | ICD-10-CM | POA: Insufficient documentation

## 2021-08-01 DIAGNOSIS — Z1211 Encounter for screening for malignant neoplasm of colon: Secondary | ICD-10-CM | POA: Insufficient documentation

## 2021-08-01 DIAGNOSIS — K635 Polyp of colon: Secondary | ICD-10-CM

## 2021-08-01 DIAGNOSIS — Z7984 Long term (current) use of oral hypoglycemic drugs: Secondary | ICD-10-CM | POA: Diagnosis not present

## 2021-08-01 DIAGNOSIS — K5730 Diverticulosis of large intestine without perforation or abscess without bleeding: Secondary | ICD-10-CM | POA: Insufficient documentation

## 2021-08-01 DIAGNOSIS — E1165 Type 2 diabetes mellitus with hyperglycemia: Secondary | ICD-10-CM

## 2021-08-01 DIAGNOSIS — I10 Essential (primary) hypertension: Secondary | ICD-10-CM | POA: Insufficient documentation

## 2021-08-01 DIAGNOSIS — E119 Type 2 diabetes mellitus without complications: Secondary | ICD-10-CM | POA: Diagnosis not present

## 2021-08-01 HISTORY — PX: COLONOSCOPY WITH PROPOFOL: SHX5780

## 2021-08-01 LAB — GLUCOSE, CAPILLARY: Glucose-Capillary: 133 mg/dL — ABNORMAL HIGH (ref 70–99)

## 2021-08-01 SURGERY — COLONOSCOPY WITH PROPOFOL
Anesthesia: General

## 2021-08-01 MED ORDER — SODIUM CHLORIDE 0.9 % IV SOLN: INTRAVENOUS | Status: DC

## 2021-08-01 MED ORDER — PROPOFOL 500 MG/50ML IV EMUL
INTRAVENOUS | Status: DC | PRN
  Administered 2021-08-01: 140 ug/kg/min via INTRAVENOUS

## 2021-08-01 MED ORDER — PROPOFOL 10 MG/ML IV BOLUS
INTRAVENOUS | Status: DC | PRN
  Administered 2021-08-01: 70 mg via INTRAVENOUS

## 2021-08-01 MED ORDER — LIDOCAINE HCL (CARDIAC) PF 100 MG/5ML IV SOSY
PREFILLED_SYRINGE | INTRAVENOUS | Status: DC | PRN
  Administered 2021-08-01: 60 mg via INTRAVENOUS

## 2021-08-01 NOTE — H&P (Signed)
Jerry R Vanga, MD [address]  [address]  [place], [place] [address]  Main: [phone]  Fax: [phone] Pager: [phone]  Primary Care Physician:  Reid, Jerry W, NP Primary Gastroenterologist:  Dr. Rohini R Reid  Pre-Procedure History & Physical: HPI:  Jerry Reid is a 46 y.o. male is here for an colonoscopy.   Past Medical History:  Diagnosis Date   Chest pain    Chicken pox    Cholelithiasis    Diabetes mellitus without complication (HCC)    Dysplastic nevus    sees Dr. Dasher/ PAC [place]    HTN (hypertension)    Hyperlipidemia    Kidney stones     Past Surgical History:  Procedure Laterality Date   NO PAST SURGERIES      Prior to Admission medications   Medication Sig Start Date End Date Taking? Authorizing Provider  empagliflozin (JARDIANCE) 25 MG TABS tablet Take 1 tablet (25 mg total) by mouth daily. 07/21/21  Yes Reid, Jerry W, NP  fenofibrate (TRICOR) 145 MG tablet TAKE 1 TABLET BY MOUTH EVERY DAY 07/29/21  Yes Reid, Jerry W, NP  glipiZIDE (GLUCOTROL XL) 10 MG 24 hr tablet TAKE 1 TABLET (10 MG TOTAL) BY MOUTH DAILY WITH BREAKFAST. 07/29/21  Yes Reid, Jerry W, NP  lisinopril (ZESTRIL) 10 MG tablet Take 1 tablet (10 mg total) by mouth daily. 03/24/21  Yes Reid, Jerry W, NP  rosuvastatin (CRESTOR) 5 MG tablet Take 1 tablet (5 mg total) by mouth daily. 06/02/21  Yes Reid, Jerry W, NP  Blood Glucose Monitoring Suppl (ONETOUCH VERIO) w/Device KIT 1 Device by Does not apply route daily. 07/02/20   Reid, Jerry W, NP  Lancets (ONETOUCH ULTRASOFT) lancets Use as instructed 07/02/20   Reid, Jerry W, NP  ONETOUCH VERIO test strip 1 EACH BY OTHER ROUTE IN THE MORNING AND AT BEDTIME. USE AS INSTRUCTED 07/07/21   Reid, Jerry W, NP    Allergies as of 06/23/2021 - Review Complete 06/23/2021  Allergen Reaction Noted   Metformin and related Diarrhea 03/12/2016    Family History  Problem Relation Age of Onset   Heart attack Mother    Heart disease  Mother    Hyperlipidemia Mother    Hypertension Mother    Diabetes Mother    Hypertension Father    Heart disease Father    Hyperlipidemia Father    Diabetes Father     Social History   Socioeconomic History   Marital status: Married    Spouse name: Not on file   Number of children: Not on file   Years of education: Not on file   Highest education level: Not on file  Occupational History   Not on file  Tobacco Use   Smoking status: Never   Smokeless tobacco: Never  Vaping Use   Vaping Use: Never used  Substance and Sexual Activity   Alcohol use: No   Drug use: No   Sexual activity: Not on file  Other Topics Concern   Not on file  Social History Narrative   Works for the county    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 Neck:  Supple; no masses or thyromegaly. Lungs:  Clear throughout to auscultation.    Heart:  Regular rate and rhythm. Abdomen:  Soft, nontender and nondistended. Normal bowel sounds, without guarding, and without rebound.   Neurologic:  Alert and  oriented x4;  grossly normal neurologically.  Impression/Plan: Jerry Reid is here for an colonoscopy to be performed for colon cancer screening  Risks, benefits, limitations, and alternatives regarding  colonoscopy have been reviewed with the patient.  Questions have been answered.  All parties agreeable.   Sherri Sear, MD  08/01/2021, 11:01  AM

## 2021-08-01 NOTE — Anesthesia Preprocedure Evaluation (Addendum)
Anesthesia Evaluation  Patient identified by MRN, date of birth, ID band Patient awake    Reviewed: Allergy & Precautions, NPO status , Patient's Chart, lab work & pertinent test results  History of Anesthesia Complications Negative for: history of anesthetic complications  Airway Mallampati: IV   Neck ROM: Full    Dental no notable dental hx.    Pulmonary neg pulmonary ROS,    Pulmonary exam normal breath sounds clear to auscultation       Cardiovascular hypertension, Normal cardiovascular exam Rhythm:Regular Rate:Normal     Neuro/Psych negative neurological ROS     GI/Hepatic negative GI ROS,   Endo/Other  diabetes, Type 2  Renal/GU Renal disease (nephrolithiasis)     Musculoskeletal   Abdominal   Peds  Hematology negative hematology ROS (+)   Anesthesia Other Findings   Reproductive/Obstetrics                            Anesthesia Physical Anesthesia Plan  ASA: 2  Anesthesia Plan: General   Post-op Pain Management:    Induction: Intravenous  PONV Risk Score and Plan: 2 and Propofol infusion, TIVA and Treatment may vary due to age or medical condition  Airway Management Planned: Natural Airway  Additional Equipment:   Intra-op Plan:   Post-operative Plan:   Informed Consent: I have reviewed the patients History and Physical, chart, labs and discussed the procedure including the risks, benefits and alternatives for the proposed anesthesia with the patient or authorized representative who has indicated his/her understanding and acceptance.       Plan Discussed with: CRNA  Anesthesia Plan Comments: (LMA/GETA backup discussed.  Patient consented for risks of anesthesia including but not limited to:  - adverse reactions to medications - damage to eyes, teeth, lips or other oral mucosa - nerve damage due to positioning  - sore throat or hoarseness - damage to heart,  brain, nerves, lungs, other parts of body or loss of life  Informed patient about role of CRNA in peri- and intra-operative care.  Patient voiced understanding.)        Anesthesia Quick Evaluation

## 2021-08-01 NOTE — Anesthesia Postprocedure Evaluation (Signed)
Anesthesia Post Note  Patient: Jerry Reid  Procedure(s) Performed: COLONOSCOPY WITH PROPOFOL  Patient location during evaluation: PACU Anesthesia Type: General Level of consciousness: awake and alert, oriented and patient cooperative Pain management: pain level controlled Vital Signs Assessment: post-procedure vital signs reviewed and stable Respiratory status: spontaneous breathing, nonlabored ventilation and respiratory function stable Cardiovascular status: blood pressure returned to baseline and stable Postop Assessment: adequate PO intake Anesthetic complications: no   No notable events documented.   Last Vitals:  Vitals:   08/01/21 1220 08/01/21 1231  BP: 96/70 112/75  Pulse: 74 (!) 58  Resp: 13 11  Temp:    SpO2: 99% 100%    Last Pain:  Vitals:   08/01/21 1048  TempSrc: Temporal  PainSc: 0-No pain                 Darrin Nipper

## 2021-08-01 NOTE — Op Note (Signed)
[facility] Gastroenterology Patient Name: Legrande Hao Procedure Date: 08/01/2021 11:35 AM MRN: [id number] Account #: [id number] Date of Birth: 1975-12-01 Admit Type: Outpatient Age: 46 Room: [facility] ENDO ROOM 2 Gender: Male Note Status: Finalized Instrument Name: Jasper Riling [id number] Procedure:             Colonoscopy Indications:           Screening for colorectal malignant neoplasm, This is                         the patient's first colonoscopy Providers:             Lin Landsman MD, MD Referring MD:          Jearld Fenton (Referring MD) Medicines:             General Anesthesia Complications:         No immediate complications. Estimated blood loss: None. Procedure:             Pre-Anesthesia Assessment:                        - Prior to the procedure, a History and Physical was                         performed, and patient medications and allergies were                         reviewed. The patient is competent. The risks and                         benefits of the procedure and the sedation options and                         risks were discussed with the patient. All questions                         were answered and informed consent was obtained.                         Patient identification and proposed procedure were                         verified by the physician, the nurse, the                         anesthesiologist, the anesthetist and the technician                         in the pre-procedure area in the procedure room in the                         endoscopy suite. Mental Status Examination: alert and                         oriented. Airway Examination: normal oropharyngeal                         airway and neck mobility. Respiratory Examination:  clear to auscultation. CV Examination: normal.                         Prophylactic Antibiotics: The patient does not require                         prophylactic  antibiotics. Prior Anticoagulants: The                         patient has taken no previous anticoagulant or                         antiplatelet agents. ASA Grade Assessment: II - A                         patient with mild systemic disease. After reviewing                         the risks and benefits, the patient was deemed in                         satisfactory condition to undergo the procedure. The                         anesthesia plan was to use general anesthesia.                         Immediately prior to administration of medications,                         the patient was re-assessed for adequacy to receive                         sedatives. The heart rate, respiratory rate, oxygen                         saturations, blood pressure, adequacy of pulmonary                         ventilation, and response to care were monitored                         throughout the procedure. The physical status of the                         patient was re-assessed after the procedure.                        After obtaining informed consent, the colonoscope was                         passed under direct vision. Throughout the procedure,                         the patient's blood pressure, pulse, and oxygen                         saturations were monitored continuously. The  Colonoscope was introduced through the anus and                         advanced to the the terminal ileum, with                         identification of the appendiceal orifice and IC                         valve. The colonoscopy was performed without                         difficulty. The patient tolerated the procedure well.                         The quality of the bowel preparation was evaluated                         using the BBPS [facility] Bowel Preparation Scale) with                         scores of: Right Colon = 3, Transverse Colon = 3 and                         Left Colon =  3 (entire mucosa seen well with no                         residual staining, small fragments of stool or opaque                         liquid). The total BBPS score equals 9. Findings:      The perianal and digital rectal examinations were normal. Pertinent       negatives include normal sphincter tone and no palpable rectal lesions.      Three sessile polyps were found in the ascending colon. The polyps were       3 to 8 mm in size. These polyps were removed with a cold snare.       Resection and retrieval were complete. Estimated blood loss: none.      A 25 mm polyp was found in the sigmoid colon. The polyp was       pedunculated. The polyp was removed with a hot snare. Resection and       retrieval were complete. Estimated blood loss: none.      The retroflexed view of the distal rectum and anal verge was normal and       showed no anal or rectal abnormalities.      A few small-mouthed diverticula were found in the descending colon. Impression:            - Three 3 to 8 mm polyps in the ascending colon,                         removed with a cold snare. Resected and retrieved.                        - One 25 mm polyp in the sigmoid colon, removed with a  hot snare. Resected and retrieved.                        - The distal rectum and anal verge are normal on                         retroflexion view.                        - Diverticulosis in the descending colon. Recommendation:        - Discharge patient to home (with escort).                        - Resume previous diet today.                        - Continue present medications.                        - Await pathology results.                        - Repeat colonoscopy in 3 years or sooner based on the                         pathology results for surveillance of multiple polyps. Procedure Code(s):     --- Professional ---                        [phone], Colonoscopy, flexible; with removal of                          tumor(s), polyp(s), or other lesion(s) by snare                         technique Diagnosis Code(s):     --- Professional ---                        K63.5, Polyp of colon                        Z12.11, Encounter for screening for malignant neoplasm                         of colon                        K57.30, Diverticulosis of large intestine without                         perforation or abscess without bleeding CPT copyright 2019 American Medical Association. All rights reserved. The codes documented in this report are preliminary and upon coder review may  be revised to meet current compliance requirements. Dr. Ulyess Mort Lin Landsman MD, MD 08/01/2021 12:07:18 PM This report has been signed electronically. Number of Addenda: 0 Note Initiated On: 08/01/2021 11:35 AM Scope Withdrawal Time: 0 hours 14 minutes 55 seconds  Total Procedure Duration: 0 hours 16 minutes 28 seconds  Estimated Blood Loss:  Estimated blood loss: none.      [facility]

## 2021-08-01 NOTE — Transfer of Care (Signed)
Immediate Anesthesia Transfer of Care Note  Patient: Jerry Reid  Procedure(s) Performed: COLONOSCOPY WITH PROPOFOL  Patient Location: PACU  Anesthesia Type:General  Level of Consciousness: awake, alert  and oriented  Airway & Oxygen Therapy: Patient Spontanous Breathing  Post-op Assessment: Report given to RN and Post -op Vital signs reviewed and stable  Post vital signs: Reviewed and stable  Last Vitals:  Vitals Value Taken Time  BP 105/65 08/01/21 1210  Temp 37.6 C 08/01/21 1048  Pulse 69 08/01/21 1212  Resp 14 08/01/21 1212  SpO2 97 % 08/01/21 1212  Vitals shown include unvalidated device data.  Last Pain:  Vitals:   08/01/21 1048  TempSrc: Temporal  PainSc: 0-No pain         Complications: No notable events documented.

## 2021-08-04 ENCOUNTER — Encounter: Payer: Self-pay | Admitting: Gastroenterology

## 2021-08-04 LAB — SURGICAL PATHOLOGY

## 2021-08-05 ENCOUNTER — Encounter: Payer: Self-pay | Admitting: Gastroenterology

## 2021-08-25 ENCOUNTER — Encounter: Payer: Self-pay | Admitting: Gastroenterology

## 2021-08-28 ENCOUNTER — Encounter: Payer: Self-pay | Admitting: Internal Medicine

## 2021-08-29 MED ORDER — EMPAGLIFLOZIN 25 MG PO TABS: 25.0000 mg | ORAL_TABLET | Freq: Every day | ORAL | 1 refills | Status: DC

## 2021-11-19 ENCOUNTER — Encounter: Payer: Self-pay | Admitting: Internal Medicine

## 2021-11-20 NOTE — Telephone Encounter (Signed)
Can you bring a CD of his shoulder x-ray?

## 2021-11-24 ENCOUNTER — Other Ambulatory Visit: Payer: Self-pay | Admitting: Internal Medicine

## 2021-11-24 NOTE — Telephone Encounter (Signed)
Requested Prescriptions  Pending Prescriptions Disp Refills   rosuvastatin (CRESTOR) 5 MG tablet [Pharmacy Med Name: ROSUVASTATIN CALCIUM 5 MG TAB] 90 tablet 1    Sig: TAKE 1 TABLET (5 MG TOTAL) BY MOUTH DAILY.     Cardiovascular:  Antilipid - Statins 2 Failed - 11/24/2021  2:01 AM      Failed - Cr in normal range and within 360 days    Creat  Date Value Ref Range Status  06/28/2020 0.94 0.60 - 1.35 mg/dL Final   Creatinine, Urine  Date Value Ref Range Status  01/09/2021 61 20 - 320 mg/dL Final         Failed - Lipid Panel in normal range within the last 12 months    Cholesterol, Total  Date Value Ref Range Status  06/18/2020 150 100 - 199 mg/dL Final   Cholesterol  Date Value Ref Range Status  01/09/2021 161 <200 mg/dL Final   LDL Cholesterol (Calc)  Date Value Ref Range Status  01/09/2021  mg/dL (calc) Final    Comment:    . LDL cholesterol not calculated. Triglyceride levels greater than 400 mg/dL invalidate calculated LDL results. . Reference range: <100 . Desirable range <100 mg/dL for primary prevention;   <70 mg/dL for patients with CHD or diabetic patients  with > or = 2 CHD risk factors. Marland Kitchen LDL-C is now calculated using the Martin-Hopkins  calculation, which is a validated novel method providing  better accuracy than the Friedewald equation in the  estimation of LDL-C.  Cresenciano Genre et al. Annamaria Helling. 0626;948(54): 2061-2068  (http://education.[employer].com/faq/FAQ164)    Direct LDL  Date Value Ref Range Status  01/04/2020 119.0 mg/dL Final    Comment:    Optimal:  <100 mg/dLNear or Above Optimal:  100-129 mg/dLBorderline High:  130-159 mg/dLHigh:  160-189 mg/dLVery High:  >190 mg/dL   HDL  Date Value Ref Range Status  01/09/2021 31 (L) > OR = 40 mg/dL Final  06/18/2020 29 (L) >39 mg/dL Final   Triglycerides  Date Value Ref Range Status  01/09/2021 416 (H) <150 mg/dL Final    Comment:    . If a non-fasting specimen was collected, consider repeat  triglyceride testing on a fasting specimen if clinically indicated.  Yates Decamp et al. J. of Clin. Lipidol. 6270;3:500-938. Marland Kitchen          Passed - Patient is not pregnant      Passed - Valid encounter within last 12 months    Recent Outpatient Visits           5 months ago Encounter for general adult medical examination with abnormal findings   [facility] [place], Coralie Keens, NP   6 months ago Sore throat   [facility], [place] W, NP   10 months ago Type 2 diabetes mellitus without complication, without long-term current use of insulin [facility])   [facility] [place], Coralie Keens, NP   1 year ago Type 2 diabetes mellitus with hyperglycemia, without long-term current use of insulin [facility])   [facility] [place], Coralie Keens, NP   1 year ago Type 2 diabetes mellitus with hyperglycemia, without long-term current use of insulin [facility])   [facility], Coralie Keens, NP       Future Appointments             In 1 month Baity, Coralie Keens, NP [facility]

## 2021-12-16 ENCOUNTER — Other Ambulatory Visit: Payer: Self-pay | Admitting: Internal Medicine

## 2021-12-16 NOTE — Telephone Encounter (Signed)
90 day courtesy refill- must keep appt for further refills  Requested Prescriptions  Pending Prescriptions Disp Refills   lisinopril (ZESTRIL) 10 MG tablet [Pharmacy Med Name: LISINOPRIL 10 MG TABLET] 90 tablet 0    Sig: TAKE 1 TABLET BY MOUTH EVERY DAY     Cardiovascular:  ACE Inhibitors Failed - 12/16/2021  2:05 AM      Failed - Cr in normal range and within 180 days    Creat  Date Value Ref Range Status  06/28/2020 0.94 0.60 - 1.35 mg/dL Final   Creatinine, Urine  Date Value Ref Range Status  01/09/2021 61 20 - 320 mg/dL Final         Failed - K in normal range and within 180 days    Potassium  Date Value Ref Range Status  06/28/2020 4.0 3.5 - 5.3 mmol/L Final         Passed - Patient is not pregnant      Passed - Last BP in normal range    BP Readings from Last 1 Encounters:  08/01/21 112/75         Passed - Valid encounter within last 6 months    Recent Outpatient Visits           5 months ago Encounter for general adult medical examination with abnormal findings   [facility] [place], Coralie Keens, NP   6 months ago Sore throat   [facility] [place], [place] W, NP   11 months ago Type 2 diabetes mellitus without complication, without long-term current use of insulin [facility])   [facility] [place], [place] W, NP   1 year ago Type 2 diabetes mellitus with hyperglycemia, without long-term current use of insulin [facility])   [facility] [place], [place] W, NP   1 year ago Type 2 diabetes mellitus with hyperglycemia, without long-term current use of insulin [facility])   [facility] [place], Coralie Keens, NP       Future Appointments             In 1 week Garnette Gunner, Coralie Keens, NP [facility], [facility]

## 2021-12-25 ENCOUNTER — Encounter: Payer: Self-pay | Admitting: Internal Medicine

## 2021-12-25 ENCOUNTER — Ambulatory Visit: Payer: Managed Care, Other (non HMO) | Admitting: Internal Medicine

## 2021-12-25 VITALS — BP 134/86 | HR 81 | Temp 96.9°F | Wt 228.0 lb

## 2021-12-25 DIAGNOSIS — Z1159 Encounter for screening for other viral diseases: Secondary | ICD-10-CM

## 2021-12-25 DIAGNOSIS — Z23 Encounter for immunization: Secondary | ICD-10-CM

## 2021-12-25 DIAGNOSIS — E119 Type 2 diabetes mellitus without complications: Secondary | ICD-10-CM

## 2021-12-25 DIAGNOSIS — Z6831 Body mass index (BMI) 31.0-31.9, adult: Secondary | ICD-10-CM

## 2021-12-25 DIAGNOSIS — G8929 Other chronic pain: Secondary | ICD-10-CM

## 2021-12-25 DIAGNOSIS — M25511 Pain in right shoulder: Secondary | ICD-10-CM

## 2021-12-25 DIAGNOSIS — E6609 Other obesity due to excess calories: Secondary | ICD-10-CM

## 2021-12-25 DIAGNOSIS — I10 Essential (primary) hypertension: Secondary | ICD-10-CM

## 2021-12-25 DIAGNOSIS — E785 Hyperlipidemia, unspecified: Secondary | ICD-10-CM

## 2021-12-25 DIAGNOSIS — E1169 Type 2 diabetes mellitus with other specified complication: Secondary | ICD-10-CM

## 2021-12-25 LAB — POCT GLYCOSYLATED HEMOGLOBIN (HGB A1C): Hemoglobin A1C: 7.5 % — AB (ref 4.0–5.6)

## 2021-12-25 NOTE — Addendum Note (Signed)
Addended by: Ashley Royalty E on: 12/25/2021 08:50 AM   Modules accepted: Orders

## 2021-12-25 NOTE — Assessment & Plan Note (Signed)
Borderline control on lisinopril Reinforced DASH diet and exercise for weight loss C-Met today

## 2021-12-25 NOTE — Assessment & Plan Note (Signed)
POCT A1c 7.5% Will check urine microalbumin Encouraged him to consume a low-carb diet and exercise for weight loss Continue glipizide and Jardiance Encouraged routine eye exam Encouraged routine foot exam He declines flu shots Pneumovax today Encouraged him to get his COVID booster

## 2021-12-25 NOTE — Assessment & Plan Note (Signed)
C-Met and lipid profile today Encouraged to consume a low-fat diet Continue rosuvastatin and fenofibrate

## 2021-12-25 NOTE — Patient Instructions (Signed)

## 2021-12-25 NOTE — Assessment & Plan Note (Signed)
Encourage diet and exercise for weight loss 

## 2021-12-25 NOTE — Assessment & Plan Note (Signed)
Encouraged shoulder exercises Currently following with orthopedics

## 2021-12-25 NOTE — Progress Notes (Signed)
Subjective:    Patient ID: Jerry Reid, male    DOB: 1975/04/26, 46 y.o.   MRN: [id number]  HPI  Patient presents to clinic today for 46-monthfollow-up of chronic conditions.  DM2: His last A1c was 7.3%, 06/2021.  He is taking Glipizide and Jardiance as prescribed.  He does not check his sugars.  He checks his feet routinely.  His last eye exam was 07/2020.  He declines flu shots.  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  [place].  HTN: His BP today is 134/86.  He is taking Lisinopril as prescribed.  ECG from 07/2010 reviewed.  HLD: His last LDL was not calculated, triglycerides 416, 12/2020.  He is taking Rosuvastatin and Fenofibrate as prescribed.  He does not consume a low-fat diet.  Bursitis, Right Shoulder: He had a recent Cortisone injection by orthopedics with some improvement in symptoms.  Review of Systems  Past Medical History:  Diagnosis Date   Chest pain    Chicken pox    Cholelithiasis    Diabetes mellitus without complication (HCC)    Dysplastic nevus    sees Dr. DEvorn GongPAC Dandridge    HTN (hypertension)    Hyperlipidemia    Kidney stones     Current Outpatient Medications  Medication Sig Dispense Refill   Blood Glucose Monitoring Suppl (ONETOUCH VERIO) w/Device KIT 1 Device by Does not apply route daily. 1 kit 0   empagliflozin (JARDIANCE) 25 MG TABS tablet Take 1 tablet (25 mg total) by mouth daily. 90 tablet 1   fenofibrate (TRICOR) 145 MG tablet TAKE 1 TABLET BY MOUTH EVERY DAY 90 tablet 1   glipiZIDE (GLUCOTROL XL) 10 MG 24 hr tablet TAKE 1 TABLET (10 MG TOTAL) BY MOUTH DAILY WITH BREAKFAST. 90 tablet 1   Lancets (ONETOUCH ULTRASOFT) lancets Use as instructed 100 each 12   lisinopril (ZESTRIL) 10 MG tablet TAKE 1 TABLET BY MOUTH EVERY DAY 90 tablet 0   ONETOUCH VERIO test strip 1 EACH BY OTHER ROUTE IN THE MORNING AND AT BEDTIME. USE AS INSTRUCTED 100 strip 12   rosuvastatin (CRESTOR) 5 MG tablet TAKE 1 TABLET (5 MG TOTAL) BY MOUTH DAILY. 90 tablet 1   No current  facility-administered medications for this visit.    Allergies  Allergen Reactions   Metformin And Related Diarrhea    Family History  Problem Relation Age of Onset   Heart attack Mother    Heart disease Mother    Hyperlipidemia Mother    Hypertension Mother    Diabetes Mother    Hypertension Father    Heart disease Father    Hyperlipidemia Father    Diabetes Father     Social History   Socioeconomic History   Marital status: Married    Spouse name: Not on file   Number of children: Not on file   Years of education: Not on file   Highest education level: Not on file  Occupational History   Not on file  Tobacco Use   Smoking status: Never   Smokeless tobacco: Never  Vaping Use   Vaping Use: Never used  Substance and Sexual Activity   Alcohol use: No   Drug use: No   Sexual activity: Not on file  Other Topics Concern   Not on file  Social History Narrative   Works for the county    Married 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  HEENT: Denies eye pain, eye redness, ear pain, ringing in the ears, wax buildup, runny nose, nasal congestion, bloody nose, or sore throat. Respiratory: Denies difficulty breathing, shortness of breath, cough or sputum production.   Cardiovascular: Denies chest pain, chest tightness, palpitations or swelling in the hands or feet.  Gastrointestinal: Denies abdominal pain, bloating, constipation, diarrhea or blood in the stool.  GU: Denies urgency, frequency, pain with urination, burning sensation, blood in urine, odor or discharge. Musculoskeletal: Pt reports right shoulder pain. Denies decrease in  range of motion, difficulty with gait, muscle pain or jointswelling.  Skin: Denies redness, rashes, lesions or ulcercations.  Neurological: Denies dizziness, difficulty with memory, difficulty with speech or problems with balance and coordination.  Psych: Denies anxiety, depression, SI/HI.  No other specific complaints in a complete review of systems (except as listed in HPI above).     Objective:   Physical Exam  BP 134/86 (BP Location: Right Arm, Patient Position: Sitting, Cuff Size: Large)   Pulse 81   Temp (!) 96.9 F (36.1 C) (Temporal)   Wt 228 lb (103.4 kg)   SpO2 97%   BMI 31.80 kg/m   Wt Readings from Last 3 Encounters:  08/01/21 234 lb (106.1 kg)  06/23/21 234 lb (106.1 kg)  01/09/21 233 lb 9.6 oz (106 kg)    General: Appears his stated age, obese, in NAD. Skin: Warm, dry and intact. No ulcerations noted. HEENT: Head: normal shape and size; Eyes: sclera white, no icterus, conjunctiva pink, PERRLA and EOMs intact;  Cardiovascular: Normal rate and rhythm. S1,S2 noted.  No murmur, rubs or gallops noted. No JVD or BLE edema. No carotid bruits noted. Pulmonary/Chest: Normal effort and positive vesicular breath sounds. No respiratory distress. No wheezes, rales or ronchi noted.  Musculoskeletal: Decreased external rotation of the right shoulder.  Normal internal rotation of the right shoulder.  Negative drop can test on the right.  No pain with palpation of the shoulder.  Strength 5/5 BUE.  Handgrips equal.  No difficulty with gait.  Neurological: Alert and oriented. Coordination normal.      BMET    Component Value Date/Time   NA 135 06/28/2020 0756   NA 139 07/19/2019 1005   K 4.0 06/28/2020 0756   CL 103 06/28/2020 0756   CO2 23 06/28/2020 0756   GLUCOSE 247 (H) 06/28/2020 0756   BUN 11 06/28/2020 0756   BUN 15 07/19/2019 1005   CREATININE 0.94 06/28/2020 0756   CALCIUM 9.3 06/28/2020 0756   GFRNONAA 96 06/28/2020 0756   GFRAA 111 06/28/2020 0756    Lipid  Panel     Component Value Date/Time   CHOL 161 01/09/2021 0816   CHOL 150 06/18/2020 1259   TRIG 416 (H) 01/09/2021 0816   HDL 31 (L) 01/09/2021 0816   HDL 29 (L) 06/18/2020 1259   CHOLHDL 5.2 (H) 01/09/2021 0816   LDLCALC  01/09/2021 0816     Comment:     . LDL cholesterol not calculated. Triglyceride levels greater than 400 mg/dL invalidate calculated LDL results. . Reference range: <100 . Desirable range <100 mg/dL for primary prevention;   <70 mg/dL for patients with CHD or diabetic patients  with > or = 2 CHD risk factors. Marland Kitchen  LDL-C is now calculated using the Martin-Hopkins  calculation, which is a validated novel method providing  better accuracy than the Friedewald equation in the  estimation of LDL-C.  Cresenciano Genre et al. Annamaria Helling. 6378;588(50): 2061-2068  (http://education.[facility].com/faq/FAQ164)     CBC    Component Value Date/Time   WBC 4.5 03/23/2018 0935   WBC 8.3 09/26/2015 0203   RBC 5.65 03/23/2018 0935   RBC 5.35 09/26/2015 0203   HGB 16.9 03/23/2018 0935   HCT 47.4 03/23/2018 0935   PLT 231 03/23/2018 0935   MCV 84 03/23/2018 0935   MCH 29.9 03/23/2018 0935   MCH 30.4 09/26/2015 0203   MCHC 35.7 03/23/2018 0935   MCHC 36.3 (H) 09/26/2015 0203   RDW 13.7 03/23/2018 0935   LYMPHSABS 1.3 03/23/2018 0935   EOSABS 0.0 03/23/2018 0935   BASOSABS 0.0 03/23/2018 0935    Hgb A1C Lab Results  Component Value Date   HGBA1C 7.3 (A) 06/23/2021           Assessment & Plan:      RTC in 3 months for your annual exam Webb Silversmith, NP

## 2021-12-26 LAB — LIPID PANEL
Cholesterol: 147 mg/dL (ref <200)
HDL: 34 mg/dL — ABNORMAL LOW (ref >=40)
LDL Cholesterol (Calc): 80 mg/dL (calc)
Non-HDL Cholesterol (Calc): 113 mg/dL (calc) (ref <130)
Total CHOL/HDL Ratio: 4.3 (calc) (ref <5.0)
Triglycerides: 248 mg/dL — ABNORMAL HIGH (ref <150)

## 2021-12-26 LAB — CBC
HCT: 49.7 % (ref 38.5–50.0)
Hemoglobin: 17.2 g/dL — ABNORMAL HIGH (ref 13.2–17.1)
MCH: 29.8 pg (ref 27.0–33.0)
MCHC: 34.6 g/dL (ref 32.0–36.0)
MCV: 86.1 fL (ref 80.0–100.0)
MPV: 9.5 fL (ref 7.5–12.5)
Platelets: 261 10*3/uL (ref 140–400)
RBC: 5.77 10*6/uL (ref 4.20–5.80)
RDW: 13.9 % (ref 11.0–15.0)
WBC: 7.6 10*3/uL (ref 3.8–10.8)

## 2021-12-26 LAB — COMPLETE METABOLIC PANEL WITH GFR
AG Ratio: 1.8 (calc) (ref 1.0–2.5)
ALT: 20 U/L (ref 9–46)
AST: 14 U/L (ref 10–40)
Albumin: 4.7 g/dL (ref 3.6–5.1)
Alkaline phosphatase (APISO): 41 U/L (ref 36–130)
BUN: 15 mg/dL (ref 7–25)
CO2: 25 mmol/L (ref 20–32)
Calcium: 9.8 mg/dL (ref 8.6–10.3)
Chloride: 101 mmol/L (ref 98–110)
Creat: 0.83 mg/dL (ref 0.60–1.29)
Globulin: 2.6 g/dL (calc) (ref 1.9–3.7)
Glucose, Bld: 229 mg/dL — ABNORMAL HIGH (ref 65–99)
Potassium: 3.9 mmol/L (ref 3.5–5.3)
Sodium: 136 mmol/L (ref 135–146)
Total Bilirubin: 1.7 mg/dL — ABNORMAL HIGH (ref 0.2–1.2)
Total Protein: 7.3 g/dL (ref 6.1–8.1)
eGFR: 108 mL/min/{1.73_m2} (ref >=60)

## 2021-12-26 LAB — MICROALBUMIN / CREATININE URINE RATIO
Creatinine, Urine: 48 mg/dL (ref 20–320)
Microalb, Ur: <0.2 mg/dL

## 2021-12-26 LAB — HEPATITIS C ANTIBODY: Hepatitis C Ab: NONREACTIVE

## 2022-01-14 ENCOUNTER — Other Ambulatory Visit: Payer: Self-pay | Admitting: Internal Medicine

## 2022-01-16 NOTE — Telephone Encounter (Signed)
Requested Prescriptions  Pending Prescriptions Disp Refills   glipiZIDE (GLUCOTROL XL) 10 MG 24 hr tablet [Pharmacy Med Name: GLIPIZIDE ER 10 MG TABLET] 90 tablet 1    Sig: TAKE 1 TABLET (10 MG TOTAL) BY MOUTH DAILY WITH BREAKFAST.     Endocrinology:  Diabetes - Sulfonylureas Passed - 01/14/2022  2:52 PM      Passed - HBA1C is between 0 and 7.9 and within 180 days    Hemoglobin A1C  Date Value Ref Range Status  12/25/2021 7.5 (A) 4.0 - 5.6 % Final   Hgb A1c MFr Bld  Date Value Ref Range Status  01/09/2021 6.7 (H) <5.7 % of total Hgb Final    Comment:    For someone without known diabetes, a hemoglobin A1c value of 6.5% or greater indicates that they may have  diabetes and this should be confirmed with a follow-up  test. . For someone with known diabetes, a value <7% indicates  that their diabetes is well controlled and a value  greater than or equal to 7% indicates suboptimal  control. A1c targets should be individualized based on  duration of diabetes, age, comorbid conditions, and  other considerations. . Currently, no consensus exists regarding use of hemoglobin A1c for diagnosis of diabetes for children. .          Passed - Cr in normal range and within 360 days    Creat  Date Value Ref Range Status  12/25/2021 0.83 0.60 - 1.29 mg/dL Final   Creatinine, Urine  Date Value Ref Range Status  12/25/2021 48 20 - 320 mg/dL Final         Passed - Valid encounter within last 6 months    Recent Outpatient Visits           3 weeks ago Type 2 diabetes mellitus without complication, without long-term current use of insulin ([place])   [facility] [place], Coralie Keens, NP   6 months ago Encounter for general adult medical examination with abnormal findings   [facility], Coralie Keens, NP   7 months ago Sore throat   [facility] [place], Coralie Keens, NP   1 year ago Type 2 diabetes mellitus without complication, without long-term current  use of insulin ([place])   [facility] [place], Coralie Keens, NP   1 year ago Type 2 diabetes mellitus with hyperglycemia, without long-term current use of insulin ([place])   [facility] [place], Coralie Keens, NP       Future Appointments             In 2 months Baity, Coralie Keens, NP [facility]             fenofibrate (TRICOR) 145 MG tablet [Pharmacy Med Name: FENOFIBRATE 145 MG TABLET] 90 tablet 1    Sig: TAKE 1 TABLET BY MOUTH EVERY DAY     Cardiovascular:  Antilipid - Fibric Acid Derivatives Failed - 01/14/2022  2:52 PM      Failed - HGB in normal range and within 360 days    Hemoglobin  Date Value Ref Range Status  12/25/2021 17.2 (H) 13.2 - 17.1 g/dL Final  03/23/2018 16.9 13.0 - 17.7 g/dL Final         Failed - Lipid Panel in normal range within the last 12 months    Cholesterol, Total  Date Value Ref Range Status  06/18/2020 150 100 - 199 mg/dL Final  Cholesterol  Date Value Ref Range Status  12/25/2021 147 <200 mg/dL Final   LDL Cholesterol (Calc)  Date Value Ref Range Status  12/25/2021 80 mg/dL (calc) Final    Comment:    Reference range: <100 . Desirable range <100 mg/dL for primary prevention;   <70 mg/dL for patients with CHD or diabetic patients  with > or = 2 CHD risk factors. Marland Kitchen LDL-C is now calculated using the Martin-Hopkins  calculation, which is a validated novel method providing  better accuracy than the Friedewald equation in the  estimation of LDL-C.  Cresenciano Genre et al. Annamaria Helling. 7741;287(86): 2061-2068  (http://education.Quest[employer].com/faq/FAQ164)    Direct LDL  Date Value Ref Range Status  01/04/2020 119.0 mg/dL Final    Comment:    Optimal:  <100 mg/dLNear or Above Optimal:  100-129 mg/dLBorderline High:  130-159 mg/dLHigh:  160-189 mg/dLVery High:  >190 mg/dL   HDL  Date Value Ref Range Status  12/25/2021 34 (L) > OR = 40 mg/dL Final  06/18/2020 29 (L) >39 mg/dL Final   Triglycerides  Date  Value Ref Range Status  12/25/2021 248 (H) <150 mg/dL Final    Comment:    . If a non-fasting specimen was collected, consider repeat triglyceride testing on a fasting specimen if clinically indicated.  Yates Decamp et al. J. of Clin. Lipidol. 7672;0:947-096. Marland Kitchen          Passed - ALT in normal range and within 360 days    ALT  Date Value Ref Range Status  12/25/2021 20 9 - 46 U/L Final         Passed - AST in normal range and within 360 days    AST  Date Value Ref Range Status  12/25/2021 14 10 - 40 U/L Final         Passed - Cr in normal range and within 360 days    Creat  Date Value Ref Range Status  12/25/2021 0.83 0.60 - 1.29 mg/dL Final   Creatinine, Urine  Date Value Ref Range Status  12/25/2021 48 20 - 320 mg/dL Final         Passed - HCT in normal range and within 360 days    HCT  Date Value Ref Range Status  12/25/2021 49.7 38.5 - 50.0 % Final   Hematocrit  Date Value Ref Range Status  03/23/2018 47.4 37.5 - 51.0 % Final         Passed - PLT in normal range and within 360 days    Platelets  Date Value Ref Range Status  12/25/2021 261 140 - 400 Thousand/uL Final  03/23/2018 231 150 - 450 x10E3/uL Final         Passed - WBC in normal range and within 360 days    WBC  Date Value Ref Range Status  12/25/2021 7.6 3.8 - 10.8 Thousand/uL Final         Passed - eGFR is 30 or above and within 360 days    GFR, Est African American  Date Value Ref Range Status  06/28/2020 111 > OR = 60 mL/min/1.15m Final   GFR, Est Non African American  Date Value Ref Range Status  06/28/2020 96 > OR = 60 mL/min/1.724mFinal   GFR  Date Value Ref Range Status  11/17/2018 80.55 >60.00 mL/min Final   eGFR  Date Value Ref Range Status  12/25/2021 108 > OR = 60 mL/min/1.7364minal         Passed - Valid encounter within last  12 months    Recent Outpatient Visits           3 weeks ago Type 2 diabetes mellitus without complication, without long-term current use of  insulin ([place])   [facility] [place], Coralie Keens, NP   6 months ago Encounter for general adult medical examination with abnormal findings   [facility] [place], Coralie Keens, NP   7 months ago Sore throat   [facility] [place], [place] W, NP   1 year ago Type 2 diabetes mellitus without complication, without long-term current use of insulin [facility])   [facility] [place], Coralie Keens, NP   1 year ago Type 2 diabetes mellitus with hyperglycemia, without long-term current use of insulin [facility])   [facility], Coralie Keens, NP       Future Appointments             In 2 months Baity, Coralie Keens, NP [facility]

## 2022-03-25 ENCOUNTER — Other Ambulatory Visit: Payer: Self-pay | Admitting: Internal Medicine

## 2022-03-25 NOTE — Telephone Encounter (Signed)
Requested Prescriptions  Pending Prescriptions Disp Refills   lisinopril (ZESTRIL) 10 MG tablet [Pharmacy Med Name: LISINOPRIL 10 MG TABLET] 90 tablet 1    Sig: TAKE 1 TABLET BY MOUTH EVERY DAY     Cardiovascular:  ACE Inhibitors Passed - 03/25/2022  1:47 AM      Passed - Cr in normal range and within 180 days    Creat  Date Value Ref Range Status  12/25/2021 0.83 0.60 - 1.29 mg/dL Final   Creatinine, Urine  Date Value Ref Range Status  12/25/2021 48 20 - 320 mg/dL Final         Passed - K in normal range and within 180 days    Potassium  Date Value Ref Range Status  12/25/2021 3.9 3.5 - 5.3 mmol/L Final         Passed - Patient is not pregnant      Passed - Last BP in normal range    BP Readings from Last 1 Encounters:  12/25/21 134/86         Passed - Valid encounter within last 6 months    Recent Outpatient Visits           3 months ago Type 2 diabetes mellitus without complication, without long-term current use of insulin [facility])   [facility] [place], Coralie Keens, NP   9 months ago Encounter for general adult medical examination with abnormal findings   [facility] [place], Coralie Keens, NP   10 months ago Sore throat   [facility] [place], [place] W, NP   1 year ago Type 2 diabetes mellitus without complication, without long-term current use of insulin [facility])   [facility] [place], [place] W, NP   1 year ago Type 2 diabetes mellitus with hyperglycemia, without long-term current use of insulin [facility])   [facility] [place], Coralie Keens, NP       Future Appointments             In 2 days Garnette Gunner, Coralie Keens, NP [facility]

## 2022-03-26 ENCOUNTER — Ambulatory Visit: Payer: Managed Care, Other (non HMO) | Admitting: Internal Medicine

## 2022-03-27 ENCOUNTER — Ambulatory Visit: Payer: Managed Care, Other (non HMO) | Admitting: Internal Medicine

## 2022-03-27 ENCOUNTER — Encounter: Payer: Self-pay | Admitting: Internal Medicine

## 2022-03-27 VITALS — BP 104/72 | HR 78 | Temp 96.4°F | Wt 234.0 lb

## 2022-03-27 DIAGNOSIS — E1165 Type 2 diabetes mellitus with hyperglycemia: Secondary | ICD-10-CM | POA: Diagnosis not present

## 2022-03-27 DIAGNOSIS — Z6832 Body mass index (BMI) 32.0-32.9, adult: Secondary | ICD-10-CM

## 2022-03-27 DIAGNOSIS — N529 Male erectile dysfunction, unspecified: Secondary | ICD-10-CM | POA: Insufficient documentation

## 2022-03-27 DIAGNOSIS — N522 Drug-induced erectile dysfunction: Secondary | ICD-10-CM

## 2022-03-27 DIAGNOSIS — M25511 Pain in right shoulder: Secondary | ICD-10-CM

## 2022-03-27 DIAGNOSIS — E785 Hyperlipidemia, unspecified: Secondary | ICD-10-CM

## 2022-03-27 DIAGNOSIS — G8929 Other chronic pain: Secondary | ICD-10-CM

## 2022-03-27 DIAGNOSIS — E6609 Other obesity due to excess calories: Secondary | ICD-10-CM

## 2022-03-27 DIAGNOSIS — I10 Essential (primary) hypertension: Secondary | ICD-10-CM | POA: Diagnosis not present

## 2022-03-27 DIAGNOSIS — E1169 Type 2 diabetes mellitus with other specified complication: Secondary | ICD-10-CM

## 2022-03-27 MED ORDER — EMPAGLIFLOZIN 25 MG PO TABS: 25.0000 mg | ORAL_TABLET | Freq: Every day | ORAL | 1 refills | Status: DC

## 2022-03-27 MED ORDER — SILDENAFIL CITRATE 50 MG PO TABS: 50.0000 mg | ORAL_TABLET | Freq: Every day | ORAL | 5 refills | Status: AC | PRN

## 2022-03-27 NOTE — Assessment & Plan Note (Signed)
Needs surgical intervention Okay to continue Tylenol or ibuprofen OTC as needed He will continue to follow with orthopedics

## 2022-03-27 NOTE — Assessment & Plan Note (Signed)
A1c today Urine microalbumin has been checked within the last year Encouraged her to consume a low-carb diet and exercise for weight loss Continue glipizide and Jardiance Encouraged routine eye exam Encouraged her to foot exam Encouraged him to get a flu shot in the fall Pneumovax UTD Encouraged him to get his COVID booster

## 2022-03-27 NOTE — Progress Notes (Signed)
Subjective:    Patient ID: Jerry Reid, male    DOB: July 12, 1975, 47 y.o.   MRN: [id number]  HPI  Patient presents to clinic today for follow-up of chronic conditions.  DM2: His last A1c was 7.5%, 12/2021.  He is taking Glipizide and Jardiance as prescribed.  He does not check his sugars.  He checks his feet routinely.  His last eye exam was 07/2020.  Flu never.  Pneumovax 12/2021.  [place] x 2.  HTN: His BP today is 104/72.  He is taking Lisinopril as prescribed.  ECG from 07/2010 reviewed.  HLD: His last LDL was 80, triglycerides 248, 12/2021.  He is taking Rosuvastatin and Fenofibrate as prescribed.  He does not consume low-fat diet.  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 He takes Tylenol or Ibuprofen  as needed with some relief of symptoms.  He follows with orthopedics.  ED: He is having difficulty maintaining erections. He has taken medication for this in the past but can't remember the name of it.  He has seen neurology in the past when he was undergoing infertility workup.  Review of Systems     Past Medical History:  Diagnosis Date   Chest pain    Chicken pox    Cholelithiasis    Diabetes mellitus without complication (HCC)    Dysplastic nevus    sees Dr. Evorn Gong [facility] [place]    HTN (hypertension)    Hyperlipidemia    Kidney stones     Current Outpatient Medications  Medication Sig Dispense Refill   Blood Glucose Monitoring Suppl (ONETOUCH VERIO) w/Device KIT 1 Device by Does not apply route daily. 1 kit 0   empagliflozin (JARDIANCE) 25 MG TABS tablet Take 1 tablet (25 mg total) by mouth daily. 90 tablet 1   fenofibrate (TRICOR) 145 MG tablet TAKE 1 TABLET BY MOUTH EVERY DAY 90 tablet 1   glipiZIDE (GLUCOTROL XL) 10 MG 24 hr tablet TAKE 1 TABLET (10 MG TOTAL) BY MOUTH DAILY WITH BREAKFAST. 90 tablet 1   Lancets (ONETOUCH ULTRASOFT) lancets Use as instructed 100 each 12   lisinopril (ZESTRIL) 10 MG tablet TAKE 1 TABLET BY MOUTH EVERY DAY  90 tablet 1   ONETOUCH VERIO test strip 1 EACH BY OTHER ROUTE IN THE MORNING AND AT BEDTIME. USE AS INSTRUCTED 100 strip 12   rosuvastatin (CRESTOR) 5 MG tablet TAKE 1 TABLET (5 MG TOTAL) BY MOUTH DAILY. 90 tablet 1   No current facility-administered medications for this visit.    Allergies  Allergen Reactions   Metformin And Related Diarrhea    Family History  Problem Relation Age of Onset   Heart attack Mother    Heart disease Mother    Hyperlipidemia Mother    Hypertension Mother    Diabetes Mother    Hypertension Father    Heart disease Father    Hyperlipidemia Father    Diabetes Father     Social History   Socioeconomic History   Marital status: Married    Spouse name: Not on file   Number of children: Not on file   Years of education: Not on file   Highest education level: Not on file  Occupational History   Not on file  Tobacco Use   Smoking status: Never   Smokeless tobacco: Never  Vaping Use   Vaping Use: Never used  Substance and Sexual Activity   Alcohol use: No   Drug use: No   Sexual activity: Not on file  Other Topics Concern   Not on file  Social History Narrative   Works for the county    Married 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 Health and safety inspector   Social Determinants of Health   Financial Resource Strain: Not on file  Food Insecurity: Not on file  Transportation Needs: Not on file  Physical Activity: Not on file  Stress: Not on file  Social Connections: Not on file  Intimate Partner Violence: Not on file     Constitutional: Denies fever, malaise, fatigue, headache or abrupt weight changes.  HEENT: Denies eye pain, eye redness, ear pain, ringing in the ears, wax buildup, runny nose, nasal congestion, bloody nose, or sore throat. Respiratory: Denies difficulty breathing, shortness of breath, cough or sputum production.   Cardiovascular: Denies chest pain, chest tightness, palpitations or swelling in the hands or feet.  Gastrointestinal: Denies abdominal pain,  bloating, constipation, diarrhea or blood in the stool.  GU: Patient reports erectile dysfunction.  Denies urgency, frequency, pain with urination, burning sensation, blood in urine, odor or discharge. Musculoskeletal: Pt reports right shoulder pain and weakness. Denies difficulty with gait, muscle pain or joint swelling.  Skin: Denies redness, rashes, lesions or ulcercations.  Neurological: Denies dizziness, difficulty with memory, difficulty with speech or problems with balance and coordination.  Psych: Denies anxiety, depression, SI/HI.  No other specific complaints in a complete review of systems (except as listed in HPI above).  Objective:   Physical Exam  BP 104/72 (BP Location: Left Arm, Patient Position: Sitting, Cuff Size: Large)   Pulse 78   Temp (!) 96.4 F (35.8 C) (Temporal)   Wt 234 lb (106.1 kg)   SpO2 97%   BMI 32.64 kg/m   Wt Readings from Last 3 Encounters:  12/25/21 228 lb (103.4 kg)  08/01/21 234 lb (106.1 kg)  06/23/21 234 lb (106.1 kg)    General: Appears his stated age, obese in NAD. Skin: Warm, dry and intact. No rashes noted. HEENT: Head: normal shape and size; Eyes: sclera white, no icterus, conjunctiva pink, PERRLA and EOMs intact;  Cardiovascular: Normal rate and rhythm. S1,S2 noted.  No murmur, rubs or gallops noted. No JVD or BLE edema. No carotid bruits noted. Pulmonary/Chest: Normal effort and positive vesicular breath sounds. No respiratory distress. No wheezes, rales or ronchi noted.  Musculoskeletal: Normal external rotation of the right shoulder, decreased internal rotation of the right shoulder.  No pain with palpation of the right shoulder.  Strength 5/5 BUE.  No difficulty with gait.  Neurological: Alert and oriented. Cranial nerves II-XII grossly intact. Coordination normal.  Psychiatric: Mood and affect normal. Behavior is normal. Judgment and thought content normal.    BMET    Component Value Date/Time   NA 136 12/25/2021 0853   NA  139 07/19/2019 1005   K 3.9 12/25/2021 0853   CL 101 12/25/2021 0853   CO2 25 12/25/2021 0853   GLUCOSE 229 (H) 12/25/2021 0853   BUN 15 12/25/2021 0853   BUN 15 07/19/2019 1005   CREATININE 0.83 12/25/2021 0853   CALCIUM 9.8 12/25/2021 0853   GFRNONAA 96 06/28/2020 0756   GFRAA 111 06/28/2020 0756    Lipid Panel     Component Value Date/Time   CHOL 147 12/25/2021 0853   CHOL 150 06/18/2020 1259   TRIG 248 (H) 12/25/2021 0853   HDL 34 (L) 12/25/2021 0853   HDL 29 (L) 06/18/2020 1259   CHOLHDL 4.3 12/25/2021 0853   LDLCALC 80 12/25/2021 0853    CBC  Component Value Date/Time   WBC 7.6 12/25/2021 0853   RBC 5.77 12/25/2021 0853   HGB 17.2 (H) 12/25/2021 0853   HGB 16.9 03/23/2018 0935   HCT 49.7 12/25/2021 0853   HCT 47.4 03/23/2018 0935   PLT 261 12/25/2021 0853   PLT 231 03/23/2018 0935   MCV 86.1 12/25/2021 0853   MCV 84 03/23/2018 0935   MCH 29.8 12/25/2021 0853   MCHC 34.6 12/25/2021 0853   RDW 13.9 12/25/2021 0853   RDW 13.7 03/23/2018 0935   LYMPHSABS 1.3 03/23/2018 0935   EOSABS 0.0 03/23/2018 0935   BASOSABS 0.0 03/23/2018 0935    Hgb A1C Lab Results  Component Value Date   HGBA1C 7.5 (A) 12/25/2021            Assessment & Plan:   RTC in 3 months for annual exam Webb Silversmith, NP

## 2022-03-27 NOTE — Assessment & Plan Note (Signed)
Controlled on lisinopril Reinforced DASH diet and exercise for weight loss C-Met today 

## 2022-03-27 NOTE — Assessment & Plan Note (Signed)
Encourage diet and exercise for weight loss 

## 2022-03-27 NOTE — Patient Instructions (Signed)

## 2022-03-27 NOTE — Assessment & Plan Note (Signed)
C-Met and lipid profile today Encouraged him to consume a low-fat diet Continue rosuvastatin and fenofibrate 

## 2022-03-27 NOTE — Assessment & Plan Note (Signed)
Will trial sildenafil

## 2022-03-28 LAB — COMPLETE METABOLIC PANEL WITH GFR
AG Ratio: 1.8 (calc) (ref 1.0–2.5)
ALT: 28 U/L (ref 9–46)
AST: 17 U/L (ref 10–40)
Albumin: 4.8 g/dL (ref 3.6–5.1)
Alkaline phosphatase (APISO): 36 U/L (ref 36–130)
BUN: 19 mg/dL (ref 7–25)
CO2: 21 mmol/L (ref 20–32)
Calcium: 9.7 mg/dL (ref 8.6–10.3)
Chloride: 104 mmol/L (ref 98–110)
Creat: 0.98 mg/dL (ref 0.60–1.29)
Globulin: 2.6 g/dL (calc) (ref 1.9–3.7)
Glucose, Bld: 178 mg/dL — ABNORMAL HIGH (ref 65–99)
Potassium: 4.2 mmol/L (ref 3.5–5.3)
Sodium: 137 mmol/L (ref 135–146)
Total Bilirubin: 1.4 mg/dL — ABNORMAL HIGH (ref 0.2–1.2)
Total Protein: 7.4 g/dL (ref 6.1–8.1)
eGFR: 95 mL/min/{1.73_m2} (ref >=60)

## 2022-03-28 LAB — LIPID PANEL
Cholesterol: 155 mg/dL (ref <200)
HDL: 35 mg/dL — ABNORMAL LOW (ref >=40)
LDL Cholesterol (Calc): 88 mg/dL (calc)
Non-HDL Cholesterol (Calc): 120 mg/dL (calc) (ref <130)
Total CHOL/HDL Ratio: 4.4 (calc) (ref <5.0)
Triglycerides: 230 mg/dL — ABNORMAL HIGH (ref <150)

## 2022-03-28 LAB — HEMOGLOBIN A1C
Hgb A1c MFr Bld: 7.7 % of total Hgb — ABNORMAL HIGH (ref <5.7)
Mean Plasma Glucose: 174 mg/dL
eAG (mmol/L): 9.7 mmol/L

## 2022-04-02 ENCOUNTER — Encounter: Payer: Self-pay | Admitting: Internal Medicine

## 2022-04-02 MED ORDER — ROSUVASTATIN CALCIUM 10 MG PO TABS: 10.0000 mg | ORAL_TABLET | Freq: Every day | ORAL | 1 refills | Status: DC

## 2022-04-02 MED ORDER — TIRZEPATIDE 2.5 MG/0.5ML ~~LOC~~ SOAJ: 2.5000 mg | SUBCUTANEOUS | 0 refills | Status: DC

## 2022-05-26 ENCOUNTER — Other Ambulatory Visit: Payer: Self-pay | Admitting: Internal Medicine

## 2022-05-26 NOTE — Telephone Encounter (Signed)
Dc'd 04/02/22 by Pamala Hurry NP   Requested Prescriptions  Refused Prescriptions Disp Refills   rosuvastatin (CRESTOR) 5 MG tablet [Pharmacy Med Name: ROSUVASTATIN CALCIUM 5 MG TAB] 90 tablet 1    Sig: TAKE 1 TABLET (5 MG TOTAL) BY MOUTH DAILY.     Cardiovascular:  Antilipid - Statins 2 Failed - 05/26/2022  2:39 AM      Failed - Lipid Panel in normal range within the last 12 months    Cholesterol, Total  Date Value Ref Range Status  06/18/2020 150 100 - 199 mg/dL Final   Cholesterol  Date Value Ref Range Status  03/27/2022 155 <200 mg/dL Final   LDL Cholesterol (Calc)  Date Value Ref Range Status  03/27/2022 88 mg/dL (calc) Final    Comment:    Reference range: <100 . Desirable range <100 mg/dL for primary prevention;   <70 mg/dL for patients with CHD or diabetic patients  with > or = 2 CHD risk factors. Marland Kitchen LDL-C is now calculated using the Martin-Hopkins  calculation, which is a validated novel method providing  better accuracy than the Friedewald equation in the  estimation of LDL-C.  Horald Pollen et al. Lenox Ahr. 9604;540(98): 2061-2068  (http://education.[facility].com/faq/FAQ164)    Direct LDL  Date Value Ref Range Status  01/04/2020 119.0 mg/dL Final    Comment:    Optimal:  <100 mg/dLNear or Above Optimal:  100-129 mg/dLBorderline High:  130-159 mg/dLHigh:  160-189 mg/dLVery High:  >190 mg/dL   HDL  Date Value Ref Range Status  03/27/2022 35 (L) > OR = 40 mg/dL Final  11/91/4782 29 (L) >39 mg/dL Final   Triglycerides  Date Value Ref Range Status  03/27/2022 230 (H) <150 mg/dL Final    Comment:    . If a non-fasting specimen was collected, consider repeat triglyceride testing on a fasting specimen if clinically indicated.  Perry Mount et al. J. of Clin. Lipidol. 2015;9:129-169. Marland Kitchen          Passed - Cr in normal range and within 360 days    Creat  Date Value Ref Range Status  03/27/2022 0.98 0.60 - 1.29 mg/dL Final   Creatinine, Urine  Date Value Ref Range  Status  12/25/2021 48 20 - 320 mg/dL Final         Passed - Patient is not pregnant      Passed - Valid encounter within last 12 months    Recent Outpatient Visits           2 months ago Type 2 diabetes mellitus with hyperglycemia, without long-term current use of insulin [facility])   [facility] [place], [place] W, NP   5 months ago Type 2 diabetes mellitus without complication, without long-term current use of insulin [facility])   [facility] [place], Salvadore Oxford, NP   11 months ago Encounter for general adult medical examination with abnormal findings   [facility] [place], Salvadore Oxford, NP   1 year ago Sore throat   [facility] [place], [place] W, NP   1 year ago Type 2 diabetes mellitus without complication, without long-term current use of insulin [facility])   [place] [facility] [place], Salvadore Oxford, NP       Future Appointments             In 1 month Baity, Salvadore Oxford, NP [place] [facility], [facility]

## 2022-06-25 ENCOUNTER — Ambulatory Visit (INDEPENDENT_AMBULATORY_CARE_PROVIDER_SITE_OTHER): Payer: Managed Care, Other (non HMO) | Admitting: Internal Medicine

## 2022-06-25 ENCOUNTER — Encounter: Payer: Self-pay | Admitting: Internal Medicine

## 2022-06-25 ENCOUNTER — Other Ambulatory Visit: Payer: Self-pay | Admitting: Internal Medicine

## 2022-06-25 VITALS — BP 112/78 | HR 88 | Temp 96.6°F | Ht 71.0 in | Wt 233.0 lb

## 2022-06-25 DIAGNOSIS — Z0001 Encounter for general adult medical examination with abnormal findings: Secondary | ICD-10-CM | POA: Diagnosis not present

## 2022-06-25 DIAGNOSIS — E1165 Type 2 diabetes mellitus with hyperglycemia: Secondary | ICD-10-CM | POA: Diagnosis not present

## 2022-06-25 DIAGNOSIS — E6609 Other obesity due to excess calories: Secondary | ICD-10-CM | POA: Diagnosis not present

## 2022-06-25 DIAGNOSIS — Z6832 Body mass index (BMI) 32.0-32.9, adult: Secondary | ICD-10-CM

## 2022-06-25 MED ORDER — TIRZEPATIDE 2.5 MG/0.5ML ~~LOC~~ SOAJ: 2.5000 mg | SUBCUTANEOUS | 0 refills | Status: DC

## 2022-06-25 MED ORDER — ROSUVASTATIN CALCIUM 10 MG PO TABS: 10.0000 mg | ORAL_TABLET | Freq: Every day | ORAL | 1 refills | Status: DC

## 2022-06-25 NOTE — Patient Instructions (Signed)
Health Maintenance, Male Adopting a healthy lifestyle and getting preventive care are important in promoting health and wellness. Ask your health care provider about: The right schedule for you to have regular tests and exams. Things you can do on your own to prevent diseases and keep yourself healthy. What should I know about diet, weight, and exercise? Eat a healthy diet  Eat a diet that includes plenty of vegetables, fruits, low-fat dairy products, and lean protein. Do not eat a lot of foods that are high in solid fats, added sugars, or sodium. Maintain a healthy weight Body mass index (BMI) is a measurement that can be used to identify possible weight problems. It estimates body fat based on height and weight. Your health care provider can help determine your BMI and help you achieve or maintain a healthy weight. Get regular exercise Get regular exercise. This is one of the most important things you can do for your health. Most adults should: Exercise for at least 150 minutes each week. The exercise should increase your heart rate and make you sweat (moderate-intensity exercise). Do strengthening exercises at least twice a week. This is in addition to the moderate-intensity exercise. Spend less time sitting. Even light physical activity can be beneficial. Watch cholesterol and blood lipids Have your blood tested for lipids and cholesterol at 49 years of age, then have this test every 5 years. You may need to have your cholesterol levels checked more often if: Your lipid or cholesterol levels are high. You are older than 49 years of age. You are at high risk for heart disease. What should I know about cancer screening? Many types of cancers can be detected early and may often be prevented. Depending on your health history and family history, you may need to have cancer screening at various ages. This may include screening for: Colorectal cancer. Prostate cancer. Skin cancer. Lung  cancer. What should I know about heart disease, diabetes, and high blood pressure? Blood pressure and heart disease High blood pressure causes heart disease and increases the risk of stroke. This is more likely to develop in people who have high blood pressure readings or are overweight. Talk with your health care provider about your target blood pressure readings. Have your blood pressure checked: Every 3-5 years if you are 18-39 years of age. Every year if you are 40 years old or older. If you are between the ages of 65 and 75 and are a current or former smoker, ask your health care provider if you should have a one-time screening for abdominal aortic aneurysm (AAA). Diabetes Have regular diabetes screenings. This checks your fasting blood sugar level. Have the screening done: Once every three years after age 45 if you are at a normal weight and have a low risk for diabetes. More often and at a younger age if you are overweight or have a high risk for diabetes. What should I know about preventing infection? Hepatitis B If you have a higher risk for hepatitis B, you should be screened for this virus. Talk with your health care provider to find out if you are at risk for hepatitis B infection. Hepatitis C Blood testing is recommended for: Everyone born from 1945 through 1965. Anyone with known risk factors for hepatitis C. Sexually transmitted infections (STIs) You should be screened each year for STIs, including gonorrhea and chlamydia, if: You are sexually active and are younger than 49 years of age. You are older than 49 years of age and your   health care provider tells you that you are at risk for this type of infection. Your sexual activity has changed since you were last screened, and you are at increased risk for chlamydia or gonorrhea. Ask your health care provider if you are at risk. Ask your health care provider about whether you are at high risk for HIV. Your health care provider  may recommend a prescription medicine to help prevent HIV infection. If you choose to take medicine to prevent HIV, you should first get tested for HIV. You should then be tested every 3 months for as long as you are taking the medicine. Follow these instructions at home: Alcohol use Do not drink alcohol if your health care provider tells you not to drink. If you drink alcohol: Limit how much you have to 0-2 drinks a day. Know how much alcohol is in your drink. In the [place] one drink equals one 12 oz bottle of beer (355 mL), one 5 oz glass of wine (148 mL), or one 1 oz glass of hard liquor (44 mL). Lifestyle Do not use any products that contain nicotine or tobacco. These products include cigarettes, chewing tobacco, and vaping devices, such as e-cigarettes. If you need help quitting, ask your health care provider. Do not use street drugs. Do not share needles. Ask your health care provider for help if you need support or information about quitting drugs. General instructions Schedule regular health, dental, and eye exams. Stay current with your vaccines. Tell your health care provider if: You often feel depressed. You have ever been abused or do not feel safe at home. Summary Adopting a healthy lifestyle and getting preventive care are important in promoting health and wellness. Follow your health care provider's instructions about healthy diet, exercising, and getting tested or screened for diseases. Follow your health care provider's instructions on monitoring your cholesterol and blood pressure. This information is not intended to replace advice given to you by your health care provider. Make sure you discuss any questions you have with your health care provider. Document Revised: 05/27/2020 Document Reviewed: 05/27/2020 Elsevier Patient Education  2024 Elsevier Inc.  

## 2022-06-25 NOTE — Assessment & Plan Note (Signed)
Signs for weight loss Will refill Mounjaro

## 2022-06-25 NOTE — Progress Notes (Signed)
Subjective:    Patient ID: Jerry Reid, male    DOB: 11/13/1975, 47 y.o.   MRN: [id number]  HPI  Patient presents to clinic today for his annual exam.  Flu: never Tetanus: 06/2021 COVID: Pfizer x 2 Pneumovax: 12/2021 Colon screening: 07/2021 Vision screening: annually Dentist: biannually  Diet: He does eat meat. He consumes fruits and veggies. He does eat some fried foods. He drinks mostly Dt. Soda, flavored water Exercise: None  Review of Systems     Past Medical History:  Diagnosis Date   Chest pain    Chicken pox    Cholelithiasis    Diabetes mellitus without complication (HCC)    Dysplastic nevus    sees Dr. Adolphus Birchwood PAC [place]    HTN (hypertension)    Hyperlipidemia    Kidney stones     Current Outpatient Medications  Medication Sig Dispense Refill   Blood Glucose Monitoring Suppl (ONETOUCH VERIO) w/Device KIT 1 Device by Does not apply route daily. 1 kit 0   empagliflozin (JARDIANCE) 25 MG TABS tablet Take 1 tablet (25 mg total) by mouth daily. 90 tablet 1   fenofibrate (TRICOR) 145 MG tablet TAKE 1 TABLET BY MOUTH EVERY DAY 90 tablet 1   glipiZIDE (GLUCOTROL XL) 10 MG 24 hr tablet TAKE 1 TABLET (10 MG TOTAL) BY MOUTH DAILY WITH BREAKFAST. 90 tablet 1   Lancets (ONETOUCH ULTRASOFT) lancets Use as instructed 100 each 12   lisinopril (ZESTRIL) 10 MG tablet TAKE 1 TABLET BY MOUTH EVERY DAY 90 tablet 1   ONETOUCH VERIO test strip 1 EACH BY OTHER ROUTE IN THE MORNING AND AT BEDTIME. USE AS INSTRUCTED 100 strip 12   rosuvastatin (CRESTOR) 10 MG tablet Take 1 tablet (10 mg total) by mouth daily. 90 tablet 1   sildenafil (VIAGRA) 50 MG tablet Take 1 tablet (50 mg total) by mouth daily as needed for erectile dysfunction. 10 tablet 5   tirzepatide (MOUNJARO) 2.5 MG/0.5ML Pen Inject 2.5 mg into the skin once a week. 2 mL 0   No current facility-administered medications for this visit.    Allergies  Allergen Reactions   Metformin And Related Diarrhea    Family  History  Problem Relation Age of Onset   Heart attack Mother    Heart disease Mother    Hyperlipidemia Mother    Hypertension Mother    Diabetes Mother    Hypertension Father    Heart disease Father    Hyperlipidemia Father    Diabetes Father     Social History   Socioeconomic History   Marital status: Married    Spouse name: Not on file   Number of children: Not on file   Years of education: Not on file   Highest education level: Not on file  Occupational History   Not on file  Tobacco Use   Smoking status: Never   Smokeless tobacco: Never  Vaping Use   Vaping Use: Never used  Substance and Sexual Activity   Alcohol use: No   Drug use: No   Sexual activity: Not on file  Other Topics Concern   Not on file  Social History Narrative   Works for the county    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   Social Determinants of Health   Financial Resource Strain: Not on file  Food Insecurity: Not on file  Transportation Needs: Not on file  Physical Activity: Not on file  Stress: Not on file  Social Connections: Not on file  Intimate Partner Violence: Not on file     Constitutional: Denies fever, malaise, fatigue, headache or abrupt weight changes.  HEENT: Denies eye pain, eye redness, ear pain, ringing in the ears, wax buildup, runny nose, nasal congestion, bloody nose, or sore throat. Respiratory: Denies difficulty breathing, shortness of breath, cough or sputum production.   Cardiovascular: Denies chest pain, chest tightness, palpitations or swelling in the hands or feet.  Gastrointestinal: Pt reports intermittent reflux. Denies abdominal pain, bloating, constipation, diarrhea or blood in the stool.  GU: Patient reports erectile dysfunction.  Denies urgency, frequency, pain with urination, burning sensation, blood in urine, odor or discharge. Musculoskeletal: Patient reports chronic shoulder pain, left hip pain.  Denies decrease in range of motion, difficulty with gait,  muscle pain or joint swelling.  Skin: Denies redness, rashes, lesions or ulcercations.  Neurological: Pt reports paresthesia of right upper extremity. Denies dizziness, difficulty with memory, difficulty with speech or problems with balance and coordination.  Psych: Denies anxiety, depression, SI/HI.  No other specific complaints in a complete review of systems (except as listed in HPI above).  Objective:   Physical Exam  BP 112/78 (BP Location: Right Arm, Patient Position: Sitting, Cuff Size: Large)   Pulse 88   Temp (!) 96.6 F (35.9 C) (Temporal)   Ht 5\' 11"  (1.803 m)   Wt 233 lb (105.7 kg)   SpO2 97%   BMI 32.50 kg/m   Wt Readings from Last 3 Encounters:  03/27/22 234 lb (106.1 kg)  12/25/21 228 lb (103.4 kg)  08/01/21 234 lb (106.1 kg)    General: Appears his stated age, obese, in NAD. Skin: Warm, dry and intact. No ulcerations noted. HEENT: Head: normal shape and size; Eyes: sclera white, no icterus, conjunctiva pink, PERRLA and EOMs intact;  Neck:  Neck supple, trachea midline. No masses, lumps or thyromegaly present.  Cardiovascular: Normal rate and rhythm. S1,S2 noted.  No murmur, rubs or gallops noted. No JVD or BLE edema.  Pulmonary/Chest: Normal effort and positive vesicular breath sounds. No respiratory distress. No wheezes, rales or ronchi noted.  Abdomen: Normal bowel sounds.  Musculoskeletal: Strength 5/5 BUE/BLE.  Encourage diet next no difficulty with gait.  Neurological: Alert and oriented. Cranial nerves II-XII grossly intact. Coordination normal.  Psychiatric: Mood and affect normal. Behavior is normal. Judgment and thought content normal.     BMET    Component Value Date/Time   NA 137 03/27/2022 0824   NA 139 07/19/2019 1005   K 4.2 03/27/2022 0824   CL 104 03/27/2022 0824   CO2 21 03/27/2022 0824   GLUCOSE 178 (H) 03/27/2022 0824   BUN 19 03/27/2022 0824   BUN 15 07/19/2019 1005   CREATININE 0.98 03/27/2022 0824   CALCIUM 9.7 03/27/2022 0824    GFRNONAA 96 06/28/2020 0756   GFRAA 111 06/28/2020 0756    Lipid Panel     Component Value Date/Time   CHOL 155 03/27/2022 0824   CHOL 150 06/18/2020 1259   TRIG 230 (H) 03/27/2022 0824   HDL 35 (L) 03/27/2022 0824   HDL 29 (L) 06/18/2020 1259   CHOLHDL 4.4 03/27/2022 0824   LDLCALC 88 03/27/2022 0824    CBC    Component Value Date/Time   WBC 7.6 12/25/2021 0853   RBC 5.77 12/25/2021 0853   HGB 17.2 (H) 12/25/2021 0853   HGB 16.9 03/23/2018 0935   HCT 49.7 12/25/2021 0853   HCT 47.4 03/23/2018 0935   PLT 261 12/25/2021 0853   PLT 231 03/23/2018  0935   MCV 86.1 12/25/2021 0853   MCV 84 03/23/2018 0935   MCH 29.8 12/25/2021 0853   MCHC 34.6 12/25/2021 0853   RDW 13.9 12/25/2021 0853   RDW 13.7 03/23/2018 0935   LYMPHSABS 1.3 03/23/2018 0935   EOSABS 0.0 03/23/2018 0935   BASOSABS 0.0 03/23/2018 0935    Hgb A1C Lab Results  Component Value Date   HGBA1C 7.7 (H) 03/27/2022            Assessment & Plan:   Preventative Health Maintenance:  Encouraged him to get a flu shot in the fall Tetanus UTD Encouraged him to get his COVID booster Pneumovax UTD Encouraged him to consume a balanced diet and exercise regimen Advised him to see an eye doctor and dentist annually We will check CBC, c-Met, lipid, A1c today  RTC in 6 months, follow-up chronic conditions Nicki Reaper, NP

## 2022-06-26 NOTE — Telephone Encounter (Signed)
PA has been submitted to Cover My Meds.   Thanks,   -Deandrew Hoecker  

## 2022-06-26 NOTE — Telephone Encounter (Signed)
Requested medications are due for refill today.  See pharmacy note  Requested medications are on the active medications list.  yes  Last refill. 06/25/2022  - not filled  Future visit scheduled.   yes  Notes to clinic.  Pharmacy comment: Alternative Requested:NEED PRIOR AUTHORIZATION.    Requested Prescriptions  Pending Prescriptions Disp Refills   MOUNJARO 2.5 MG/0.5ML Pen [Pharmacy Med Name: MOUNJARO 2.5 MG/0.5 ML PEN]  0    Sig: INJECT 2.5 MG SUBCUTANEOUSLY WEEKLY     Off-Protocol Failed - 06/25/2022  7:59 PM      Failed - Medication not assigned to a protocol, review manually.      Passed - Valid encounter within last 12 months    Recent Outpatient Visits           Yesterday Encounter for general adult medical examination with abnormal findings   [place] [facility] [place], [place] W, NP   3 months ago Type 2 diabetes mellitus with hyperglycemia, without long-term current use of insulin [facility])   [place] [facility] [place], [place] W, NP   6 months ago Type 2 diabetes mellitus without complication, without long-term current use of insulin [facility])   [place] [facility] [place], Salvadore Oxford, NP   1 year ago Encounter for general adult medical examination with abnormal findings   [place] [facility] [place], Salvadore Oxford, NP   1 year ago Sore throat   [place] [facility] [place], Salvadore Oxford, NP       Future Appointments             In 6 months Baity, Salvadore Oxford, NP [facility], [facility]

## 2022-06-26 NOTE — Telephone Encounter (Signed)
Can you check on this.  He has been taking this not sure why it is asking for a prior Auth

## 2022-06-29 ENCOUNTER — Other Ambulatory Visit: Payer: Managed Care, Other (non HMO)

## 2022-06-29 DIAGNOSIS — E1165 Type 2 diabetes mellitus with hyperglycemia: Secondary | ICD-10-CM

## 2022-06-29 DIAGNOSIS — Z0001 Encounter for general adult medical examination with abnormal findings: Secondary | ICD-10-CM

## 2022-06-30 ENCOUNTER — Encounter: Payer: Self-pay | Admitting: Internal Medicine

## 2022-06-30 LAB — COMPLETE METABOLIC PANEL WITH GFR
AG Ratio: 2 (calc) (ref 1.0–2.5)
ALT: 30 U/L (ref 9–46)
AST: 20 U/L (ref 10–40)
Albumin: 4.7 g/dL (ref 3.6–5.1)
Alkaline phosphatase (APISO): 38 U/L (ref 36–130)
BUN: 18 mg/dL (ref 7–25)
CO2: 24 mmol/L (ref 20–32)
Calcium: 9.9 mg/dL (ref 8.6–10.3)
Chloride: 102 mmol/L (ref 98–110)
Creat: 1.09 mg/dL (ref 0.60–1.29)
Globulin: 2.4 g/dL (calc) (ref 1.9–3.7)
Glucose, Bld: 175 mg/dL — ABNORMAL HIGH (ref 65–99)
Potassium: 4.1 mmol/L (ref 3.5–5.3)
Sodium: 137 mmol/L (ref 135–146)
Total Bilirubin: 0.9 mg/dL (ref 0.2–1.2)
Total Protein: 7.1 g/dL (ref 6.1–8.1)
eGFR: 83 mL/min/{1.73_m2} (ref >=60)

## 2022-06-30 LAB — LIPID PANEL
Cholesterol: 194 mg/dL (ref <200)
HDL: 26 mg/dL — ABNORMAL LOW (ref >=40)
Non-HDL Cholesterol (Calc): 168 mg/dL (calc) — ABNORMAL HIGH (ref <130)
Total CHOL/HDL Ratio: 7.5 (calc) — ABNORMAL HIGH (ref <5.0)
Triglycerides: 631 mg/dL — ABNORMAL HIGH (ref <150)

## 2022-06-30 LAB — CBC
HCT: 48.4 % (ref 38.5–50.0)
Hemoglobin: 16.4 g/dL (ref 13.2–17.1)
MCH: 29.4 pg (ref 27.0–33.0)
MCHC: 33.9 g/dL (ref 32.0–36.0)
MCV: 86.7 fL (ref 80.0–100.0)
MPV: 9.2 fL (ref 7.5–12.5)
Platelets: 244 10*3/uL (ref 140–400)
RBC: 5.58 10*6/uL (ref 4.20–5.80)
RDW: 13.3 % (ref 11.0–15.0)
WBC: 6.3 10*3/uL (ref 3.8–10.8)

## 2022-06-30 LAB — HEMOGLOBIN A1C
Hgb A1c MFr Bld: 7.2 % of total Hgb — ABNORMAL HIGH (ref <5.7)
Mean Plasma Glucose: 160 mg/dL
eAG (mmol/L): 8.9 mmol/L

## 2022-06-30 LAB — MICROALBUMIN / CREATININE URINE RATIO
Creatinine, Urine: 71 mg/dL (ref 20–320)
Microalb, Ur: <0.2 mg/dL

## 2022-06-30 NOTE — Telephone Encounter (Signed)
Pt's Jerry Reid has been approved.   Thanks,   -Vernona Rieger

## 2022-07-06 ENCOUNTER — Encounter: Payer: Self-pay | Admitting: Internal Medicine

## 2022-07-06 NOTE — Telephone Encounter (Signed)
He has been out and needs to restart at the 2.5 but will go up to 5 mg after the first month.  Have we done a prior Auth on this?

## 2022-07-13 ENCOUNTER — Encounter: Payer: Self-pay | Admitting: Internal Medicine

## 2022-07-15 ENCOUNTER — Encounter: Payer: Self-pay | Admitting: Internal Medicine

## 2022-07-15 NOTE — Telephone Encounter (Signed)
See My Chart Message.

## 2022-07-16 MED ORDER — TIRZEPATIDE 5 MG/0.5ML ~~LOC~~ SOAJ: 5.0000 mg | SUBCUTANEOUS | 1 refills | Status: DC

## 2022-07-19 ENCOUNTER — Other Ambulatory Visit: Payer: Self-pay | Admitting: Internal Medicine

## 2022-07-21 NOTE — Telephone Encounter (Signed)
Requested Prescriptions  Pending Prescriptions Disp Refills   glipiZIDE (GLUCOTROL XL) 10 MG 24 hr tablet [Pharmacy Med Name: GLIPIZIDE ER 10 MG TABLET] 90 tablet 1    Sig: TAKE 1 TABLET (10 MG TOTAL) BY MOUTH DAILY WITH BREAKFAST.     Endocrinology:  Diabetes - Sulfonylureas Passed - 07/19/2022  1:21 AM      Passed - HBA1C is between 0 and 7.9 and within 180 days    Hgb A1c MFr Bld  Date Value Ref Range Status  06/29/2022 7.2 (H) <5.7 % of total Hgb Final    Comment:    For someone without known diabetes, a hemoglobin A1c value of 6.5% or greater indicates that they may have  diabetes and this should be confirmed with a follow-up  test. . For someone with known diabetes, a value <7% indicates  that their diabetes is well controlled and a value  greater than or equal to 7% indicates suboptimal  control. A1c targets should be individualized based on  duration of diabetes, age, comorbid conditions, and  other considerations. . Currently, no consensus exists regarding use of hemoglobin A1c for diagnosis of diabetes for children. .          Passed - Cr in normal range and within 360 days    Creat  Date Value Ref Range Status  06/29/2022 1.09 0.60 - 1.29 mg/dL Final   Creatinine, Urine  Date Value Ref Range Status  06/29/2022 71 20 - 320 mg/dL Final         Passed - Valid encounter within last 6 months    Recent Outpatient Visits           3 weeks ago Encounter for general adult medical examination with abnormal findings   [place] [facility] [place], [place] W, NP   3 months ago Type 2 diabetes mellitus with hyperglycemia, without long-term current use of insulin [facility])   [facility] [place], [place] W, NP   6 months ago Type 2 diabetes mellitus without complication, without long-term current use of insulin [facility])   [place] [facility] [place], Salvadore Oxford, NP   1 year ago Encounter for general adult medical  examination with abnormal findings   [place] [facility] [place], Salvadore Oxford, NP   1 year ago Sore throat   [place] [facility] [place], Salvadore Oxford, NP       Future Appointments             In 5 months [place], Salvadore Oxford, NP [place] [facility], [facility]             fenofibrate (TRICOR) 145 MG tablet [Pharmacy Med Name: FENOFIBRATE 145 MG TABLET] 90 tablet 1    Sig: TAKE 1 TABLET BY MOUTH EVERY DAY     Cardiovascular:  Antilipid - Fibric Acid Derivatives Failed - 07/19/2022  1:21 AM      Failed - Lipid Panel in normal range within the last 12 months    Cholesterol, Total  Date Value Ref Range Status  06/18/2020 150 100 - 199 mg/dL Final   Cholesterol  Date Value Ref Range Status  06/29/2022 194 <200 mg/dL Final   LDL Cholesterol (Calc)  Date Value Ref Range Status  06/29/2022  mg/dL (calc) Final    Comment:    . LDL cholesterol not calculated. Triglyceride levels greater than 400 mg/dL invalidate calculated LDL results. . Reference range: <  100 . Desirable range <100 mg/dL for primary prevention;   <70 mg/dL for patients with CHD or diabetic patients  with > or = 2 CHD risk factors. Marland Kitchen LDL-C is now calculated using the Martin-Hopkins  calculation, which is a validated novel method providing  better accuracy than the Friedewald equation in the  estimation of LDL-C.  Horald Pollen et al. Lenox Ahr. 1610;960(45): 2061-2068  (http://education.[employer].com/faq/FAQ164)    Direct LDL  Date Value Ref Range Status  01/04/2020 119.0 mg/dL Final    Comment:    Optimal:  <100 mg/dLNear or Above Optimal:  100-129 mg/dLBorderline High:  130-159 mg/dLHigh:  160-189 mg/dLVery High:  >190 mg/dL   HDL  Date Value Ref Range Status  06/29/2022 26 (L) > OR = 40 mg/dL Final  40/98/1191 29 (L) >39 mg/dL Final   Triglycerides  Date Value Ref Range Status  06/29/2022 631 (H) <150 mg/dL Final    Comment:    . If a non-fasting specimen  was collected, consider repeat triglyceride testing on a fasting specimen if clinically indicated.  Perry Mount et al. J. of Clin. Lipidol. 2015;9:129-169. . . There is increased risk of pancreatitis when the  triglyceride concentration is very high  (> or = 500 mg/dL, especially if > or = 4782 mg/dL).  Perry Mount et al. J. of Clin. Lipidol. 2015;9:129-169. Marland Kitchen          Passed - ALT in normal range and within 360 days    ALT  Date Value Ref Range Status  06/29/2022 30 9 - 46 U/L Final         Passed - AST in normal range and within 360 days    AST  Date Value Ref Range Status  06/29/2022 20 10 - 40 U/L Final         Passed - Cr in normal range and within 360 days    Creat  Date Value Ref Range Status  06/29/2022 1.09 0.60 - 1.29 mg/dL Final   Creatinine, Urine  Date Value Ref Range Status  06/29/2022 71 20 - 320 mg/dL Final         Passed - HGB in normal range and within 360 days    Hemoglobin  Date Value Ref Range Status  06/29/2022 16.4 13.2 - 17.1 g/dL Final  95/62/1308 65.7 13.0 - 17.7 g/dL Final         Passed - HCT in normal range and within 360 days    HCT  Date Value Ref Range Status  06/29/2022 48.4 38.5 - 50.0 % Final   Hematocrit  Date Value Ref Range Status  03/23/2018 47.4 37.5 - 51.0 % Final         Passed - PLT in normal range and within 360 days    Platelets  Date Value Ref Range Status  06/29/2022 244 140 - 400 Thousand/uL Final  03/23/2018 231 150 - 450 x10E3/uL Final         Passed - WBC in normal range and within 360 days    WBC  Date Value Ref Range Status  06/29/2022 6.3 3.8 - 10.8 Thousand/uL Final         Passed - eGFR is 30 or above and within 360 days    GFR, Est African American  Date Value Ref Range Status  06/28/2020 111 > OR = 60 mL/min/1.47m2 Final   GFR, Est Non African American  Date Value Ref Range Status  06/28/2020 96 > OR = 60 mL/min/1.88m2 Final   GFR  Date Value Ref Range Status  11/17/2018 80.55 >60.00  mL/min Final   eGFR  Date Value Ref Range Status  06/29/2022 83 > OR = 60 mL/min/1.79m2 Final         Passed - Valid encounter within last 12 months    Recent Outpatient Visits           3 weeks ago Encounter for general adult medical examination with abnormal findings   [place] [facility] [place], [place], NP   3 months ago Type 2 diabetes mellitus with hyperglycemia, without long-term current use of insulin [facility])   [place] [facility] [place], [place], NP   6 months ago Type 2 diabetes mellitus without complication, without long-term current use of insulin [facility])   [place] [facility] [place], Salvadore Oxford, NP   1 year ago Encounter for general adult medical examination with abnormal findings   [place] [facility] [place], Salvadore Oxford, NP   1 year ago Sore throat   [place] [facility] [place], Salvadore Oxford, NP       Future Appointments             In 5 months Baity, Salvadore Oxford, NP [place] [facility], [facility]

## 2022-09-29 ENCOUNTER — Other Ambulatory Visit: Payer: Self-pay | Admitting: Internal Medicine

## 2022-09-30 NOTE — Telephone Encounter (Signed)
Requested Prescriptions  Pending Prescriptions Disp Refills   lisinopril (ZESTRIL) 10 MG tablet [Pharmacy Med Name: LISINOPRIL 10 MG TABLET] 90 tablet 1    Sig: TAKE 1 TABLET BY MOUTH EVERY DAY     Cardiovascular:  ACE Inhibitors Passed - 09/29/2022  1:39 AM      Passed - Cr in normal range and within 180 days    Creat  Date Value Ref Range Status  06/29/2022 1.09 0.60 - 1.29 mg/dL Final   Creatinine, Urine  Date Value Ref Range Status  06/29/2022 71 20 - 320 mg/dL Final         Passed - K in normal range and within 180 days    Potassium  Date Value Ref Range Status  06/29/2022 4.1 3.5 - 5.3 mmol/L Final         Passed - Patient is not pregnant      Passed - Last BP in normal range    BP Readings from Last 1 Encounters:  06/25/22 112/78         Passed - Valid encounter within last 6 months    Recent Outpatient Visits           3 months ago Encounter for general adult medical examination with abnormal findings   [facility] [place], [place] W, NP   6 months ago Type 2 diabetes mellitus with hyperglycemia, without long-term current use of insulin [facility])   [facility] [place], [place] W, NP   9 months ago Type 2 diabetes mellitus without complication, without long-term current use of insulin [facility])   [facility] [place], Salvadore Oxford, NP   1 year ago Encounter for general adult medical examination with abnormal findings   [facility] [place], Salvadore Oxford, NP   1 year ago Sore throat   [place] [facility] [place], Salvadore Oxford, NP       Future Appointments             In 3 months Baity, Salvadore Oxford, NP [place], [facility]

## 2022-11-02 ENCOUNTER — Encounter: Payer: Self-pay | Admitting: Internal Medicine

## 2022-11-25 ENCOUNTER — Other Ambulatory Visit: Payer: Self-pay | Admitting: Internal Medicine

## 2022-11-25 NOTE — Telephone Encounter (Signed)
Requested Prescriptions  Pending Prescriptions Disp Refills   JARDIANCE 25 MG TABS tablet [Pharmacy Med Name: JARDIANCE 25 MG TABLET] 90 tablet 1    Sig: TAKE 1 TABLET (25 MG TOTAL) BY MOUTH DAILY.     Endocrinology:  Diabetes - SGLT2 Inhibitors Passed - 11/25/2022  1:27 AM      Passed - Cr in normal range and within 360 days    Creat  Date Value Ref Range Status  06/29/2022 1.09 0.60 - 1.29 mg/dL Final   Creatinine, Urine  Date Value Ref Range Status  06/29/2022 71 20 - 320 mg/dL Final         Passed - HBA1C is between 0 and 7.9 and within 180 days    Hgb A1c MFr Bld  Date Value Ref Range Status  06/29/2022 7.2 (H) <5.7 % of total Hgb Final    Comment:    For someone without known diabetes, a hemoglobin A1c value of 6.5% or greater indicates that they may have  diabetes and this should be confirmed with a follow-up  test. . For someone with known diabetes, a value <7% indicates  that their diabetes is well controlled and a value  greater than or equal to 7% indicates suboptimal  control. A1c targets should be individualized based on  duration of diabetes, age, comorbid conditions, and  other considerations. . Currently, no consensus exists regarding use of hemoglobin A1c for diagnosis of diabetes for children. .          Passed - eGFR in normal range and within 360 days    GFR, Est African American  Date Value Ref Range Status  06/28/2020 111 > OR = 60 mL/min/1.83m2 Final   GFR, Est Non African American  Date Value Ref Range Status  06/28/2020 96 > OR = 60 mL/min/1.69m2 Final   GFR  Date Value Ref Range Status  11/17/2018 80.55 >60.00 mL/min Final   eGFR  Date Value Ref Range Status  06/29/2022 83 > OR = 60 mL/min/1.1m2 Final         Passed - Valid encounter within last 6 months    Recent Outpatient Visits           5 months ago Encounter for general adult medical examination with abnormal findings   [facility] [place], [place]  W, NP   8 months ago Type 2 diabetes mellitus with hyperglycemia, without long-term current use of insulin [facility])   [place] [place], [place] W, NP   11 months ago Type 2 diabetes mellitus without complication, without long-term current use of insulin [facility])   [place] [facility] [place], Salvadore Oxford, NP   1 year ago Encounter for general adult medical examination with abnormal findings   [facility] [place], Salvadore Oxford, NP   1 year ago Sore throat   [facility] [place], Salvadore Oxford, NP       Future Appointments             In 1 month McIntyre, Salvadore Oxford, NP [place] [facility], [facility]

## 2022-12-30 ENCOUNTER — Ambulatory Visit: Payer: Managed Care, Other (non HMO) | Admitting: Internal Medicine

## 2022-12-30 ENCOUNTER — Encounter: Payer: Self-pay | Admitting: Internal Medicine

## 2022-12-30 VITALS — BP 118/74 | Ht 71.0 in | Wt 216.0 lb

## 2022-12-30 DIAGNOSIS — E1169 Type 2 diabetes mellitus with other specified complication: Secondary | ICD-10-CM

## 2022-12-30 DIAGNOSIS — Z23 Encounter for immunization: Secondary | ICD-10-CM | POA: Diagnosis not present

## 2022-12-30 DIAGNOSIS — E6609 Other obesity due to excess calories: Secondary | ICD-10-CM

## 2022-12-30 DIAGNOSIS — E66811 Obesity, class 1: Secondary | ICD-10-CM

## 2022-12-30 DIAGNOSIS — E1165 Type 2 diabetes mellitus with hyperglycemia: Secondary | ICD-10-CM

## 2022-12-30 DIAGNOSIS — N522 Drug-induced erectile dysfunction: Secondary | ICD-10-CM

## 2022-12-30 DIAGNOSIS — M25511 Pain in right shoulder: Secondary | ICD-10-CM

## 2022-12-30 DIAGNOSIS — Z6830 Body mass index (BMI) 30.0-30.9, adult: Secondary | ICD-10-CM

## 2022-12-30 DIAGNOSIS — I10 Essential (primary) hypertension: Secondary | ICD-10-CM

## 2022-12-30 DIAGNOSIS — G8929 Other chronic pain: Secondary | ICD-10-CM

## 2022-12-30 DIAGNOSIS — E785 Hyperlipidemia, unspecified: Secondary | ICD-10-CM

## 2022-12-30 NOTE — Patient Instructions (Signed)

## 2022-12-30 NOTE — Assessment & Plan Note (Signed)
-   Continue sildenafil °

## 2022-12-30 NOTE — Assessment & Plan Note (Signed)
A1c today Urine microalbumin has been checked within the last year Encouraged her to consume a low-carb diet and exercise for weight loss Continue metformin, glipizide and Jardiance but consider reduction in oral medication pending A1c Encouraged routine eye exam Encouraged routine foot exam Flu shot today Pneumovax UTD Encouraged him to get his COVID booster

## 2022-12-30 NOTE — Assessment & Plan Note (Signed)
-  Resolved.  Will monitor

## 2022-12-30 NOTE — Assessment & Plan Note (Signed)
Controlled on lisinopril Reinforced DASH diet and exercise for weight loss C-Met today 

## 2022-12-30 NOTE — Assessment & Plan Note (Signed)
Encouraged diet and exercise for weight loss ?

## 2022-12-30 NOTE — Assessment & Plan Note (Signed)
C-Met and lipid profile today Encouraged him to consume a low-fat diet Continue rosuvastatin and fenofibrate 

## 2022-12-30 NOTE — Progress Notes (Signed)
Subjective:    Patient ID: Jerry Reid, male    DOB: 08-May-1975, 47 y.o.   MRN: [id number]  HPI  Patient presents to clinic today for 67-month follow-up of chronic conditions.  DM2: His last A1c was 7.2%, 06/2022.  He is taking mounjaro, glipizide and jardiance as prescribed.  He does not check his sugars.  He checks his feet routinely.  His last eye exam was 07/2020, scheduled next week.  Flu 12/2021.  Pneumovax 12/2021.  COVID Pfizer x 2.  HTN: His BP today is 118/74.  He is taking lisinopril as prescribed.  ECG from 07/2010 reviewed.  HLD: His last LDL was not calculated, triglycerides 631, 06/2022.  He is taking rosuvastatin and fenofibrate as prescribed.  He does not consume low-fat diet.  OA, right shoulder: Pain seems to have resolved. He takes tylenol or ibuprofen  as needed with some relief of symptoms.  He follows with orthopedics.  ED: He is having difficulty maintaining erections. He is taking sildenafil as prescribed.  He does not currently follow with urology.  Review of Systems     Past Medical History:  Diagnosis Date   Chest pain    Chicken pox    Cholelithiasis    Diabetes mellitus without complication (HCC)    Dysplastic nevus    sees Dr. Adolphus Birchwood PAC [place]    HTN (hypertension)    Hyperlipidemia    Kidney stones     Current Outpatient Medications  Medication Sig Dispense Refill   Blood Glucose Monitoring Suppl (ONETOUCH VERIO) w/Device KIT 1 Device by Does not apply route daily. 1 kit 0   fenofibrate (TRICOR) 145 MG tablet TAKE 1 TABLET BY MOUTH EVERY DAY 90 tablet 1   glipiZIDE (GLUCOTROL XL) 10 MG 24 hr tablet TAKE 1 TABLET (10 MG TOTAL) BY MOUTH DAILY WITH BREAKFAST. 90 tablet 1   JARDIANCE 25 MG TABS tablet TAKE 1 TABLET (25 MG TOTAL) BY MOUTH DAILY. 90 tablet 1   Lancets (ONETOUCH ULTRASOFT) lancets Use as instructed 100 each 12   lisinopril (ZESTRIL) 10 MG tablet TAKE 1 TABLET BY MOUTH EVERY DAY 90 tablet 1   ONETOUCH VERIO test strip 1 EACH BY  OTHER ROUTE IN THE MORNING AND AT BEDTIME. USE AS INSTRUCTED 100 strip 12   rosuvastatin (CRESTOR) 10 MG tablet Take 1 tablet (10 mg total) by mouth daily. 90 tablet 1   sildenafil (VIAGRA) 50 MG tablet Take 1 tablet (50 mg total) by mouth daily as needed for erectile dysfunction. 10 tablet 5   tirzepatide (MOUNJARO) 5 MG/0.5ML Pen Inject 5 mg into the skin once a week. 6 mL 1   No current facility-administered medications for this visit.    Allergies  Allergen Reactions   Metformin And Related Diarrhea    Family History  Problem Relation Age of Onset   Heart attack Mother    Heart disease Mother    Hyperlipidemia Mother    Hypertension Mother    Diabetes Mother    Hypertension Father    Heart disease Father    Hyperlipidemia Father    Diabetes Father    Prostate cancer Neg Hx    Colon cancer Neg Hx     Social History   Socioeconomic History   Marital status: Married    Spouse name: Not on file   Number of children: Not on file   Years of education: Not on file   Highest education level: Not on file  Occupational History   Not on  file  Tobacco Use   Smoking status: Never   Smokeless tobacco: Never  Vaping Use   Vaping status: Never Used  Substance and Sexual Activity   Alcohol use: No   Drug use: No   Sexual activity: Not on file  Other Topics Concern   Not on file  Social History Narrative   Works for the county    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   Social Determinants of Health   Financial Resource Strain: Not on file  Food Insecurity: Not on file  Transportation Needs: Not on file  Physical Activity: Not on file  Stress: Not on file  Social Connections: Not on file  Intimate Partner Violence: Not on file     Constitutional: Denies fever, malaise, fatigue, headache or abrupt weight changes.  HEENT: Denies eye pain, eye redness, ear pain, ringing in the ears, wax buildup, runny nose, nasal congestion, bloody nose, or sore throat. Respiratory:  Denies difficulty breathing, shortness of breath, cough or sputum production.   Cardiovascular: Denies chest pain, chest tightness, palpitations or swelling in the hands or feet.  Gastrointestinal: Denies abdominal pain, bloating, constipation, diarrhea or blood in the stool.  GU: Patient reports erectile dysfunction.  Denies urgency, frequency, pain with urination, burning sensation, blood in urine, odor or discharge. Musculoskeletal: Denies difficulty with gait, muscle pain or joint pain and swelling.  Skin: Denies redness, rashes, lesions or ulcercations.  Neurological: Denies dizziness, difficulty with memory, difficulty with speech or problems with balance and coordination.  Psych: Denies anxiety, depression, SI/HI.  No other specific complaints in a complete review of systems (except as listed in HPI above).  Objective:   Physical Exam  BP 118/74   Ht 5\' 11"  (1.803 m)   Wt 216 lb (98 kg)   BMI 30.13 kg/m    Wt Readings from Last 3 Encounters:  06/25/22 233 lb (105.7 kg)  03/27/22 234 lb (106.1 kg)  12/25/21 228 lb (103.4 kg)    General: Appears his stated age, obese in NAD. Skin: Warm, dry and intact. No rashes noted. HEENT: Head: normal shape and size; Eyes: sclera white, no icterus, conjunctiva pink, PERRLA and EOMs intact;  Cardiovascular: Normal rate and rhythm. S1,S2 noted.  No murmur, rubs or gallops noted. No JVD or BLE edema. No carotid bruits noted. Pulmonary/Chest: Normal effort and positive vesicular breath sounds. No respiratory distress. No wheezes, rales or ronchi noted.  Musculoskeletal: No difficulty with gait.  Neurological: Alert and oriented. Cranial nerves II-XII grossly intact. Coordination normal.  Psychiatric: Mood and affect normal. Behavior is normal. Judgment and thought content normal.    BMET    Component Value Date/Time   NA 137 06/29/2022 0813   NA 139 07/19/2019 1005   K 4.1 06/29/2022 0813   CL 102 06/29/2022 0813   CO2 24 06/29/2022  0813   GLUCOSE 175 (H) 06/29/2022 0813   BUN 18 06/29/2022 0813   BUN 15 07/19/2019 1005   CREATININE 1.09 06/29/2022 0813   CALCIUM 9.9 06/29/2022 0813   GFRNONAA 96 06/28/2020 0756   GFRAA 111 06/28/2020 0756    Lipid Panel     Component Value Date/Time   CHOL 194 06/29/2022 0813   CHOL 150 06/18/2020 1259   TRIG 631 (H) 06/29/2022 0813   HDL 26 (L) 06/29/2022 0813   HDL 29 (L) 06/18/2020 1259   CHOLHDL 7.5 (H) 06/29/2022 0813   LDLCALC  06/29/2022 0813     Comment:     . LDL cholesterol  not calculated. Triglyceride levels greater than 400 mg/dL invalidate calculated LDL results. . Reference range: <100 . Desirable range <100 mg/dL for primary prevention;   <70 mg/dL for patients with CHD or diabetic patients  with > or = 2 CHD risk factors. Marland Kitchen LDL-C is now calculated using the Martin-Hopkins  calculation, which is a validated novel method providing  better accuracy than the Friedewald equation in the  estimation of LDL-C.  Horald Pollen et al. Lenox Ahr. 6213;086(57): 2061-2068  (http://education.[facility].com/faq/FAQ164)     CBC    Component Value Date/Time   WBC 6.3 06/29/2022 0813   RBC 5.58 06/29/2022 0813   HGB 16.4 06/29/2022 0813   HGB 16.9 03/23/2018 0935   HCT 48.4 06/29/2022 0813   HCT 47.4 03/23/2018 0935   PLT 244 06/29/2022 0813   PLT 231 03/23/2018 0935   MCV 86.7 06/29/2022 0813   MCV 84 03/23/2018 0935   MCH 29.4 06/29/2022 0813   MCHC 33.9 06/29/2022 0813   RDW 13.3 06/29/2022 0813   RDW 13.7 03/23/2018 0935   LYMPHSABS 1.3 03/23/2018 0935   EOSABS 0.0 03/23/2018 0935   BASOSABS 0.0 03/23/2018 0935    Hgb A1C Lab Results  Component Value Date   HGBA1C 7.2 (H) 06/29/2022            Assessment & Plan:   RTC in 6 months for annual exam Nicki Reaper, NP

## 2022-12-31 ENCOUNTER — Encounter: Payer: Self-pay | Admitting: Internal Medicine

## 2022-12-31 LAB — CBC
HCT: 50.3 % — ABNORMAL HIGH (ref 38.5–50.0)
Hemoglobin: 17.1 g/dL (ref 13.2–17.1)
MCH: 29.3 pg (ref 27.0–33.0)
MCHC: 34 g/dL (ref 32.0–36.0)
MCV: 86.3 fL (ref 80.0–100.0)
MPV: 9.7 fL (ref 7.5–12.5)
Platelets: 275 Thousand/uL (ref 140–400)
RBC: 5.83 Million/uL — ABNORMAL HIGH (ref 4.20–5.80)
RDW: 13 % (ref 11.0–15.0)
WBC: 6.3 Thousand/uL (ref 3.8–10.8)

## 2022-12-31 LAB — COMPLETE METABOLIC PANEL WITHOUT GFR
AG Ratio: 1.8 (calc) (ref 1.0–2.5)
ALT: 27 U/L (ref 9–46)
AST: 21 U/L (ref 10–40)
Albumin: 4.9 g/dL (ref 3.6–5.1)
Alkaline phosphatase (APISO): 34 U/L — ABNORMAL LOW (ref 36–130)
BUN: 14 mg/dL (ref 7–25)
CO2: 23 mmol/L (ref 20–32)
Calcium: 9.7 mg/dL (ref 8.6–10.3)
Chloride: 106 mmol/L (ref 98–110)
Creat: 1.12 mg/dL (ref 0.60–1.29)
Globulin: 2.7 g/dL (ref 1.9–3.7)
Glucose, Bld: 124 mg/dL — ABNORMAL HIGH (ref 65–99)
Potassium: 4.3 mmol/L (ref 3.5–5.3)
Sodium: 138 mmol/L (ref 135–146)
Total Bilirubin: 1.2 mg/dL (ref 0.2–1.2)
Total Protein: 7.6 g/dL (ref 6.1–8.1)
eGFR: 81 mL/min/1.73m2

## 2022-12-31 LAB — LIPID PANEL
Cholesterol: 119 mg/dL
HDL: 37 mg/dL — ABNORMAL LOW
LDL Cholesterol (Calc): 60 mg/dL
Non-HDL Cholesterol (Calc): 82 mg/dL
Total CHOL/HDL Ratio: 3.2 (calc)
Triglycerides: 136 mg/dL

## 2022-12-31 LAB — HEMOGLOBIN A1C
Hgb A1c MFr Bld: 5.4 %{Hb}
Mean Plasma Glucose: 108 mg/dL
eAG (mmol/L): 6 mmol/L

## 2022-12-31 MED ORDER — ONETOUCH VERIO VI STRP: 1.0000 | ORAL_STRIP | Freq: Two times a day (BID) | 12 refills | Status: DC

## 2023-01-06 LAB — HM DIABETES EYE EXAM

## 2023-01-10 ENCOUNTER — Other Ambulatory Visit: Payer: Self-pay | Admitting: Internal Medicine

## 2023-01-12 NOTE — Telephone Encounter (Signed)
Requested medication (s) are due for refill today - yes  Requested medication (s) are on the active medication list -yes  Future visit scheduled -yes  Last refill: 07/16/22 6ml 1RF  Notes to clinic: off protocol- provider review    Requested Prescriptions  Pending Prescriptions Disp Refills   MOUNJARO 5 MG/0.5ML Pen [Pharmacy Med Name: MOUNJARO 5 MG/0.5 ML PEN]  1    Sig: INJECT 5 MG SUBCUTANEOUSLY WEEKLY     Off-Protocol Failed - 01/12/2023  8:01 AM      Failed - Medication not assigned to a protocol, review manually.      Passed - Valid encounter within last 12 months    Recent Outpatient Visits           1 week ago Type 2 diabetes mellitus with hyperglycemia, without long-term current use of insulin [facility])   [place] [facility] [place], Salvadore Oxford, NP   6 months ago Encounter for general adult medical examination with abnormal findings   [place] [facility] [place], [place] W, NP   9 months ago Type 2 diabetes mellitus with hyperglycemia, without long-term current use of insulin [facility])   [facility] [place], [place] W, NP   1 year ago Type 2 diabetes mellitus without complication, without long-term current use of insulin [facility])   [place] [place], Salvadore Oxford, NP   1 year ago Encounter for general adult medical examination with abnormal findings   [place] [facility] [place], Salvadore Oxford, NP       Future Appointments             In 5 months Baity, Salvadore Oxford, NP [place] [facility], [facility]               Requested Prescriptions  Pending Prescriptions Disp Refills   MOUNJARO 5 MG/0.5ML Pen [Pharmacy Med Name: MOUNJARO 5 MG/0.5 ML PEN]  1    Sig: INJECT 5 MG SUBCUTANEOUSLY WEEKLY     Off-Protocol Failed - 01/12/2023  8:01 AM      Failed - Medication not assigned to a protocol, review manually.      Passed - Valid encounter within last 12 months     Recent Outpatient Visits           1 week ago Type 2 diabetes mellitus with hyperglycemia, without long-term current use of insulin [facility])   [place] [facility] [place], Salvadore Oxford, NP   6 months ago Encounter for general adult medical examination with abnormal findings   [place] [facility] [place], [place], NP   9 months ago Type 2 diabetes mellitus with hyperglycemia, without long-term current use of insulin [facility])   [place] [place], [place] W, NP   1 year ago Type 2 diabetes mellitus without complication, without long-term current use of insulin [facility])   [place] [facility] [place], Salvadore Oxford, NP   1 year ago Encounter for general adult medical examination with abnormal findings   [place] [facility] [place], Salvadore Oxford, NP       Future Appointments             In 5 months Baity, Salvadore Oxford, NP [place] [facility]

## 2023-01-15 ENCOUNTER — Other Ambulatory Visit: Payer: Self-pay | Admitting: Internal Medicine

## 2023-01-19 NOTE — Telephone Encounter (Signed)
 Requested Prescriptions  Pending Prescriptions Disp Refills   glipiZIDE  (GLUCOTROL  XL) 10 MG 24 hr tablet [Pharmacy Med Name: GLIPIZIDE  ER 10 MG TABLET] 90 tablet 1    Sig: TAKE 1 TABLET (10 MG TOTAL) BY MOUTH DAILY WITH BREAKFAST.     Endocrinology:  Diabetes - Sulfonylureas Passed - 01/19/2023  3:50 PM      Passed - HBA1C is between 0 and 7.9 and within 180 days    Hgb A1c MFr Bld  Date Value Ref Range Status  12/30/2022 5.4 <5.7 % of total Hgb Final    Comment:    For the purpose of screening for the presence of diabetes: . <5.7%       Consistent with the absence of diabetes 5.7-6.4%    Consistent with increased risk for diabetes             (prediabetes) > or =6.5%  Consistent with diabetes . This assay result is consistent with a decreased risk of diabetes. . Currently, no consensus exists regarding use of hemoglobin A1c for diagnosis of diabetes in children. . According to American Diabetes Association (ADA) guidelines, hemoglobin A1c <7.0% represents optimal control in non-pregnant diabetic patients. Different metrics may apply to specific patient populations.  Standards of Medical Care in Diabetes(ADA). .          Passed - Cr in normal range and within 360 days    Creat  Date Value Ref Range Status  12/30/2022 1.12 0.60 - 1.29 mg/dL Final   Creatinine, Urine  Date Value Ref Range Status  06/29/2022 71 20 - 320 mg/dL Final         Passed - Valid encounter within last 6 months    Recent Outpatient Visits           2 weeks ago Type 2 diabetes mellitus with hyperglycemia, without long-term current use of insulin [facility])   [place] [facility], Angeline ORN, NP   6 months ago Encounter for general adult medical examination with abnormal findings   [place] [facility] [place], [place] W, NP   9 months ago Type 2 diabetes mellitus with hyperglycemia, without long-term current use of insulin [facility])   [place] [facility] [place], [place] W, NP   1 year ago Type 2 diabetes mellitus without complication, without long-term current use of insulin [facility])   [place] [place], Angeline ORN, NP   1 year ago Encounter for general adult medical examination with abnormal findings   [facility] [place], Angeline ORN, NP       Future Appointments             In 5 months [place], Angeline ORN, NP [facility]             fenofibrate  (TRICOR ) 145 MG tablet [Pharmacy Med Name: FENOFIBRATE  145 MG TABLET] 90 tablet 1    Sig: TAKE 1 TABLET BY MOUTH EVERY DAY     Cardiovascular:  Antilipid - Fibric Acid Derivatives Failed - 01/19/2023  3:50 PM      Failed - HCT in normal range and within 360 days    HCT  Date Value Ref Range Status  12/30/2022 50.3 (H) 38.5 - 50.0 % Final   Hematocrit  Date Value Ref Range Status  03/23/2018 47.4 37.5 - 51.0 % Final         Failed - Lipid Panel  in normal range within the last 12 months    Cholesterol, Total  Date Value Ref Range Status  06/18/2020 150 100 - 199 mg/dL Final   Cholesterol  Date Value Ref Range Status  12/30/2022 119 <200 mg/dL Final   LDL Cholesterol (Calc)  Date Value Ref Range Status  12/30/2022 60 mg/dL (calc) Final    Comment:    Reference range: <100 . Desirable range <100 mg/dL for primary prevention;   <70 mg/dL for patients with CHD or diabetic patients  with > or = 2 CHD risk factors. SABRA LDL-C is now calculated using the Martin-Hopkins  calculation, which is a validated novel method providing  better accuracy than the Friedewald equation in the  estimation of LDL-C.  Gladis APPLETHWAITE et al. SANDREA. 7986;689(80): 2061-2068  (http://education.[facility].com/faq/FAQ164)    Direct LDL  Date Value Ref Range Status  01/04/2020 119.0 mg/dL Final    Comment:    Optimal:  <100 mg/dLNear or Above Optimal:  100-129 mg/dLBorderline High:  130-159 mg/dLHigh:  160-189  mg/dLVery High:  >190 mg/dL   HDL  Date Value Ref Range Status  12/30/2022 37 (L) > OR = 40 mg/dL Final  94/68/7977 29 (L) >39 mg/dL Final   Triglycerides  Date Value Ref Range Status  12/30/2022 136 <150 mg/dL Final         Passed - ALT in normal range and within 360 days    ALT  Date Value Ref Range Status  12/30/2022 27 9 - 46 U/L Final         Passed - AST in normal range and within 360 days    AST  Date Value Ref Range Status  12/30/2022 21 10 - 40 U/L Final         Passed - Cr in normal range and within 360 days    Creat  Date Value Ref Range Status  12/30/2022 1.12 0.60 - 1.29 mg/dL Final   Creatinine, Urine  Date Value Ref Range Status  06/29/2022 71 20 - 320 mg/dL Final         Passed - HGB in normal range and within 360 days    Hemoglobin  Date Value Ref Range Status  12/30/2022 17.1 13.2 - 17.1 g/dL Final  96/95/7979 83.0 13.0 - 17.7 g/dL Final         Passed - PLT in normal range and within 360 days    Platelets  Date Value Ref Range Status  12/30/2022 275 140 - 400 Thousand/uL Final  03/23/2018 231 150 - 450 x10E3/uL Final         Passed - WBC in normal range and within 360 days    WBC  Date Value Ref Range Status  12/30/2022 6.3 3.8 - 10.8 Thousand/uL Final         Passed - eGFR is 30 or above and within 360 days    GFR, Est African American  Date Value Ref Range Status  06/28/2020 111 > OR = 60 mL/min/1.56m2 Final   GFR, Est Non African American  Date Value Ref Range Status  06/28/2020 96 > OR = 60 mL/min/1.19m2 Final   GFR  Date Value Ref Range Status  11/17/2018 80.55 >60.00 mL/min Final   eGFR  Date Value Ref Range Status  12/30/2022 81 > OR = 60 mL/min/1.50m2 Final         Passed - Valid encounter within last 12 months    Recent Outpatient Visits  2 weeks ago Type 2 diabetes mellitus with hyperglycemia, without long-term current use of insulin [facility])   [place] [facility] [place], Angeline ORN, NP    6 months ago Encounter for general adult medical examination with abnormal findings   [place] [facility] [place], [place], NP   9 months ago Type 2 diabetes mellitus with hyperglycemia, without long-term current use of insulin [facility])   [place] [facility] [place], [place] W, NP   1 year ago Type 2 diabetes mellitus without complication, without long-term current use of insulin [facility])   [place] [facility] [place], Angeline ORN, NP   1 year ago Encounter for general adult medical examination with abnormal findings   [place] [facility] [place], Angeline ORN, NP       Future Appointments             In 5 months Baity, Angeline ORN, NP [place] [facility], [facility]

## 2023-01-21 ENCOUNTER — Other Ambulatory Visit: Payer: Self-pay | Admitting: Internal Medicine

## 2023-01-21 ENCOUNTER — Encounter: Payer: Self-pay | Admitting: Internal Medicine

## 2023-01-25 NOTE — Telephone Encounter (Signed)
 Requested Prescriptions  Pending Prescriptions Disp Refills   lisinopril  (ZESTRIL ) 10 MG tablet [Pharmacy Med Name: LISINOPRIL  10 MG TABLET] 90 tablet 1    Sig: TAKE 1 TABLET BY MOUTH EVERY DAY     Cardiovascular:  ACE Inhibitors Passed - 01/25/2023  1:11 PM      Passed - Cr in normal range and within 180 days    Creat  Date Value Ref Range Status  12/30/2022 1.12 0.60 - 1.29 mg/dL Final   Creatinine, Urine  Date Value Ref Range Status  06/29/2022 71 20 - 320 mg/dL Final         Passed - K in normal range and within 180 days    Potassium  Date Value Ref Range Status  12/30/2022 4.3 3.5 - 5.3 mmol/L Final         Passed - Patient is not pregnant      Passed - Last BP in normal range    BP Readings from Last 1 Encounters:  12/30/22 118/74         Passed - Valid encounter within last 6 months    Recent Outpatient Visits           3 weeks ago Type 2 diabetes mellitus with hyperglycemia, without long-term current use of insulin [facility])   [place] [facility] [place], Angeline ORN, NP   7 months ago Encounter for general adult medical examination with abnormal findings   [place] [facility] [place], [place] W, NP   10 months ago Type 2 diabetes mellitus with hyperglycemia, without long-term current use of insulin [facility])   [place] [facility] [place], [place] W, NP   1 year ago Type 2 diabetes mellitus without complication, without long-term current use of insulin [facility])   [place] [facility] [place], Angeline ORN, NP   1 year ago Encounter for general adult medical examination with abnormal findings   [place] [facility] [place], Angeline ORN, NP       Future Appointments             In 5 months Baity, Angeline ORN, NP [place] [facility], [facility]

## 2023-02-11 ENCOUNTER — Other Ambulatory Visit: Payer: Self-pay

## 2023-02-11 MED ORDER — LISINOPRIL 10 MG PO TABS: 10.0000 mg | ORAL_TABLET | Freq: Every day | ORAL | 1 refills | Status: DC

## 2023-03-31 ENCOUNTER — Other Ambulatory Visit: Payer: Self-pay | Admitting: Internal Medicine

## 2023-03-31 NOTE — Telephone Encounter (Signed)
 Requested Prescriptions  Pending Prescriptions Disp Refills   rosuvastatin (CRESTOR) 10 MG tablet [Pharmacy Med Name: ROSUVASTATIN CALCIUM 10 MG TAB] 90 tablet 0    Sig: TAKE 1 TABLET BY MOUTH EVERY DAY     Cardiovascular:  Antilipid - Statins 2 Failed - 03/31/2023  1:59 PM      Failed - Lipid Panel in normal range within the last 12 months    Cholesterol, Total  Date Value Ref Range Status  06/18/2020 150 100 - 199 mg/dL Final   Cholesterol  Date Value Ref Range Status  12/30/2022 119 <200 mg/dL Final   LDL Cholesterol (Calc)  Date Value Ref Range Status  12/30/2022 60 mg/dL (calc) Final    Comment:    Reference range: <100 . Desirable range <100 mg/dL for primary prevention;   <70 mg/dL for patients with CHD or diabetic patients  with > or = 2 CHD risk factors. Marland Kitchen LDL-C is now calculated using the Martin-Hopkins  calculation, which is a validated novel method providing  better accuracy than the Friedewald equation in the  estimation of LDL-C.  Horald Pollen et al. Lenox Ahr. 1478;295(62): 2061-2068  (http://education.[employer].com/faq/FAQ164)    Direct LDL  Date Value Ref Range Status  01/04/2020 119.0 mg/dL Final    Comment:    Optimal:  <100 mg/dLNear or Above Optimal:  100-129 mg/dLBorderline High:  130-159 mg/dLHigh:  160-189 mg/dLVery High:  >190 mg/dL   HDL  Date Value Ref Range Status  12/30/2022 37 (L) > OR = 40 mg/dL Final  13/08/6576 29 (L) >39 mg/dL Final   Triglycerides  Date Value Ref Range Status  12/30/2022 136 <150 mg/dL Final         Passed - Cr in normal range and within 360 days    Creat  Date Value Ref Range Status  12/30/2022 1.12 0.60 - 1.29 mg/dL Final   Creatinine, Urine  Date Value Ref Range Status  06/29/2022 71 20 - 320 mg/dL Final         Passed - Patient is not pregnant      Passed - Valid encounter within last 12 months    Recent Outpatient Visits           3 months ago Type 2 diabetes mellitus with hyperglycemia, without  long-term current use of insulin [facility])   [place] [facility] [place], Salvadore [place], NP   9 months ago Encounter for general adult medical examination with abnormal findings   [place] [facility] [place], [place], NP   1 year ago Type 2 diabetes mellitus with hyperglycemia, without long-term current use of insulin [facility])   [place] [facility] [place], [place] W, NP   1 year ago Type 2 diabetes mellitus without complication, without long-term current use of insulin [facility])   [place] [facility] [place], Salvadore [place], NP   1 year ago Encounter for general adult medical examination with abnormal findings   [place] [facility] [place], Salvadore Oxford, NP       Future Appointments             In 3 months Baity, Salvadore [place], NP [place] [facility], [facility]

## 2023-04-05 ENCOUNTER — Encounter: Payer: Self-pay | Admitting: Internal Medicine

## 2023-04-05 ENCOUNTER — Other Ambulatory Visit: Payer: Self-pay

## 2023-04-05 MED ORDER — FENOFIBRATE 145 MG PO TABS: 145.0000 mg | ORAL_TABLET | Freq: Every day | ORAL | 1 refills | Status: DC

## 2023-04-23 ENCOUNTER — Other Ambulatory Visit: Payer: Self-pay | Admitting: Internal Medicine

## 2023-04-26 IMAGING — DX DG SHOULDER 2+V*L*
3 series · 3 of 3 positions shown · non-contrast
Comparison: None Available.

CLINICAL DATA: Chronic bilateral shoulder pain.

EXAM:
LEFT SHOULDER - 2+ VIEW; RIGHT SHOULDER - 2+ VIEW

[shoulder ap]
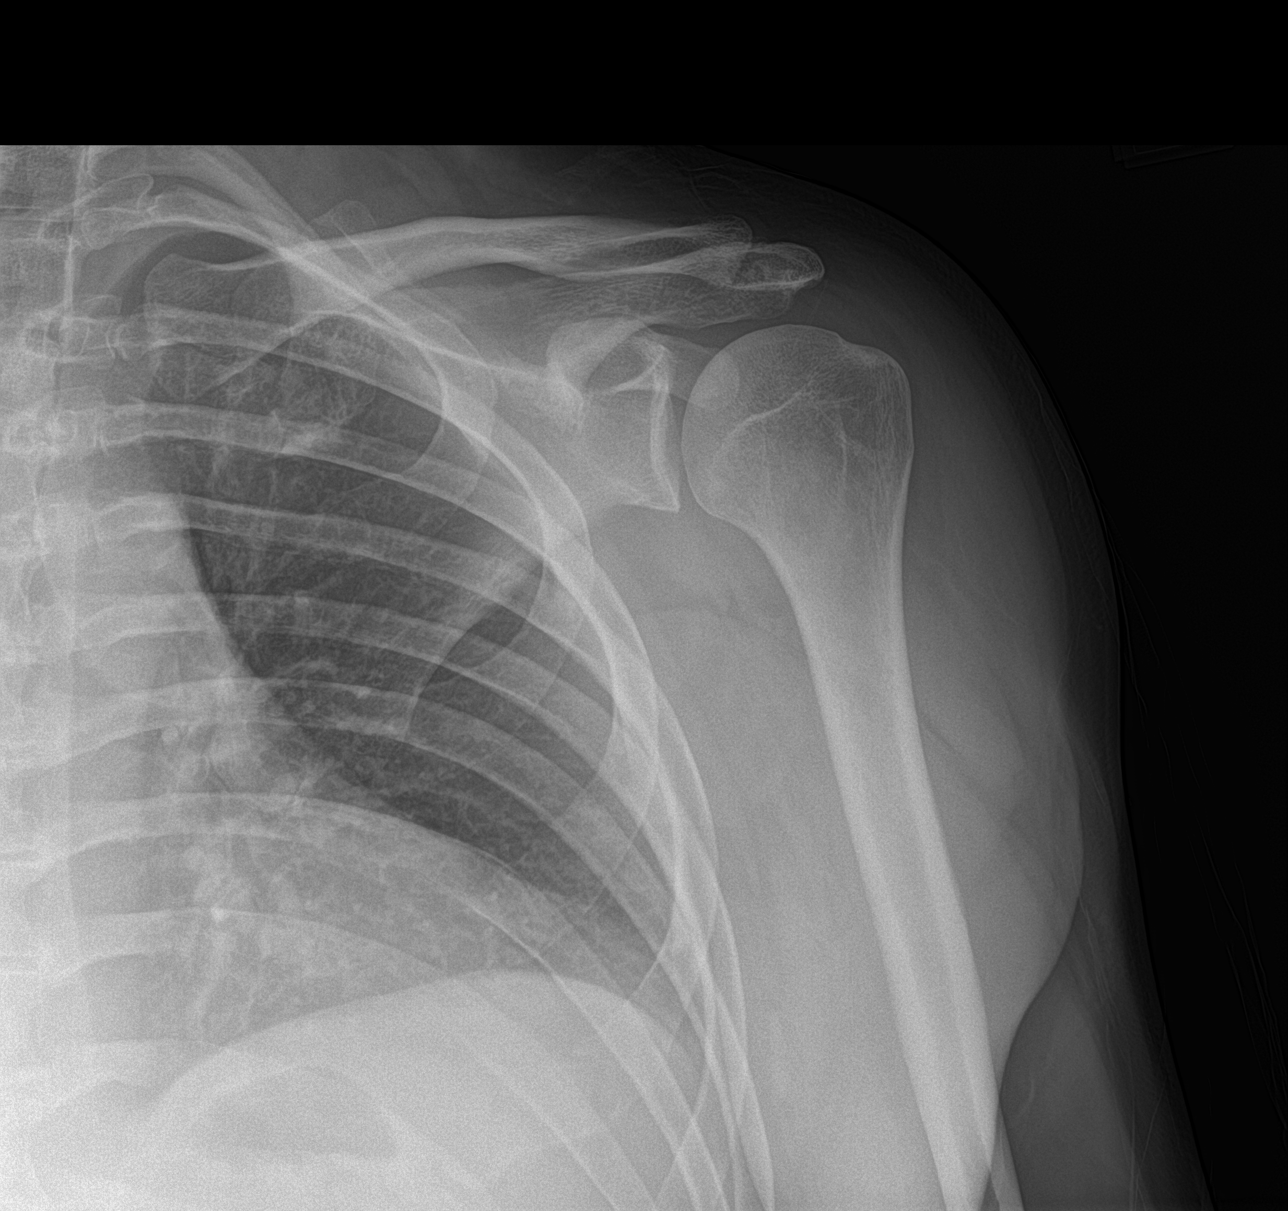

[shoulder y-view]
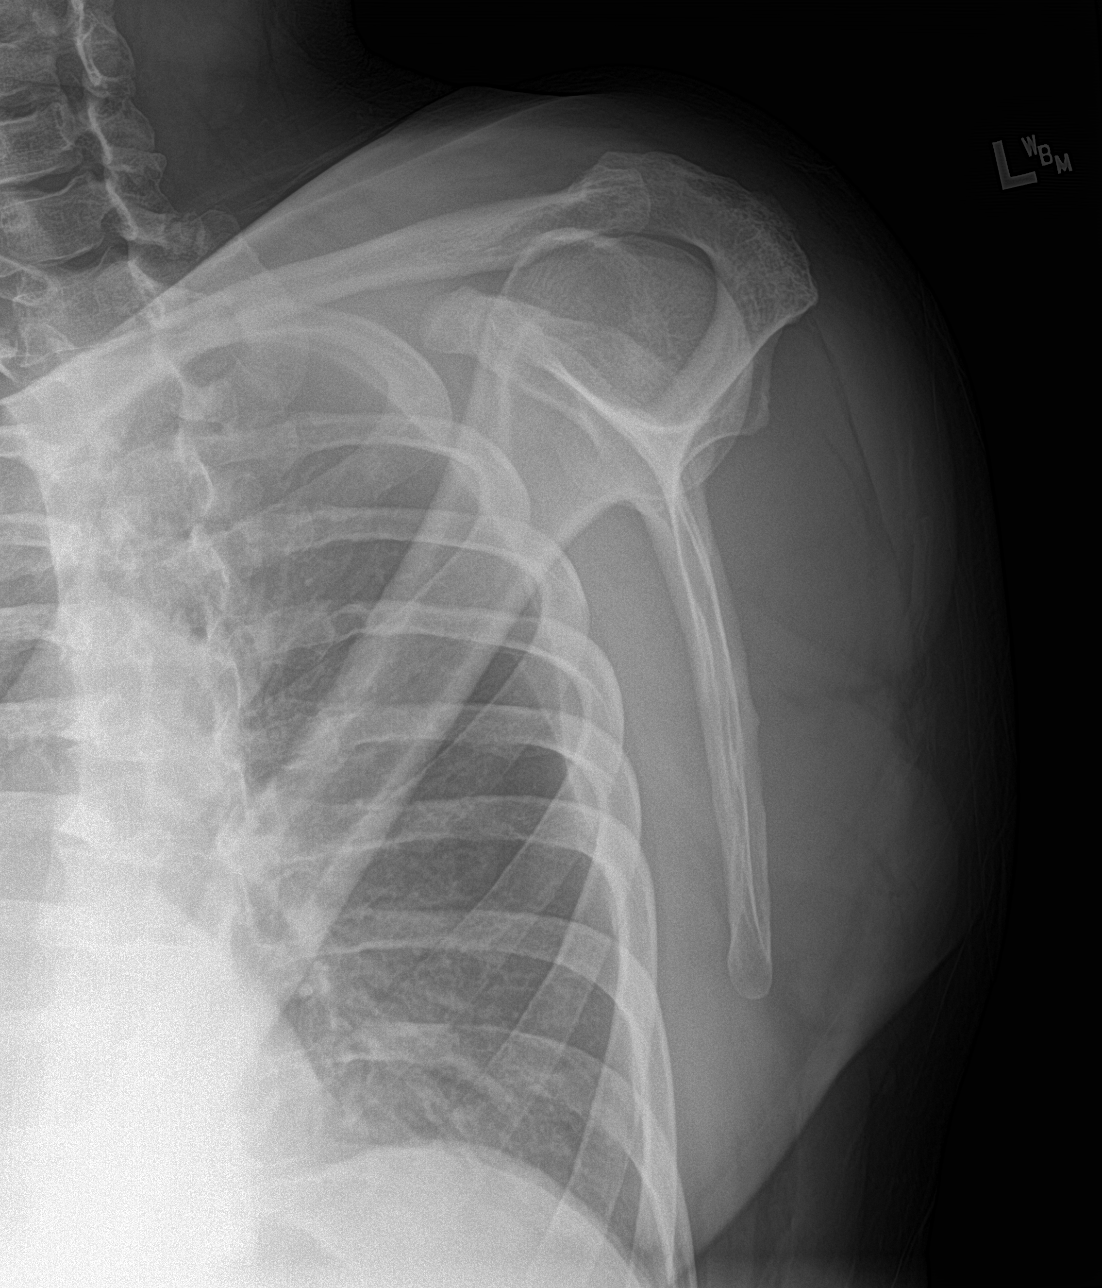

[shoulder axial]
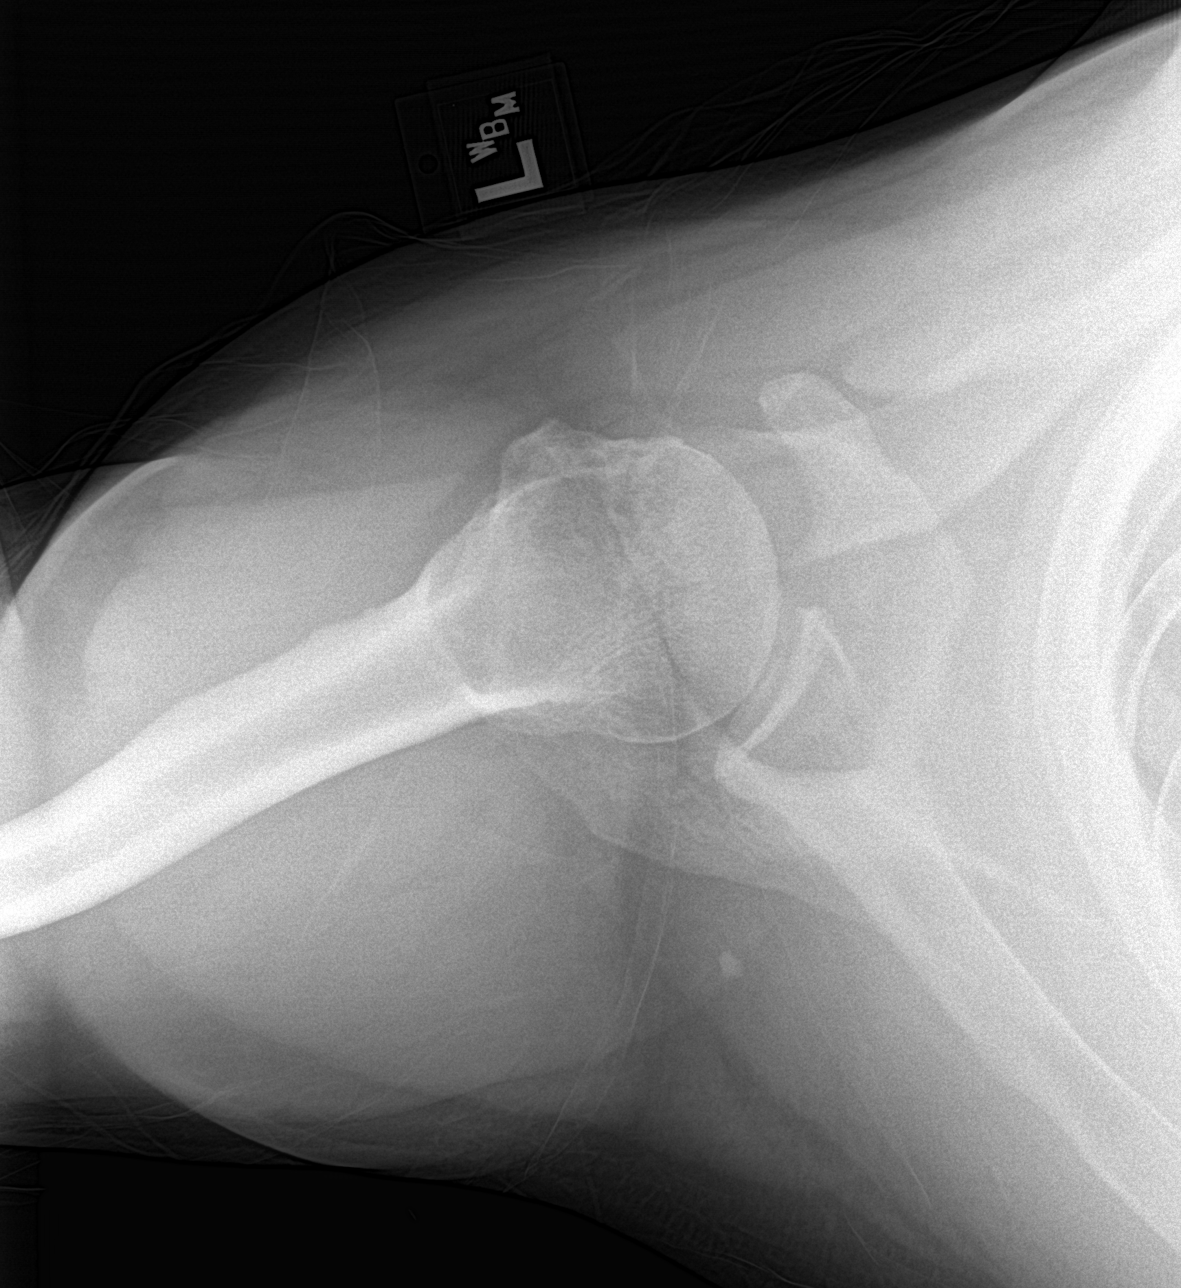

[3 of 3 positions shown; findings below may reference images not displayed]

FINDINGS: There is no evidence of fracture or dislocation. Minimal calcific
density is identified lateral to the proximal aspect of the right
humerus questioned calcification at the rotator cuff tendon
insertions. The left shoulder is normal. Soft tissues are
unremarkable.
IMPRESSION: Minimal calcific density is identified lateral to the proximal
aspect of the right humerus questioned calcification at the rotator
cuff tendon insertions.

## 2023-04-26 NOTE — Telephone Encounter (Signed)
 Requested Prescriptions  Pending Prescriptions Disp Refills   glipiZIDE (GLUCOTROL XL) 10 MG 24 hr tablet [Pharmacy Med Name: GLIPIZIDE ER 10 MG TABLET] 90 tablet 0    Sig: TAKE 1 TABLET (10 MG TOTAL) BY MOUTH DAILY WITH BREAKFAST.     Endocrinology:  Diabetes - Sulfonylureas Failed - 04/26/2023  8:05 AM      Failed - Valid encounter within last 6 months    Recent Outpatient Visits   None     Future Appointments             In 2 months Baity, Salvadore [place], NP [place] [facility]            Passed - HBA1C is between 0 and 7.9 and within 180 days    Hgb A1c MFr Bld  Date Value Ref Range Status  12/30/2022 5.4 <5.7 % of total Hgb Final    Comment:    For the purpose of screening for the presence of diabetes: . <5.7%       Consistent with the absence of diabetes 5.7-6.4%    Consistent with increased risk for diabetes             (prediabetes) > or =6.5%  Consistent with diabetes . This assay result is consistent with a decreased risk of diabetes. . Currently, no consensus exists regarding use of hemoglobin A1c for diagnosis of diabetes in children. . According to American Diabetes Association (ADA) guidelines, hemoglobin A1c <7.0% represents optimal control in non-pregnant diabetic patients. Different metrics may apply to specific patient populations.  Standards of Medical Care in Diabetes(ADA). .          Passed - Cr in normal range and within 360 days    Creat  Date Value Ref Range Status  12/30/2022 1.12 0.60 - 1.29 mg/dL Final   Creatinine, Urine  Date Value Ref Range Status  06/29/2022 71 20 - 320 mg/dL Final

## 2023-05-04 ENCOUNTER — Encounter: Payer: Self-pay | Admitting: Internal Medicine

## 2023-05-04 MED ORDER — MOUNJARO 5 MG/0.5ML ~~LOC~~ SOAJ: 5.0000 mg | SUBCUTANEOUS | 0 refills | Status: DC

## 2023-07-01 ENCOUNTER — Ambulatory Visit (INDEPENDENT_AMBULATORY_CARE_PROVIDER_SITE_OTHER): Payer: Self-pay | Admitting: Internal Medicine

## 2023-07-01 ENCOUNTER — Encounter: Payer: Self-pay | Admitting: Internal Medicine

## 2023-07-01 VITALS — BP 118/78 | Ht 71.0 in | Wt 221.6 lb

## 2023-07-01 DIAGNOSIS — Z7984 Long term (current) use of oral hypoglycemic drugs: Secondary | ICD-10-CM

## 2023-07-01 DIAGNOSIS — E1165 Type 2 diabetes mellitus with hyperglycemia: Secondary | ICD-10-CM | POA: Diagnosis not present

## 2023-07-01 DIAGNOSIS — Z0001 Encounter for general adult medical examination with abnormal findings: Secondary | ICD-10-CM | POA: Diagnosis not present

## 2023-07-01 DIAGNOSIS — Z7985 Long-term (current) use of injectable non-insulin antidiabetic drugs: Secondary | ICD-10-CM

## 2023-07-01 DIAGNOSIS — Z6830 Body mass index (BMI) 30.0-30.9, adult: Secondary | ICD-10-CM

## 2023-07-01 DIAGNOSIS — E66811 Obesity, class 1: Secondary | ICD-10-CM

## 2023-07-01 DIAGNOSIS — E6609 Other obesity due to excess calories: Secondary | ICD-10-CM

## 2023-07-01 MED ORDER — TADALAFIL 5 MG PO TABS: 5.0000 mg | ORAL_TABLET | Freq: Every day | ORAL | 1 refills | Status: DC

## 2023-07-01 MED ORDER — ROSUVASTATIN CALCIUM 10 MG PO TABS: 10.0000 mg | ORAL_TABLET | Freq: Every day | ORAL | 1 refills | Status: DC

## 2023-07-01 MED ORDER — LISINOPRIL 10 MG PO TABS: 10.0000 mg | ORAL_TABLET | Freq: Every day | ORAL | 1 refills | Status: DC

## 2023-07-01 NOTE — Progress Notes (Signed)
 Subjective:    Patient ID: Jerry Reid, male    DOB: 1975/03/08, 48 y.o.   MRN: [id number]  HPI  Patient presents to clinic today for his annual exam.  Flu: 12/2022 Tetanus: 06/2021 COVID: Pfizer x 2 Pneumovax: 12/2021 Shingrix: never Colon screening: 07/2021 Vision screening: annually Dentist: biannually  Diet: He does eat meat. He consumes fruits and veggies. He does eat some fried foods. He drinks mostly Dt. Soda, flavored water Exercise: None  Review of Systems     Past Medical History:  Diagnosis Date   Chest pain    Chicken pox    Cholelithiasis    Diabetes mellitus without complication (HCC)    Dysplastic nevus    sees Dr. Tresa Frohlich PAC [place]    HTN (hypertension)    Hyperlipidemia    Kidney stones     Current Outpatient Medications  Medication Sig Dispense Refill   Blood Glucose Monitoring Suppl (ONETOUCH VERIO) w/Device KIT 1 Device by Does not apply route daily. 1 kit 0   fenofibrate  (TRICOR ) 145 MG tablet Take 1 tablet (145 mg total) by mouth daily. 90 tablet 1   glipiZIDE  (GLUCOTROL  XL) 10 MG 24 hr tablet TAKE 1 TABLET (10 MG TOTAL) BY MOUTH DAILY WITH BREAKFAST. 90 tablet 0   glucose blood (ONETOUCH VERIO) test strip 1 each by Other route in the morning and at bedtime. Use as instructed 100 strip 12   Lancets (ONETOUCH ULTRASOFT) lancets Use as instructed 100 each 12   lisinopril  (ZESTRIL ) 10 MG tablet Take 1 tablet (10 mg total) by mouth daily. 90 tablet 1   rosuvastatin  (CRESTOR ) 10 MG tablet TAKE 1 TABLET BY MOUTH EVERY DAY 90 tablet 0   sildenafil  (VIAGRA ) 50 MG tablet Take 1 tablet (50 mg total) by mouth daily as needed for erectile dysfunction. 10 tablet 5   tirzepatide  (MOUNJARO ) 5 MG/0.5ML Pen Inject 5 mg into the skin once a week. 6 mL 0   No current facility-administered medications for this visit.    Allergies  Allergen Reactions   Metformin  And Related Diarrhea    Family History  Problem Relation Age of Onset   Heart attack Mother     Heart disease Mother    Hyperlipidemia Mother    Hypertension Mother    Diabetes Mother    Hypertension Father    Heart disease Father    Hyperlipidemia Father    Diabetes Father    Prostate cancer Neg Hx    Colon cancer Neg Hx     Social History   Socioeconomic History   Marital status: Married    Spouse name: Not on file   Number of children: Not on file   Years of education: Not on file   Highest education level: Not on file  Occupational History   Not on file  Tobacco Use   Smoking status: Never   Smokeless tobacco: Never  Vaping Use   Vaping status: Never Used  Substance and Sexual Activity   Alcohol use: No   Drug use: No   Sexual activity: Not on file  Other Topics Concern   Not on file  Social History Narrative   Works for the county    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   Social Drivers of Health   Financial Resource Strain: Low Risk  (12/30/2022)   Overall Financial Resource Strain (CARDIA)    Difficulty of Paying Living Expenses: Not hard at all  Food Insecurity: No Food Insecurity (12/30/2022)   Hunger  Vital Sign    Worried About Programme researcher, broadcasting/film/video in the Last Year: Never true    Ran Out of Food in the Last Year: Never true  Transportation Needs: No Transportation Needs (12/30/2022)   PRAPARE - Administrator, Civil Service (Medical): No    Lack of Transportation (Non-Medical): No  Physical Activity: Inactive (12/30/2022)   Exercise Vital Sign    Days of Exercise per Week: 0 days    Minutes of Exercise per Session: 0 min  Stress: No Stress Concern Present (12/30/2022)   [employer] of Occupational Health - Occupational Stress Questionnaire    Feeling of Stress : Not at all  Social Connections: Moderately Integrated (12/30/2022)   Social Connection and Isolation Panel    Frequency of Communication with Friends and Family: Once a week    Frequency of Social Gatherings with Friends and Family: Once a week    Attends Religious  Services: More than 4 times per year    Active Member of [employer] or Organizations: Yes    Attends Engineer, structural: More than 4 times per year    Marital Status: Married  Catering manager Violence: Not At Risk (12/30/2022)   Humiliation, Afraid, Rape, and Kick questionnaire    Fear of Current or Ex-Partner: No    Emotionally Abused: No    Physically Abused: No    Sexually Abused: No     Constitutional: Denies fever, malaise, fatigue, headache or abrupt weight changes.  HEENT: Denies eye pain, eye redness, ear pain, ringing in the ears, wax buildup, runny nose, nasal congestion, bloody nose, or sore throat. Respiratory: Denies difficulty breathing, shortness of breath, cough or sputum production.   Cardiovascular: Denies chest pain, chest tightness, palpitations or swelling in the hands or feet.  Gastrointestinal: Denies abdominal pain, bloating, constipation, diarrhea or blood in the stool.  GU: Patient reports erectile dysfunction.  Denies urgency, frequency, pain with urination, burning sensation, blood in urine, odor or discharge. Musculoskeletal: Patient reports chronic shoulder pain (improved).  Denies decrease in range of motion, difficulty with gait, muscle pain or joint swelling.  Skin: Denies redness, rashes, lesions or ulcercations.  Neurological: Denies dizziness, difficulty with memory, difficulty with speech or problems with balance and coordination.  Psych: Denies anxiety, depression, SI/HI.  No other specific complaints in a complete review of systems (except as listed in HPI above).  Objective:   Physical Exam  BP 118/78 (BP Location: Left Arm, Patient Position: Sitting, Cuff Size: Normal)   Ht 5' 11 (1.803 m)   Wt 221 lb 9.6 oz (100.5 kg)   BMI 30.91 kg/m    Wt Readings from Last 3 Encounters:  12/30/22 216 lb (98 kg)  06/25/22 233 lb (105.7 kg)  03/27/22 234 lb (106.1 kg)    General: Appears his stated age, obese, in NAD. Skin: Warm, dry and  intact. No ulcerations noted. HEENT: Head: normal shape and size; Eyes: sclera white, no icterus, conjunctiva pink, PERRLA and EOMs intact;  Neck:  Neck supple, trachea midline. No masses, lumps or thyromegaly present.  Cardiovascular: Normal rate and rhythm. S1,S2 noted.  No murmur, rubs or gallops noted. No JVD or BLE edema.  Pulmonary/Chest: Normal effort and positive vesicular breath sounds. No respiratory distress. No wheezes, rales or ronchi noted.  Abdomen: Normal bowel sounds.  Musculoskeletal: Strength 5/5 BUE/BLE.  No difficulty with gait.  Neurological: Alert and oriented. Cranial nerves II-XII grossly intact. Coordination normal.  Psychiatric: Mood and affect normal. Behavior is  normal. Judgment and thought content normal.     BMET    Component Value Date/Time   NA 138 12/30/2022 0831   NA 139 07/19/2019 1005   K 4.3 12/30/2022 0831   CL 106 12/30/2022 0831   CO2 23 12/30/2022 0831   GLUCOSE 124 (H) 12/30/2022 0831   BUN 14 12/30/2022 0831   BUN 15 07/19/2019 1005   CREATININE 1.12 12/30/2022 0831   CALCIUM  9.7 12/30/2022 0831   GFRNONAA 96 06/28/2020 0756   GFRAA 111 06/28/2020 0756    Lipid Panel     Component Value Date/Time   CHOL 119 12/30/2022 0831   CHOL 150 06/18/2020 1259   TRIG 136 12/30/2022 0831   HDL 37 (L) 12/30/2022 0831   HDL 29 (L) 06/18/2020 1259   CHOLHDL 3.2 12/30/2022 0831   LDLCALC 60 12/30/2022 0831    CBC    Component Value Date/Time   WBC 6.3 12/30/2022 0831   RBC 5.83 (H) 12/30/2022 0831   HGB 17.1 12/30/2022 0831   HGB 16.9 03/23/2018 0935   HCT 50.3 (H) 12/30/2022 0831   HCT 47.4 03/23/2018 0935   PLT 275 12/30/2022 0831   PLT 231 03/23/2018 0935   MCV 86.3 12/30/2022 0831   MCV 84 03/23/2018 0935   MCH 29.3 12/30/2022 0831   MCHC 34.0 12/30/2022 0831   RDW 13.0 12/30/2022 0831   RDW 13.7 03/23/2018 0935   LYMPHSABS 1.3 03/23/2018 0935   EOSABS 0.0 03/23/2018 0935   BASOSABS 0.0 03/23/2018 0935    Hgb A1C Lab  Results  Component Value Date   HGBA1C 5.4 12/30/2022            Assessment & Plan:   Preventative Health Maintenance:  Encouraged him to get a flu shot in the fall Tetanus UTD Encouraged him to get his COVID booster Pneumovax UTD Discussed Shingrix vaccine, he will check coverage with his insurance company and schedule a visit if he would like to have this done Encouraged him to consume a balanced diet and exercise regimen Advised him to see an eye doctor and dentist annually We will check CBC, c-Met, lipid, A1c and urine microalbumin today  RTC in 6 months, follow-up chronic conditions Helayne Lo, NP

## 2023-07-01 NOTE — Patient Instructions (Signed)
 Health Maintenance, Male  Adopting a healthy lifestyle and getting preventive care are important in promoting health and wellness. Ask your health care provider about:  The right schedule for you to have regular tests and exams.  Things you can do on your own to prevent diseases and keep yourself healthy.  What should I know about diet, weight, and exercise?  Eat a healthy diet    Eat a diet that includes plenty of vegetables, fruits, low-fat dairy products, and lean protein.  Do not eat a lot of foods that are high in solid fats, added sugars, or sodium.  Maintain a healthy weight  Body mass index (BMI) is a measurement that can be used to identify possible weight problems. It estimates body fat based on height and weight. Your health care provider can help determine your BMI and help you achieve or maintain a healthy weight.  Get regular exercise  Get regular exercise. This is one of the most important things you can do for your health. Most adults should:  Exercise for at least 150 minutes each week. The exercise should increase your heart rate and make you sweat (moderate-intensity exercise).  Do strengthening exercises at least twice a week. This is in addition to the moderate-intensity exercise.  Spend less time sitting. Even light physical activity can be beneficial.  Watch cholesterol and blood lipids  Have your blood tested for lipids and cholesterol at 50 years of age, then have this test every 5 years.  You may need to have your cholesterol levels checked more often if:  Your lipid or cholesterol levels are high.  You are older than 50 years of age.  You are at high risk for heart disease.  What should I know about cancer screening?  Many types of cancers can be detected early and may often be prevented. Depending on your health history and family history, you may need to have cancer screening at various ages. This may include screening for:  Colorectal cancer.  Prostate cancer.  Skin cancer.  Lung  cancer.  What should I know about heart disease, diabetes, and high blood pressure?  Blood pressure and heart disease  High blood pressure causes heart disease and increases the risk of stroke. This is more likely to develop in people who have high blood pressure readings or are overweight.  Talk with your health care provider about your target blood pressure readings.  Have your blood pressure checked:  Every 3-5 years if you are 9-95 years of age.  Every year if you are 85 years old or older.  If you are between the ages of 29 and 29 and are a current or former smoker, ask your health care provider if you should have a one-time screening for abdominal aortic aneurysm (AAA).  Diabetes  Have regular diabetes screenings. This checks your fasting blood sugar level. Have the screening done:  Once every three years after age 23 if you are at a normal weight and have a low risk for diabetes.  More often and at a younger age if you are overweight or have a high risk for diabetes.  What should I know about preventing infection?  Hepatitis B  If you have a higher risk for hepatitis B, you should be screened for this virus. Talk with your health care provider to find out if you are at risk for hepatitis B infection.  Hepatitis C  Blood testing is recommended for:  Everyone born from [birth year] through 1967.  Anyone  with known risk factors for hepatitis C.  Sexually transmitted infections (STIs)  You should be screened each year for STIs, including gonorrhea and chlamydia, if:  You are sexually active and are younger than 50 years of age.  You are older than 50 years of age and your health care provider tells you that you are at risk for this type of infection.  Your sexual activity has changed since you were last screened, and you are at increased risk for chlamydia or gonorrhea. Ask your health care provider if you are at risk.  Ask your health care provider about whether you are at high risk for HIV. Your health care provider  may recommend a prescription medicine to help prevent HIV infection. If you choose to take medicine to prevent HIV, you should first get tested for HIV. You should then be tested every 3 months for as long as you are taking the medicine.  Follow these instructions at home:  Alcohol use  Do not drink alcohol if your health care provider tells you not to drink.  If you drink alcohol:  Limit how much you have to 0-2 drinks a day.  Know how much alcohol is in your drink. In the [place] one drink equals one 12 oz bottle of beer (355 mL), one 5 oz glass of wine (148 mL), or one 1 oz glass of hard liquor (44 mL).  Lifestyle  Do not use any products that contain nicotine or tobacco. These products include cigarettes, chewing tobacco, and vaping devices, such as e-cigarettes. If you need help quitting, ask your health care provider.  Do not use street drugs.  Do not share needles.  Ask your health care provider for help if you need support or information about quitting drugs.  General instructions  Schedule regular health, dental, and eye exams.  Stay current with your vaccines.  Tell your health care provider if:  You often feel depressed.  You have ever been abused or do not feel safe at home.  Summary  Adopting a healthy lifestyle and getting preventive care are important in promoting health and wellness.  Follow your health care provider's instructions about healthy diet, exercising, and getting tested or screened for diseases.  Follow your health care provider's instructions on monitoring your cholesterol and blood pressure.  This information is not intended to replace advice given to you by your health care provider. Make sure you discuss any questions you have with your health care provider.  Document Revised: 05/27/2020 Document Reviewed: 05/27/2020  Elsevier Patient Education  2024 [employer].

## 2023-07-01 NOTE — Assessment & Plan Note (Signed)
 Encouraged diet and exercise for weight loss ?

## 2023-07-02 ENCOUNTER — Other Ambulatory Visit (HOSPITAL_COMMUNITY): Payer: Self-pay

## 2023-07-02 ENCOUNTER — Ambulatory Visit: Payer: Self-pay | Admitting: Internal Medicine

## 2023-07-02 ENCOUNTER — Other Ambulatory Visit: Payer: Self-pay

## 2023-07-02 ENCOUNTER — Telehealth: Payer: Self-pay

## 2023-07-02 LAB — MICROALBUMIN / CREATININE URINE RATIO
Creatinine, Urine: 250 mg/dL (ref 20–320)
Microalb Creat Ratio: 2 mg/g{creat} (ref <30)
Microalb, Ur: 0.5 mg/dL

## 2023-07-02 LAB — CBC
HCT: 43.8 % (ref 38.5–50.0)
Hemoglobin: 14.7 g/dL (ref 13.2–17.1)
MCH: 30.2 pg (ref 27.0–33.0)
MCHC: 33.6 g/dL (ref 32.0–36.0)
MCV: 90.1 fL (ref 80.0–100.0)
MPV: 9.3 fL (ref 7.5–12.5)
Platelets: 243 10*3/uL (ref 140–400)
RBC: 4.86 10*6/uL (ref 4.20–5.80)
RDW: 12.8 % (ref 11.0–15.0)
WBC: 5.9 10*3/uL (ref 3.8–10.8)

## 2023-07-02 LAB — COMPREHENSIVE METABOLIC PANEL WITH GFR
AG Ratio: 2 (calc) (ref 1.0–2.5)
ALT: 24 U/L (ref 9–46)
AST: 16 U/L (ref 10–35)
Albumin: 4.5 g/dL (ref 3.6–5.1)
Alkaline phosphatase (APISO): 26 U/L — ABNORMAL LOW (ref 35–144)
BUN: 12 mg/dL (ref 7–25)
CO2: 23 mmol/L (ref 20–32)
Calcium: 9.3 mg/dL (ref 8.6–10.3)
Chloride: 108 mmol/L (ref 98–110)
Creat: 1.01 mg/dL (ref 0.70–1.30)
Globulin: 2.2 g/dL (ref 1.9–3.7)
Glucose, Bld: 134 mg/dL — ABNORMAL HIGH (ref 65–99)
Potassium: 4 mmol/L (ref 3.5–5.3)
Sodium: 138 mmol/L (ref 135–146)
Total Bilirubin: 0.9 mg/dL (ref 0.2–1.2)
Total Protein: 6.7 g/dL (ref 6.1–8.1)
eGFR: 91 mL/min/{1.73_m2} (ref >=60)

## 2023-07-02 LAB — LIPID PANEL
Cholesterol: 98 mg/dL (ref <200)
HDL: 37 mg/dL — ABNORMAL LOW (ref >=40)
LDL Cholesterol (Calc): 45 mg/dL
Non-HDL Cholesterol (Calc): 61 mg/dL (ref <130)
Total CHOL/HDL Ratio: 2.6 (calc) (ref <5.0)
Triglycerides: 76 mg/dL (ref <150)

## 2023-07-02 LAB — HEMOGLOBIN A1C
Hgb A1c MFr Bld: 5.4 % (ref <5.7)
Mean Plasma Glucose: 108 mg/dL
eAG (mmol/L): 6 mmol/L

## 2023-07-02 MED ORDER — TIRZEPATIDE 7.5 MG/0.5ML ~~LOC~~ SOAJ: 7.5000 mg | SUBCUTANEOUS | 0 refills | Status: DC

## 2023-07-02 MED ORDER — GLIPIZIDE ER 5 MG PO TB24: 5.0000 mg | ORAL_TABLET | Freq: Every day | ORAL | 0 refills | Status: DC

## 2023-07-02 NOTE — Telephone Encounter (Signed)
 Pharmacy Patient Advocate Encounter   Received notification from CoverMyMeds that prior authorization for Mounjaro  7.5MG /0.5ML auto-injectors is required/requested.   Insurance verification completed.   The patient is insured through [employer] .   Per test claim: PA required; PA submitted to above mentioned insurance via CoverMyMeds Key/confirmation #/EOC (Key: [id number])    Status is pending

## 2023-07-09 NOTE — Telephone Encounter (Signed)
 Pharmacy Patient Advocate Encounter  Received notification from [facility] that Prior Authorization for Mounjaro  7.5MG /0.5ML auto-injectors has been APPROVED from 6.6.25 to 7.20.25. Ran test claim, . This test claim was processed through [facility]- copay amounts may vary at other pharmacies due to pharmacy/plan contracts, or as the patient moves through the different stages of their insurance plan.   PA #/Case ID/Reference #: (Key: [id number])

## 2023-08-17 ENCOUNTER — Encounter: Payer: Self-pay | Admitting: Internal Medicine

## 2023-08-20 ENCOUNTER — Ambulatory Visit: Admitting: Internal Medicine

## 2023-08-20 ENCOUNTER — Encounter: Payer: Self-pay | Admitting: Internal Medicine

## 2023-08-20 VITALS — BP 124/84 | Ht 71.0 in | Wt 220.8 lb

## 2023-08-20 DIAGNOSIS — R5383 Other fatigue: Secondary | ICD-10-CM | POA: Diagnosis not present

## 2023-08-20 DIAGNOSIS — R0683 Snoring: Secondary | ICD-10-CM | POA: Diagnosis not present

## 2023-08-20 DIAGNOSIS — B029 Zoster without complications: Secondary | ICD-10-CM | POA: Diagnosis not present

## 2023-08-20 MED ORDER — TIRZEPATIDE 7.5 MG/0.5ML ~~LOC~~ SOAJ: 7.5000 mg | SUBCUTANEOUS | 1 refills | Status: DC

## 2023-08-20 MED ORDER — VALACYCLOVIR HCL 1 G PO TABS: 1000.0000 mg | ORAL_TABLET | Freq: Three times a day (TID) | ORAL | 0 refills | Status: DC

## 2023-08-20 MED ORDER — GABAPENTIN 100 MG PO CAPS: 100.0000 mg | ORAL_CAPSULE | Freq: Three times a day (TID) | ORAL | 0 refills | Status: DC

## 2023-08-20 NOTE — Progress Notes (Signed)
 Subjective:    Patient ID: Jerry Reid, male    DOB: Apr 11, 1975, 48 y.o.   MRN: [id number]  HPI  Discussed the use of AI scribe software for clinical note transcription with the patient, who gave verbal consent to proceed.  Jerry Reid is a 48 year old male with diabetes who presents with a rash and pain on the left side of his face.  He developed pain on the left side of his jaw and left ear about a week ago, followed by a rash on his face and head, all on the left side. The sensation is described as feeling 'sunburnt' with a terrible sore throat. The rash initially appeared like a pimple and has been tender, causing him to avoid shaving over the area. The pain in his ear feels like 'an ice pick' being inserted, occurring about twice an hour and lasting for half a second to a second. No vision changes or ringing in the ear.  He has been experiencing significant fatigue for the past couple of months, despite sleeping well at night. He does not feel rested upon waking and has nearly fallen asleep while driving. He does snores but has never been diagnosed with sleep apnea.  No mood issues such as stress, anxiety, or depression, attributing his fatigue to a busy work schedule over the past ninety days. He has a history of low testosterone , which was investigated when he was trying to have children, and he notes feeling different since being diagnosed with diabetes and starting medication. He has lost weight but continues to feel tired.    Review of Systems     Past Medical History:  Diagnosis Date   Chest pain    Chicken pox    Cholelithiasis    Diabetes mellitus without complication (HCC)    Dysplastic nevus    sees Dr. Garon PAC [place]    HTN (hypertension)    Hyperlipidemia    Kidney stones     Current Outpatient Medications  Medication Sig Dispense Refill   glipiZIDE  (GLUCOTROL  XL) 5 MG 24 hr tablet Take 1 tablet (5 mg total) by mouth daily with breakfast. 90 tablet 0    tirzepatide  (MOUNJARO ) 7.5 MG/0.5ML Pen Inject 7.5 mg into the skin once a week. 6 mL 0   Blood Glucose Monitoring Suppl (ONETOUCH VERIO) w/Device KIT 1 Device by Does not apply route daily. 1 kit 0   fenofibrate  (TRICOR ) 145 MG tablet Take 1 tablet (145 mg total) by mouth daily. 90 tablet 1   glucose blood (ONETOUCH VERIO) test strip 1 each by Other route in the morning and at bedtime. Use as instructed 100 strip 12   Lancets (ONETOUCH ULTRASOFT) lancets Use as instructed 100 each 12   lisinopril  (ZESTRIL ) 10 MG tablet Take 1 tablet (10 mg total) by mouth daily. 90 tablet 1   rosuvastatin  (CRESTOR ) 10 MG tablet Take 1 tablet (10 mg total) by mouth daily. 90 tablet 1   sildenafil  (VIAGRA ) 50 MG tablet Take 1 tablet (50 mg total) by mouth daily as needed for erectile dysfunction. 10 tablet 5   tadalafil  (CIALIS ) 5 MG tablet Take 1 tablet (5 mg total) by mouth daily. 90 tablet 1   No current facility-administered medications for this visit.    Allergies  Allergen Reactions   Metformin  And Related Diarrhea    Family History  Problem Relation Age of Onset   Heart attack Mother    Heart disease Mother    Hyperlipidemia Mother  Hypertension Mother    Diabetes Mother    Hypertension Father    Heart disease Father    Hyperlipidemia Father    Diabetes Father    Prostate cancer Neg Hx    Colon cancer Neg Hx     Social History   Socioeconomic History   Marital status: Married    Spouse name: Not on file   Number of children: Not on file   Years of education: Not on file   Highest education level: Not on file  Occupational History   Not on file  Tobacco Use   Smoking status: Never   Smokeless tobacco: Never  Vaping Use   Vaping status: Never Used  Substance and Sexual Activity   Alcohol use: No   Drug use: No   Sexual activity: Not on file  Other Topics Concern   Not on file  Social History Narrative   Works for the county    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    Social Drivers of Health   Financial Resource Strain: Low Risk  (12/30/2022)   Overall Financial Resource Strain (CARDIA)    Difficulty of Paying Living Expenses: Not hard at all  Food Insecurity: No Food Insecurity (12/30/2022)   Hunger Vital Sign    Worried About Running Out of Food in the Last Year: Never true    Ran Out of Food in the Last Year: Never true  Transportation Needs: No Transportation Needs (12/30/2022)   PRAPARE - Administrator, Civil Service (Medical): No    Lack of Transportation (Non-Medical): No  Physical Activity: Inactive (12/30/2022)   Exercise Vital Sign    Days of Exercise per Week: 0 days    Minutes of Exercise per Session: 0 min  Stress: No Stress Concern Present (12/30/2022)   [employer] of Occupational Health - Occupational Stress Questionnaire    Feeling of Stress : Not at all  Social Connections: Moderately Integrated (12/30/2022)   Social Connection and Isolation Panel    Frequency of Communication with Friends and Family: Once a week    Frequency of Social Gatherings with Friends and Family: Once a week    Attends Religious Services: More than 4 times per year    Active Member of [employer] or Organizations: Yes    Attends Banker Meetings: More than 4 times per year    Marital Status: Married  Catering manager Violence: Not At Risk (12/30/2022)   Humiliation, Afraid, Rape, and Kick questionnaire    Fear of Current or Ex-Partner: No    Emotionally Abused: No    Physically Abused: No    Sexually Abused: No     Constitutional: Patient reports fatigue.  Denies fever, malaise, headache or abrupt weight changes.  HEENT: Patient reports left ear pain, abnormal tongue mucosa.  Denies eye pain, eye redness, ringing in the ears, wax buildup, runny nose, nasal congestion, bloody nose, or sore throat. Respiratory: Denies difficulty breathing, shortness of breath, cough or sputum production.   Cardiovascular: Denies chest  pain, chest tightness, palpitations or swelling in the hands or feet.  Gastrointestinal: Denies abdominal pain, bloating, constipation, diarrhea or blood in the stool.  GU: Patient reports erectile dysfunction.  Denies urgency, frequency, pain with urination, burning sensation, blood in urine, odor or discharge. Musculoskeletal: Patient reports chronic shoulder pain (improved), left side jaw pain.  Denies decrease in range of motion, difficulty with gait, muscle pain or joint swelling.  Skin: Patient reports a rash on his left  side of his face.  Denies ulcercations.  Neurological: Denies dizziness, difficulty with memory, difficulty with speech or problems with balance and coordination.  Psych: Denies anxiety, depression, SI/HI.  No other specific complaints in a complete review of systems (except as listed in HPI above).  Objective:   Physical Exam BP 124/84 (BP Location: Right Arm, Patient Position: Sitting, Cuff Size: Large)   Ht 5' 11 (1.803 m)   Wt 220 lb 12.8 oz (100.2 kg)   BMI 30.80 kg/m    Wt Readings from Last 3 Encounters:  07/01/23 221 lb 9.6 oz (100.5 kg)  12/30/22 216 lb (98 kg)  06/25/22 233 lb (105.7 kg)    General: Appears his stated age, obese, in NAD. Skin: Warm, dry and intact.  Grouped, vesicular rash on erythematous base noted in a dermatomal pattern starting from the left temple extending down to the left cheek area. HEENT: Head: normal shape and size; Eyes: sclera white, no icterus, conjunctiva pink, PERRLA and EOMs intact;  Neck: No adenopathy noted. Cardiovascular: Normal rate and rhythm. S1,S2 noted.  No murmur, rubs or gallops noted. No JVD or BLE edema.  Pulmonary/Chest: Normal effort and positive vesicular breath sounds. No respiratory distress. No wheezes, rales or ronchi noted.  Musculoskeletal: No difficulty with gait.  Neurological: Alert and oriented. Coordination normal.  Psychiatric: Mood and affect normal. Behavior is normal. Judgment and  thought content normal.     BMET    Component Value Date/Time   NA 138 07/01/2023 0839   NA 139 07/19/2019 1005   K 4.0 07/01/2023 0839   CL 108 07/01/2023 0839   CO2 23 07/01/2023 0839   GLUCOSE 134 (H) 07/01/2023 0839   BUN 12 07/01/2023 0839   BUN 15 07/19/2019 1005   CREATININE 1.01 07/01/2023 0839   CALCIUM  9.3 07/01/2023 0839   GFRNONAA 96 06/28/2020 0756   GFRAA 111 06/28/2020 0756    Lipid Panel     Component Value Date/Time   CHOL 98 07/01/2023 0839   CHOL 150 06/18/2020 1259   TRIG 76 07/01/2023 0839   HDL 37 (L) 07/01/2023 0839   HDL 29 (L) 06/18/2020 1259   CHOLHDL 2.6 07/01/2023 0839   LDLCALC 45 07/01/2023 0839    CBC    Component Value Date/Time   WBC 5.9 07/01/2023 0839   RBC 4.86 07/01/2023 0839   HGB 14.7 07/01/2023 0839   HGB 16.9 03/23/2018 0935   HCT 43.8 07/01/2023 0839   HCT 47.4 03/23/2018 0935   PLT 243 07/01/2023 0839   PLT 231 03/23/2018 0935   MCV 90.1 07/01/2023 0839   MCV 84 03/23/2018 0935   MCH 30.2 07/01/2023 0839   MCHC 33.6 07/01/2023 0839   RDW 12.8 07/01/2023 0839   RDW 13.7 03/23/2018 0935   LYMPHSABS 1.3 03/23/2018 0935   EOSABS 0.0 03/23/2018 0935   BASOSABS 0.0 03/23/2018 0935    Hgb A1C Lab Results  Component Value Date   HGBA1C 5.4 07/01/2023            Assessment & Plan:   Assessment and Plan    Shingles involving left facial dermatome Shingles with left facial dermatome involvement, improving after one week. No vision or hearing changes, reducing risk of complications. Rash follows dermatome pattern, consistent with shingles. Treatment initiated beyond 72-hour window. - Prescribed Valtrex 1 gram TID for 7 days. - Prescribed gabapentin 100 mg TID PRN for pain. - Advised on gabapentin-induced drowsiness. - Recommended shingles vaccine 90 days post-resolution.  Fatigue Chronic  fatigue over months, no improvement with sleep. Possible factors: increased workload, potential sleep apnea. No mood  disorders. Low testosterone  not pursued. Monitoring symptoms, further evaluation if persists. - Offered vitamin D , B12, thyroid, testosterone  level checks.  He declines at this time - Discussed home sleep study for sleep apnea however he would like to hold off at this time.        RTC in 4 months, follow-up chronic conditions Angeline Laura, NP

## 2023-08-20 NOTE — Patient Instructions (Signed)
 Shingles  Shingles, or herpes zoster, is an infection. It gives you a skin rash and blisters. These infected areas may hurt a lot. Shingles only happens if: You've had chickenpox. You've been given a shot called a vaccine to protect you from getting chickenpox. Shingles is rare in this case. What are the causes? Shingles is caused by a germ called the varicella-zoster virus. This is the same germ that causes chickenpox. After you're exposed to the germ, it stays in your body but is dormant. This means it isn't active. Shingles happens if the germ becomes active again. This can happen years after you're first exposed to the germ. What increases the risk? You may be more likely to get shingles if: You're older than 50 years of age. You're under a lot of stress. You have a weak immune system. The immune system is your body's defense system. It may be weak if: You have human immunodeficiency virus (HIV). You have acquired immunodeficiency syndrome (AIDS). You have cancer. You take medicines that weaken your immune system. These include organ transplant medicines. What are the signs or symptoms? The first symptoms of shingles may be itching, tingling, or pain. Your skin may feel like it's burning. A few days or weeks later, you'll get a rash. Here's what you can expect: The rash is likely to be on one side of your body. The rash may be shaped like a belt or a band. Over time, it will turn into blisters filled with fluid. The blisters will break open and change into scabs. The scabs will dry up in about 2-3 weeks. You may also have: A fever. Chills. A headache. Nausea. How is this diagnosed? Shingles is diagnosed with a skin exam. A sample called a culture may be taken from one of your blisters and sent to a lab. This will show if you have shingles. How is this treated? The rash may last for several weeks. There's no cure for shingles, but your health care provider may give you medicines.  These medicines may: Help with pain. Help with itching. Help with irritation and swelling. Help you get better sooner. Help to prevent long-term problems. If the rash is on your face, you may need to see an eye doctor or an ear, nose, and throat (ENT) doctor. Follow these instructions at home: Medicines Take your medicines only as told by your provider. Put an anti-itch cream or numbing cream on the rash or blisters as told by your provider. Relieving itching and discomfort  To help with itching: Put cold, wet cloths called cold compresses on the rash or blisters. Take a cool bath. Try adding baking soda or dry oatmeal to the water. Do not bathe in hot water. Use calamine lotion on the rash or blisters. You can get this type of lotion at the store. Blister and rash care Keep your rash covered with a loose bandage. Wear loose clothes that don't rub on your rash. Take care of your rash as told by your provider. Make sure you: Wash your hands with soap and water for at least 20 seconds before and after you change your bandage. If you can't use soap and water, use hand sanitizer. Keep your rash and blisters clean by washing them with mild soap and cool water. Change your bandage. Check your rash every day for signs of infection. Check for: More redness, swelling, or pain. Fluid or blood. Warmth. Pus or a bad smell. Do not scratch your rash. Do not pick at your  blisters. To help you not scratch: Keep your fingernails clean and cut short. Try to wear gloves or mittens when you sleep. General instructions Rest. Wash your hands often with soap and water for at least 20 seconds. If you can't use soap and water, use hand sanitizer. Washing your hands lowers your chance of getting a skin infection. Your infection can cause chickenpox in others. If you have blisters that aren't scabs yet, stay away from: Babies. Pregnant people. Children who have eczema. Older people who have organ  transplants. People who have a long-term, or chronic, illness. Anyone who hasn't had chickenpox before. Anyone who hasn't gotten the chickenpox vaccine. How is this prevented? Vaccines are the best way to prevent you from getting chickenpox or shingles. Talk with your provider about getting these shots. Where to find more information Centers for Disease Control and Prevention (CDC): [employer]no Contact a health care provider if: Your pain doesn't get better with medicine. Your pain doesn't get better after the rash heals. You have any signs of infection around the rash. Your rash or blisters get worse. You have a fever or chills. Get help right away if: The rash is on your face or nose. You have pain in your face or by your eye. You lose feeling on one side of your face. You have trouble seeing. You have ear pain or ringing in your ear. This information is not intended to replace advice given to you by your health care provider. Make sure you discuss any questions you have with your health care provider. Document Revised: 10/08/2022 Document Reviewed: 02/20/2022 Elsevier Patient Education  2024 [employer].

## 2023-09-03 ENCOUNTER — Encounter: Payer: Self-pay | Admitting: Internal Medicine

## 2023-09-03 NOTE — Telephone Encounter (Signed)
 Yes, does need an appointment to discuss as notes will be needed to send along with the referral.

## 2023-09-07 ENCOUNTER — Ambulatory Visit: Admitting: Internal Medicine

## 2023-09-07 ENCOUNTER — Encounter: Payer: Self-pay | Admitting: Internal Medicine

## 2023-09-07 VITALS — BP 128/80 | HR 65 | Ht 71.0 in | Wt 223.5 lb

## 2023-09-07 DIAGNOSIS — M79671 Pain in right foot: Secondary | ICD-10-CM | POA: Diagnosis not present

## 2023-09-07 MED ORDER — MELOXICAM 15 MG PO TABS: 7.5000 mg | ORAL_TABLET | Freq: Every day | ORAL | 0 refills | Status: DC

## 2023-09-07 NOTE — Patient Instructions (Signed)
 Boney Growth on the Heel (Heel Spur): What to Know  A heel spur is a bony growth that forms on the bottom of the heel bone. This growth can cause pain and discomfort on the bottom of your foot near the heel. The pain can get worse when walking or standing. Heel spurs often form over time due to repeated stress on the heel bone and nearby tissues. What are the causes? The exact cause of heel spurs isn't known. They may be caused by: Pressure on the heel bone. Irritation of the strong tissues that connect the muscle to the heel bone. These strong tissues are called tendons. What increases the risk? You're more likely to get a heel spur if you: Are older than age 50. Are overweight. Have wear-and-tear arthritis, also called osteoarthritis. Have inflammation of the band of tissue that connects the toe bones to the heel bone (plantar fascia). Play sports or do activities that include a lot of running or jumping. Wear poorly fitted shoes. What are the signs or symptoms? Some people have no symptoms. If you do have symptoms, they may include: Pain in the bottom of your heel. Pain that's worse when you first get out of bed. Pain that gets worse after walking or standing. How is this diagnosed? This may be diagnosed based on: Your symptoms and medical history. A physical exam. A foot X-ray. How is this treated? Treatment depends on how much pain you have. Treatment options may include: Doing stretching exercises. Losing weight, if needed. Wearing specific shoes or shoe inserts called orthotics. Wearing splints on your feet while you sleep. Splints keep your feet in a position (usually a 90-degree angle) that can prevent and relieve pain when you get out of bed. Splints can make stretching easier in the morning. Taking medicine to relieve pain. This may include NSAIDs, such as ibuprofen. Using high-intensity sound waves to break up the heel spur. This is called extracorporeal shock wave  therapy. Getting steroid shots in your heel to lessen inflammation. Having surgery, if your heel spur causes long-term (chronic) pain. Follow these instructions at home:  Activity Avoid activities that cause pain until you recover, or for as long as told. Do stretching exercises. Stretch before exercising or being physically active. Managing pain, stiffness, and swelling Use ice or an ice pack as told. Place a towel between your skin and the ice. Leave the ice on for 20 minutes, 2-3 times a day. If your skin turns red, take off the ice right away to prevent skin damage. The risk of damage is higher if you can't feel pain, heat, or cold. Move your toes often. This can lessen stiffness and swelling. Raise your foot above the level of your heart while you're sitting or lying down. General instructions Take your medicines only as told. Wear supportive shoes that fit well. Wear splints, inserts, or orthotics as told. If recommended, work with your provider to lose weight. This can relieve pressure on your foot. Do not smoke, vape, or use nicotine or tobacco. Doing this can slow down healing. Where to find more information To learn more, go to: [employer] at [employer]aaos[employer]org Click "Search" and type "bone spurs." Find the link you need. Contact a health care provider if: Your pain doesn't go away with treatment. Your pain gets worse. This information is not intended to replace advice given to you by your health care provider. Make sure you discuss any questions you have with your health care provider.  Document Revised: 09/10/2022 Document Reviewed: 09/10/2022 Elsevier Patient Education  2024 [employer].

## 2023-09-07 NOTE — Progress Notes (Signed)
 Subjective:    Patient ID: Jerry Reid, male    DOB: January 17, 1976, 48 y.o.   MRN: [id number]  HPI  Discussed the use of AI scribe software for clinical note transcription with the patient, who gave verbal consent to proceed.  [name] is a 48 year old male who presents with right heel pain.  He has been experiencing right heel pain since mid-July, approximately one month ago. The pain is primarily located in the heel and is described as a 'toothache' sensation. It is worse in the morning and has been severe enough to cause him to walk on his toes to avoid pressure on the heel. He is significantly favoring the foot due to the discomfort.  The pain was exacerbated by wearing flat muck boots without support, leading him to discontinue his use. It does not radiate up the Achilles tendon or into the calf and remains localized to the heel. Prolonged standing, such as during a recent work trip to [place], significantly worsened the pain.  He has not been taking any regular medication for the pain but has occasionally used ibuprofen when the pain was at its worst. He has been using methods like rolling his foot over a tennis ball or ice water bottle to alleviate discomfort.   Review of Systems     Past Medical History:  Diagnosis Date   Chest pain    Chicken pox    Cholelithiasis    Diabetes mellitus without complication (HCC)    Dysplastic nevus    sees Dr. Garon [facility] [place]    HTN (hypertension)    Hyperlipidemia    Kidney stones     Current Outpatient Medications  Medication Sig Dispense Refill   fenofibrate  (TRICOR ) 145 MG tablet Take 1 tablet (145 mg total) by mouth daily. 90 tablet 1   gabapentin  (NEURONTIN ) 100 MG capsule Take 1 capsule (100 mg total) by mouth 3 (three) times daily. 30 capsule 0   glipiZIDE  (GLUCOTROL  XL) 5 MG 24 hr tablet Take 1 tablet (5 mg total) by mouth daily with breakfast. 90 tablet 0   lisinopril  (ZESTRIL ) 10 MG tablet Take 1 tablet (10 mg  total) by mouth daily. 90 tablet 1   rosuvastatin  (CRESTOR ) 10 MG tablet Take 1 tablet (10 mg total) by mouth daily. 90 tablet 1   sildenafil  (VIAGRA ) 50 MG tablet Take 1 tablet (50 mg total) by mouth daily as needed for erectile dysfunction. 10 tablet 5   tirzepatide  (MOUNJARO ) 7.5 MG/0.5ML Pen Inject 7.5 mg into the skin once a week. 6 mL 1   valACYclovir  (VALTREX ) 1000 MG tablet Take 1 tablet (1,000 mg total) by mouth 3 (three) times daily. For shingles 21 tablet 0   No current facility-administered medications for this visit.    Allergies  Allergen Reactions   Metformin  And Related Diarrhea    Family History  Problem Relation Age of Onset   Heart attack Mother    Heart disease Mother    Hyperlipidemia Mother    Hypertension Mother    Diabetes Mother    Hypertension Father    Heart disease Father    Hyperlipidemia Father    Diabetes Father    Prostate cancer Neg Hx    Colon cancer Neg Hx     Social History   Socioeconomic History   Marital status: Married    Spouse name: Not on file   Number of children: Not on file   Years of education: Not on file   Highest  education level: Not on file  Occupational History   Not on file  Tobacco Use   Smoking status: Never   Smokeless tobacco: Never  Vaping Use   Vaping status: Never Used  Substance and Sexual Activity   Alcohol use: No   Drug use: No   Sexual activity: Not on file  Other Topics Concern   Not on file  Social History Narrative   Works for the county    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   Social Drivers of Health   Financial Resource Strain: Low Risk  (12/30/2022)   Overall Financial Resource Strain (CARDIA)    Difficulty of Paying Living Expenses: Not hard at all  Food Insecurity: No Food Insecurity (12/30/2022)   Hunger Vital Sign    Worried About Running Out of Food in the Last Year: Never true    Ran Out of Food in the Last Year: Never true  Transportation Needs: No Transportation Needs  (12/30/2022)   PRAPARE - Administrator, Civil Service (Medical): No    Lack of Transportation (Non-Medical): No  Physical Activity: Inactive (12/30/2022)   Exercise Vital Sign    Days of Exercise per Week: 0 days    Minutes of Exercise per Session: 0 min  Stress: No Stress Concern Present (12/30/2022)   [employer] of Occupational Health - Occupational Stress Questionnaire    Feeling of Stress : Not at all  Social Connections: Moderately Integrated (12/30/2022)   Social Connection and Isolation Panel    Frequency of Communication with Friends and Family: Once a week    Frequency of Social Gatherings with Friends and Family: Once a week    Attends Religious Services: More than 4 times per year    Active Member of [employer] or Organizations: Yes    Attends Engineer, structural: More than 4 times per year    Marital Status: Married  Catering manager Violence: Not At Risk (12/30/2022)   Humiliation, Afraid, Rape, and Kick questionnaire    Fear of Current or Ex-Partner: No    Emotionally Abused: No    Physically Abused: No    Sexually Abused: No     Constitutional: Denies fever, malaise, fatigue, headache or abrupt weight changes.  Respiratory: Denies difficulty breathing, shortness of breath, cough or sputum production.   Cardiovascular: Denies chest pain, chest tightness, palpitations or swelling in the hands or feet.  Musculoskeletal: Patient reports heel pain.  Denies decrease in range of motion, difficulty with gait, muscle pain or joint swelling.  Skin: Denies redness, rashes, lesions or ulcercations.  Neurological: Denies numbness, tingling, weakness or problems with balance and coordination.    No other specific complaints in a complete review of systems (except as listed in HPI above).  Objective:   Physical Exam  BP 128/80 (BP Location: Left Arm, Patient Position: Sitting, Cuff Size: Small)   Pulse 65   Ht 5' 11 (1.803 m)   Wt 223 lb 8 oz  (101.4 kg)   SpO2 97%   BMI 31.17 kg/m     Wt Readings from Last 3 Encounters:  08/20/23 220 lb 12.8 oz (100.2 kg)  07/01/23 221 lb 9.6 oz (100.5 kg)  12/30/22 216 lb (98 kg)    General: Appears his stated age, obese, in NAD. Cardiovascular: Normal rate and rhythm. S1,S2 noted.  No murmur, rubs or gallops noted. No JVD or BLE edema.  Pulmonary/Chest: Normal effort and positive vesicular breath sounds. No respiratory distress. No wheezes, rales or  ronchi noted.  Musculoskeletal: Pain with palpation of the right heel.  No pain with palpation of the Achilles tendon.  No swelling noted.  No difficulty with gait.  Neurological: Alert and oriented.     BMET    Component Value Date/Time   NA 138 07/01/2023 0839   NA 139 07/19/2019 1005   K 4.0 07/01/2023 0839   CL 108 07/01/2023 0839   CO2 23 07/01/2023 0839   GLUCOSE 134 (H) 07/01/2023 0839   BUN 12 07/01/2023 0839   BUN 15 07/19/2019 1005   CREATININE 1.01 07/01/2023 0839   CALCIUM  9.3 07/01/2023 0839   GFRNONAA 96 06/28/2020 0756   GFRAA 111 06/28/2020 0756    Lipid Panel     Component Value Date/Time   CHOL 98 07/01/2023 0839   CHOL 150 06/18/2020 1259   TRIG 76 07/01/2023 0839   HDL 37 (L) 07/01/2023 0839   HDL 29 (L) 06/18/2020 1259   CHOLHDL 2.6 07/01/2023 0839   LDLCALC 45 07/01/2023 0839    CBC    Component Value Date/Time   WBC 5.9 07/01/2023 0839   RBC 4.86 07/01/2023 0839   HGB 14.7 07/01/2023 0839   HGB 16.9 03/23/2018 0935   HCT 43.8 07/01/2023 0839   HCT 47.4 03/23/2018 0935   PLT 243 07/01/2023 0839   PLT 231 03/23/2018 0935   MCV 90.1 07/01/2023 0839   MCV 84 03/23/2018 0935   MCH 30.2 07/01/2023 0839   MCHC 33.6 07/01/2023 0839   RDW 12.8 07/01/2023 0839   RDW 13.7 03/23/2018 0935   LYMPHSABS 1.3 03/23/2018 0935   EOSABS 0.0 03/23/2018 0935   BASOSABS 0.0 03/23/2018 0935    Hgb A1C Lab Results  Component Value Date   HGBA1C 5.4 07/01/2023            Assessment & Plan:    Assessment and Plan  Right heel pain 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 Symptoms align more with heel spur than plantar fasciitis. Discussed heel spur anatomy and its role in plantar fasciitis. Explained potential complications if untreated. - Order x-ray of right heel to assess for calcaneal spur. - Prescribe meloxicam  15 mg, start with half tablet daily, increase to full tablet if needed. Avoid additional NSAIDs, Tylenol  permissible. - Instruct on stretching exercises before getting out of bed and using a tennis ball or ice water bottle for foot rolling. - Discuss potential referral to podiatrist if symptoms persist or worsen.        RTC in 4 months, follow-up chronic conditions Angeline Laura, NP

## 2023-09-08 ENCOUNTER — Ambulatory Visit
Admission: RE | Admit: 2023-09-08 | Discharge: 2023-09-08 | Disposition: A | Discharge status: Home / Self Care | Source: Ambulatory Visit | Attending: Internal Medicine | Admitting: Internal Medicine

## 2023-09-08 ENCOUNTER — Ambulatory Visit: Payer: Self-pay | Admitting: Internal Medicine

## 2023-09-08 DIAGNOSIS — M79671 Pain in right foot: Secondary | ICD-10-CM | POA: Insufficient documentation

## 2023-10-02 ENCOUNTER — Other Ambulatory Visit: Payer: Self-pay | Admitting: Internal Medicine

## 2023-10-05 NOTE — Telephone Encounter (Signed)
 Requested by interface surescripts. Protocols met. Requested Prescriptions  Pending Prescriptions Disp Refills   glipiZIDE  (GLUCOTROL  XL) 5 MG 24 hr tablet [Pharmacy Med Name: GLIPIZIDE  ER 5 MG TABLET] 90 tablet 0    Sig: TAKE 1 TABLET BY MOUTH EVERY DAY WITH BREAKFAST     Endocrinology:  Diabetes - Sulfonylureas Passed - 10/05/2023  8:43 AM      Passed - HBA1C is between 0 and 7.9 and within 180 days    Hgb A1c MFr Bld  Date Value Ref Range Status  07/01/2023 5.4 <5.7 % Final    Comment:    For the purpose of screening for the presence of diabetes: . <5.7%       Consistent with the absence of diabetes 5.7-6.4%    Consistent with increased risk for diabetes             (prediabetes) > or =6.5%  Consistent with diabetes . This assay result is consistent with a decreased risk of diabetes. . Currently, no consensus exists regarding use of hemoglobin A1c for diagnosis of diabetes in children. . According to American Diabetes Association (ADA) guidelines, hemoglobin A1c <7.0% represents optimal control in non-pregnant diabetic patients. Different metrics may apply to specific patient populations.  Standards of Medical Care in Diabetes(ADA). .          Passed - Cr in normal range and within 360 days    Creat  Date Value Ref Range Status  07/01/2023 1.01 0.70 - 1.30 mg/dL Final   Creatinine, Urine  Date Value Ref Range Status  07/01/2023 250 20 - 320 mg/dL Final         Passed - Valid encounter within last 6 months    Recent Outpatient Visits           4 weeks ago Pain of right heel   [facility] [place], [place] W, NP   1 month ago Herpes zoster without complication   [place] [facility] [place], Angeline ORN, NP   3 months ago Encounter for general adult medical examination with abnormal findings   [place] [facility] [place], Angeline ORN, NP

## 2023-10-06 ENCOUNTER — Other Ambulatory Visit: Payer: Self-pay | Admitting: Internal Medicine

## 2023-10-07 NOTE — Telephone Encounter (Signed)
 Requested Prescriptions  Pending Prescriptions Disp Refills   fenofibrate  (TRICOR ) 145 MG tablet [Pharmacy Med Name: FENOFIBRATE  145 MG TABLET] 90 tablet 2    Sig: TAKE 1 TABLET BY MOUTH EVERY DAY     Cardiovascular:  Antilipid - Fibric Acid Derivatives Failed - 10/07/2023 11:15 AM      Failed - Lipid Panel in normal range within the last 12 months    Cholesterol, Total  Date Value Ref Range Status  06/18/2020 150 100 - 199 mg/dL Final   Cholesterol  Date Value Ref Range Status  07/01/2023 98 <200 mg/dL Final   LDL Cholesterol (Calc)  Date Value Ref Range Status  07/01/2023 45 mg/dL (calc) Final    Comment:    Reference range: <100 . Desirable range <100 mg/dL for primary prevention;   <70 mg/dL for patients with CHD or diabetic patients  with > or = 2 CHD risk factors. SABRA LDL-C is now calculated using the Martin-Hopkins  calculation, which is a validated novel method providing  better accuracy than the Friedewald equation in the  estimation of LDL-C.  Gladis APPLETHWAITE et al. SANDREA. 7986;689(80): 2061-2068  (http://education.[facility].com/faq/FAQ164)    Direct LDL  Date Value Ref Range Status  01/04/2020 119.0 mg/dL Final    Comment:    Optimal:  <100 mg/dLNear or Above Optimal:  100-129 mg/dLBorderline High:  130-159 mg/dLHigh:  160-189 mg/dLVery High:  >190 mg/dL   HDL  Date Value Ref Range Status  07/01/2023 37 (L) > OR = 40 mg/dL Final  94/68/7977 29 (L) >39 mg/dL Final   Triglycerides  Date Value Ref Range Status  07/01/2023 76 <150 mg/dL Final         Passed - ALT in normal range and within 360 days    ALT  Date Value Ref Range Status  07/01/2023 24 9 - 46 U/L Final         Passed - AST in normal range and within 360 days    AST  Date Value Ref Range Status  07/01/2023 16 10 - 35 U/L Final         Passed - Cr in normal range and within 360 days    Creat  Date Value Ref Range Status  07/01/2023 1.01 0.70 - 1.30 mg/dL Final   Creatinine, Urine   Date Value Ref Range Status  07/01/2023 250 20 - 320 mg/dL Final         Passed - HGB in normal range and within 360 days    Hemoglobin  Date Value Ref Range Status  07/01/2023 14.7 13.2 - 17.1 g/dL Final  96/95/7979 83.0 13.0 - 17.7 g/dL Final         Passed - HCT in normal range and within 360 days    HCT  Date Value Ref Range Status  07/01/2023 43.8 38.5 - 50.0 % Final   Hematocrit  Date Value Ref Range Status  03/23/2018 47.4 37.5 - 51.0 % Final         Passed - PLT in normal range and within 360 days    Platelets  Date Value Ref Range Status  07/01/2023 243 140 - 400 Thousand/uL Final  03/23/2018 231 150 - 450 x10E3/uL Final         Passed - WBC in normal range and within 360 days    WBC  Date Value Ref Range Status  07/01/2023 5.9 3.8 - 10.8 Thousand/uL Final         Passed - eGFR is 30 or  above and within 360 days    GFR, Est African American  Date Value Ref Range Status  06/28/2020 111 > OR = 60 mL/min/1.28m2 Final   GFR, Est Non African American  Date Value Ref Range Status  06/28/2020 96 > OR = 60 mL/min/1.40m2 Final   GFR  Date Value Ref Range Status  11/17/2018 80.55 >60.00 mL/min Final   eGFR  Date Value Ref Range Status  07/01/2023 91 > OR = 60 mL/min/1.31m2 Final         Passed - Valid encounter within last 12 months    Recent Outpatient Visits           1 month ago Pain of right heel   [facility] [place], [place] W, NP   1 month ago Herpes zoster without complication   [facility] , Angeline ORN, NP   3 months ago Encounter for general adult medical examination with abnormal findings   [facility] [place], Angeline ORN, NP

## 2023-11-25 ENCOUNTER — Encounter: Payer: Self-pay | Admitting: Internal Medicine

## 2023-11-25 MED ORDER — TIRZEPATIDE 10 MG/0.5ML ~~LOC~~ SOAJ: 10.0000 mg | SUBCUTANEOUS | 1 refills | Status: AC

## 2023-12-04 ENCOUNTER — Other Ambulatory Visit: Payer: Self-pay | Admitting: Internal Medicine

## 2023-12-07 NOTE — Telephone Encounter (Signed)
 Requested Prescriptions  Pending Prescriptions Disp Refills   meloxicam  (MOBIC ) 15 MG tablet [Pharmacy Med Name: MELOXICAM  15 MG TABLET] 90 tablet 0    Sig: TAKE 1/2 TO 1 TABLET (7.5-15 MG TOTAL) BY MOUTH DAILY     Analgesics:  COX2 Inhibitors Failed - 12/07/2023 11:56 AM      Failed - Manual Review: Labs are only required if the patient has taken medication for more than 8 weeks.      Passed - HGB in normal range and within 360 days    Hemoglobin  Date Value Ref Range Status  07/01/2023 14.7 13.2 - 17.1 g/dL Final  96/95/7979 83.0 13.0 - 17.7 g/dL Final         Passed - Cr in normal range and within 360 days    Creat  Date Value Ref Range Status  07/01/2023 1.01 0.70 - 1.30 mg/dL Final   Creatinine, Urine  Date Value Ref Range Status  07/01/2023 250 20 - 320 mg/dL Final         Passed - HCT in normal range and within 360 days    HCT  Date Value Ref Range Status  07/01/2023 43.8 38.5 - 50.0 % Final   Hematocrit  Date Value Ref Range Status  03/23/2018 47.4 37.5 - 51.0 % Final         Passed - AST in normal range and within 360 days    AST  Date Value Ref Range Status  07/01/2023 16 10 - 35 U/L Final         Passed - ALT in normal range and within 360 days    ALT  Date Value Ref Range Status  07/01/2023 24 9 - 46 U/L Final         Passed - eGFR is 30 or above and within 360 days    GFR, Est African American  Date Value Ref Range Status  06/28/2020 111 > OR = 60 mL/min/1.58m2 Final   GFR, Est Non African American  Date Value Ref Range Status  06/28/2020 96 > OR = 60 mL/min/1.50m2 Final   GFR  Date Value Ref Range Status  11/17/2018 80.55 >60.00 mL/min Final   eGFR  Date Value Ref Range Status  07/01/2023 91 > OR = 60 mL/min/1.21m2 Final         Passed - Patient is not pregnant      Passed - Valid encounter within last 12 months    Recent Outpatient Visits           3 months ago Pain of right heel   [facility] [place],  Angeline ORN, NP   3 months ago Herpes zoster without complication   [facility] [place], Angeline ORN, NP   5 months ago Encounter for general adult medical examination with abnormal findings    [facility] [place], Angeline ORN, NP

## 2023-12-30 ENCOUNTER — Other Ambulatory Visit: Payer: Self-pay | Admitting: Internal Medicine

## 2024-01-03 ENCOUNTER — Other Ambulatory Visit: Payer: Self-pay | Admitting: Internal Medicine

## 2024-01-03 NOTE — Progress Notes (Unsigned)
 Subjective:    Patient ID: Jerry Reid, male    DOB: 12/11/1975, 48 y.o.   MRN: [id number]  HPI  Patient presents to clinic today for 60-month follow-up of chronic conditions.  DM2: His last A1c was 5.4%, 06/2023.  He is taking mounjaro , glipizide  as prescribed. He is no longer taking metformin  or jardiance . He does not check his sugars.  He checks his feet routinely.  His last eye exam was 12/2022.  Flu 12/2022.  Pneumovax 12/2021. Prevnar never.  COVID Pfizer x 2.  HTN: Associated with diabetes.  His BP today is 128/84.  He is taking lisinopril  as prescribed.  ECG from 07/2010 reviewed.  HLD: Associated with diabetes.  His last LDL was 45, triglycerides 76, 06/2023.  He denies myalgias on rosuvastatin  and fenofibrate .  He does not consume low-fat diet.  OA, right shoulder: Pain seems to have resolved. He takes meloxicam  as prescribed.  He follows with orthopedics.  Plantar fasciitis: This has improved.  Managed with meloxicam . He does not follow with podiatry.  ED: He is having difficulty maintaining erections. He is taking sildenafil  as prescribed.  He does not currently follow with urology.  GERD: Triggered by greasy foods. He is taking tums 3-4 times per week. There is no upper GI on file.  Review of Systems     Past Medical History:  Diagnosis Date   Chest pain    Chicken pox    Cholelithiasis    Diabetes mellitus without complication (HCC)    Dysplastic nevus    sees Dr. Garon PAC [place]    HTN (hypertension)    Hyperlipidemia    Kidney stones     Current Outpatient Medications  Medication Sig Dispense Refill   fenofibrate  (TRICOR ) 145 MG tablet TAKE 1 TABLET BY MOUTH EVERY DAY 90 tablet 2   gabapentin  (NEURONTIN ) 100 MG capsule Take 1 capsule (100 mg total) by mouth 3 (three) times daily. 30 capsule 0   glipiZIDE  (GLUCOTROL  XL) 5 MG 24 hr tablet TAKE 1 TABLET BY MOUTH EVERY DAY WITH BREAKFAST 90 tablet 0   lisinopril  (ZESTRIL ) 10 MG tablet Take 1 tablet (10 mg  total) by mouth daily. 90 tablet 1   meloxicam  (MOBIC ) 15 MG tablet TAKE 1/2 TO 1 TABLET (7.5-15 MG TOTAL) BY MOUTH DAILY 90 tablet 0   rosuvastatin  (CRESTOR ) 10 MG tablet Take 1 tablet (10 mg total) by mouth daily. 90 tablet 1   sildenafil  (VIAGRA ) 50 MG tablet Take 1 tablet (50 mg total) by mouth daily as needed for erectile dysfunction. 10 tablet 5   tirzepatide  (MOUNJARO ) 10 MG/0.5ML Pen Inject 10 mg into the skin once a week. 6 mL 1   tirzepatide  (MOUNJARO ) 7.5 MG/0.5ML Pen Inject 7.5 mg into the skin once a week. 6 mL 1   valACYclovir  (VALTREX ) 1000 MG tablet Take 1 tablet (1,000 mg total) by mouth 3 (three) times daily. For shingles 21 tablet 0   No current facility-administered medications for this visit.    Allergies  Allergen Reactions   Metformin  And Related Diarrhea    Family History  Problem Relation Age of Onset   Heart attack Mother    Heart disease Mother    Hyperlipidemia Mother    Hypertension Mother    Diabetes Mother    Hypertension Father    Heart disease Father    Hyperlipidemia Father    Diabetes Father    Prostate cancer Neg Hx    Colon cancer Neg Hx  Social History   Socioeconomic History   Marital status: Married    Spouse name: Not on file   Number of children: Not on file   Years of education: Not on file   Highest education level: Not on file  Occupational History   Not on file  Tobacco Use   Smoking status: Never   Smokeless tobacco: Never  Vaping Use   Vaping status: Never Used  Substance and Sexual Activity   Alcohol use: No   Drug use: No   Sexual activity: Not on file  Other Topics Concern   Not on file  Social History Narrative   Works for the county    Married 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 Magazine Features Editor   Social Drivers of Health   Tobacco Use: Low Risk (09/07/2023)   Patient History    Smoking Tobacco Use: Never    Smokeless Tobacco Use: Never    Passive Exposure: Not on file  Financial Resource Strain: Low Risk (12/30/2022)    Overall Financial Resource Strain (CARDIA)    Difficulty of Paying Living Expenses: Not hard at all  Food Insecurity: No Food Insecurity (12/30/2022)   Hunger Vital Sign    Worried About Running Out of Food in the Last Year: Never true    Ran Out of Food in the Last Year: Never true  Transportation Needs: No Transportation Needs (12/30/2022)   PRAPARE - Administrator, Civil Service (Medical): No    Lack of Transportation (Non-Medical): No  Physical Activity: Inactive (12/30/2022)   Exercise Vital Sign    Days of Exercise per Week: 0 days    Minutes of Exercise per Session: 0 min  Stress: No Stress Concern Present (12/30/2022)   [employer] of Occupational Health - Occupational Stress Questionnaire    Feeling of Stress : Not at all  Social Connections: Moderately Integrated (12/30/2022)   Social Connection and Isolation Panel    Frequency of Communication with Friends and Family: Once a week    Frequency of Social Gatherings with Friends and Family: Once a week    Attends Religious Services: More than 4 times per year    Active Member of [employer] or Organizations: Yes    Attends Banker Meetings: More than 4 times per year    Marital Status: Married  Catering Manager Violence: Not At Risk (12/30/2022)   Humiliation, Afraid, Rape, and Kick questionnaire    Fear of Current or Ex-Partner: No    Emotionally Abused: No    Physically Abused: No    Sexually Abused: No  Depression (PHQ2-9): Low Risk (09/07/2023)   Depression (PHQ2-9)    PHQ-2 Score: 0  Alcohol Screen: Low Risk (06/23/2021)   Alcohol Screen    Last Alcohol Screening Score (AUDIT): 0  Housing: Low Risk (12/30/2022)   Housing    Last Housing Risk Score: 0  Utilities: Not At Risk (12/30/2022)   AHC Utilities    Threatened with loss of utilities: No  Health Literacy: Adequate Health Literacy (12/30/2022)   B1300 Health Literacy    Frequency of need for help with medical instructions: Never      Constitutional: Denies fever, malaise, fatigue, headache or abrupt weight changes.  HEENT: Denies eye pain, eye redness, ear pain, ringing in the ears, wax buildup, runny nose, nasal congestion, bloody nose, or sore throat. Respiratory: Denies difficulty breathing, shortness of breath, cough or sputum production.   Cardiovascular: Denies chest pain, chest tightness, palpitations or swelling in the hands or feet.  Gastrointestinal: Pt reports intermittent nausea, diarrhea, reflux. Denies abdominal pain, bloating, constipation, or blood in the stool.  GU: Patient reports erectile dysfunction.  Denies urgency, frequency, pain with urination, burning sensation, blood in urine, odor or discharge. Musculoskeletal: Denies difficulty with gait, muscle pain or joint swelling.  Skin: Denies redness, rashes, lesions or ulcercations.  Neurological: Denies dizziness, difficulty with memory, difficulty with speech or problems with balance and coordination.  Psych: Denies anxiety, depression, SI/HI.  No other specific complaints in a complete review of systems (except as listed in HPI above).  Objective:   Physical Exam  BP 128/84 (BP Location: Left Arm, Patient Position: Sitting, Cuff Size: Large)   Ht 5' 11 (1.803 m)   Wt 224 lb 3.2 oz (101.7 kg)   BMI 31.27 kg/m     Wt Readings from Last 3 Encounters:  09/07/23 223 lb 8 oz (101.4 kg)  08/20/23 220 lb 12.8 oz (100.2 kg)  07/01/23 221 lb 9.6 oz (100.5 kg)    General: Appears his stated age, obese in NAD. Skin: Warm, dry and intact. No ulcerations noted. HEENT: Head: normal shape and size; Eyes: sclera white, no icterus, conjunctiva pink, PERRLA and EOMs intact;  Cardiovascular: Normal rate and rhythm. S1,S2 noted.  No murmur, rubs or gallops noted. No JVD or BLE edema. No carotid bruits noted. Pulmonary/Chest: Normal effort and positive vesicular breath sounds. No respiratory distress. No wheezes, rales or ronchi noted.   Musculoskeletal: No difficulty with gait.  Neurological: Alert and oriented. Cranial nerves II-XII grossly intact. Coordination normal.  Psychiatric: Mood and affect normal. Behavior is normal. Judgment and thought content normal.    BMET    Component Value Date/Time   NA 138 07/01/2023 0839   NA 139 07/19/2019 1005   K 4.0 07/01/2023 0839   CL 108 07/01/2023 0839   CO2 23 07/01/2023 0839   GLUCOSE 134 (H) 07/01/2023 0839   BUN 12 07/01/2023 0839   BUN 15 07/19/2019 1005   CREATININE 1.01 07/01/2023 0839   CALCIUM  9.3 07/01/2023 0839   GFRNONAA 96 06/28/2020 0756   GFRAA 111 06/28/2020 0756    Lipid Panel     Component Value Date/Time   CHOL 98 07/01/2023 0839   CHOL 150 06/18/2020 1259   TRIG 76 07/01/2023 0839   HDL 37 (L) 07/01/2023 0839   HDL 29 (L) 06/18/2020 1259   CHOLHDL 2.6 07/01/2023 0839   LDLCALC 45 07/01/2023 0839    CBC    Component Value Date/Time   WBC 5.9 07/01/2023 0839   RBC 4.86 07/01/2023 0839   HGB 14.7 07/01/2023 0839   HGB 16.9 03/23/2018 0935   HCT 43.8 07/01/2023 0839   HCT 47.4 03/23/2018 0935   PLT 243 07/01/2023 0839   PLT 231 03/23/2018 0935   MCV 90.1 07/01/2023 0839   MCV 84 03/23/2018 0935   MCH 30.2 07/01/2023 0839   MCHC 33.6 07/01/2023 0839   RDW 12.8 07/01/2023 0839   RDW 13.7 03/23/2018 0935   LYMPHSABS 1.3 03/23/2018 0935   EOSABS 0.0 03/23/2018 0935   BASOSABS 0.0 03/23/2018 0935    Hgb A1C Lab Results  Component Value Date   HGBA1C 5.4 07/01/2023            Assessment & Plan:   RTC in 6 months for your annual exam Angeline Laura, NP

## 2024-01-03 NOTE — Telephone Encounter (Signed)
 Requested medications are due for refill today.  yes  Requested medications are on the active medications list.  yes  Last refill. 10/05/2023 #90 0 rf  Future visit scheduled.   yes  Notes to clinic.  Labs are expired.    Requested Prescriptions  Pending Prescriptions Disp Refills   glipiZIDE  (GLUCOTROL  XL) 5 MG 24 hr tablet [Pharmacy Med Name: GLIPIZIDE  ER 5 MG TABLET] 90 tablet 0    Sig: TAKE 1 TABLET BY MOUTH EVERY DAY WITH BREAKFAST     Endocrinology:  Diabetes - Sulfonylureas Failed - 01/03/2024 11:02 AM      Failed - HBA1C is between 0 and 7.9 and within 180 days    Hgb A1c MFr Bld  Date Value Ref Range Status  07/01/2023 5.4 <5.7 % Final    Comment:    For the purpose of screening for the presence of diabetes: . <5.7%       Consistent with the absence of diabetes 5.7-6.4%    Consistent with increased risk for diabetes             (prediabetes) > or =6.5%  Consistent with diabetes . This assay result is consistent with a decreased risk of diabetes. . Currently, no consensus exists regarding use of hemoglobin A1c for diagnosis of diabetes in children. . According to American Diabetes Association (ADA) guidelines, hemoglobin A1c <7.0% represents optimal control in non-pregnant diabetic patients. Different metrics may apply to specific patient populations.  Standards of Medical Care in Diabetes(ADA). .          Passed - Cr in normal range and within 360 days    Creat  Date Value Ref Range Status  07/01/2023 1.01 0.70 - 1.30 mg/dL Final   Creatinine, Urine  Date Value Ref Range Status  07/01/2023 250 20 - 320 mg/dL Final         Passed - Valid encounter within last 6 months    Recent Outpatient Visits           3 months ago Pain of right heel   [place] [facility] [place], [place] W, NP   4 months ago Herpes zoster without complication   [facility] [place], Angeline ORN, NP   6 months ago Encounter for general  adult medical examination with abnormal findings   [place] [facility] [place], Angeline ORN, NP

## 2024-01-04 ENCOUNTER — Encounter: Payer: Self-pay | Admitting: Internal Medicine

## 2024-01-04 ENCOUNTER — Ambulatory Visit: Admitting: Internal Medicine

## 2024-01-04 VITALS — BP 128/84 | Ht 71.0 in | Wt 224.2 lb

## 2024-01-04 DIAGNOSIS — E1165 Type 2 diabetes mellitus with hyperglycemia: Secondary | ICD-10-CM

## 2024-01-04 DIAGNOSIS — K219 Gastro-esophageal reflux disease without esophagitis: Secondary | ICD-10-CM

## 2024-01-04 DIAGNOSIS — E6609 Other obesity due to excess calories: Secondary | ICD-10-CM

## 2024-01-04 DIAGNOSIS — I152 Hypertension secondary to endocrine disorders: Secondary | ICD-10-CM

## 2024-01-04 DIAGNOSIS — M722 Plantar fascial fibromatosis: Secondary | ICD-10-CM | POA: Insufficient documentation

## 2024-01-04 DIAGNOSIS — Z23 Encounter for immunization: Secondary | ICD-10-CM

## 2024-01-04 DIAGNOSIS — E1169 Type 2 diabetes mellitus with other specified complication: Secondary | ICD-10-CM

## 2024-01-04 DIAGNOSIS — N522 Drug-induced erectile dysfunction: Secondary | ICD-10-CM

## 2024-01-04 DIAGNOSIS — G8929 Other chronic pain: Secondary | ICD-10-CM

## 2024-01-04 MED ORDER — LISINOPRIL 10 MG PO TABS: 10.0000 mg | ORAL_TABLET | Freq: Every day | ORAL | 1 refills | Status: AC

## 2024-01-04 MED ORDER — OMEPRAZOLE 20 MG PO CPDR: 20.0000 mg | DELAYED_RELEASE_CAPSULE | Freq: Every day | ORAL | 1 refills | Status: DC

## 2024-01-04 MED ORDER — MELOXICAM 15 MG PO TABS: 15.0000 mg | ORAL_TABLET | Freq: Every day | ORAL | 1 refills | Status: AC

## 2024-01-04 NOTE — Assessment & Plan Note (Signed)
 Continue meloxicam  15 mg daily C-Met today We will monitor

## 2024-01-04 NOTE — Assessment & Plan Note (Signed)
 Continue meloxicam  15 mg daily C-Met today Will monitor

## 2024-01-04 NOTE — Assessment & Plan Note (Signed)
 Complicated by obesity Controlled on lisinopril  10 mg daily Reinforced DASH diet and exercise for weight loss C-Met today

## 2024-01-04 NOTE — Assessment & Plan Note (Signed)
 Encouraged diet and exercise for weight loss ?

## 2024-01-04 NOTE — Assessment & Plan Note (Signed)
 A1c today Urine microalbumin has been checked within the last year Encouraged her to consume a low-carb diet and exercise for weight loss Continue  glipizide  5 mg XL daily but consider reduction in oral medication pending A1c Continue tirzepatide  10 mg weekly Encouraged routine eye exam Encouraged routine foot exam Flu shot today Pneumovax UTD Prevnar 20 today Encouraged him to get his COVID booster

## 2024-01-04 NOTE — Assessment & Plan Note (Signed)
 Complicated by obesity Encourage weight loss as this can help reduce reflux symptoms Will start omeprazole  20 mg daily Okay to continue tums OTC as needed

## 2024-01-04 NOTE — Patient Instructions (Signed)
 GERD in Adults: Diet Changes When you have gastroesophageal reflux disease (GERD), you may need to make changes to your diet. Choosing the right foods can help with your symptoms. Think about working with an expert in healthy eating called a dietitian. They can help you make healthy food choices. What are tips for following this plan? Reading food labels Look for foods that are low in saturated fat. Foods that may help with your symptoms include: Foods with less than 5% of daily value (DV) of fat. Foods with 0 grams of trans fat. Cooking [employer] in ways that don't use a lot of fat. These ways include: Baking. Steaming. Grilling. Broiling. To add flavor, try to use herbs that are low in spice and acidity. Avoid frying your food. Meal planning  Eat small meals often rather than eating 3 large meals each day. Eat your meals slowly in a place where you feel relaxed. If told by your health care provider, avoid: Foods that cause symptoms. Keep a food diary to keep track of foods that cause symptoms. Alcohol. Drinking a lot of liquid with meals. General instructions For 2-3 hours after you eat, avoid: Bending over. Exercise. Lying down. Chew sugar-free gum after meals. What foods should I eat? Eat a healthy diet. Try to include: Foods with high amounts of fiber. These include: Fruits and vegetables. Whole grains and beans. Low-fat dairy products. Lean meats, fish, and poultry. Egg whites. Foods that cause symptoms in someone else may not cause symptoms for you. Work with your provider to find foods that are safe for you. The items listed above may not be all the foods and drinks you can have. Talk with a dietitian to learn more. The items listed above may not be a complete list of foods and beverages you can eat and drink. Contact a dietitian for more information. What foods should I avoid? Limiting some of these foods may help with your symptoms. Each person is different.  Talk with a dietitian or your provider to help you find the exact foods to avoid. Some of the foods to avoid may include: Fruits Fruits with a lot of acid in them. These may include citrus fruits, such as oranges, grapefruit, pineapple, and lemons. Vegetables Deep-fried vegetables, such as [place] fries. Vegetables, sauces, or toppings made with added fat and vegetables with acid in them. These may include tomatoes and tomato products, chili peppers, onions, garlic, and horseradish. Grains Pastries or quick breads with added fat. Meats and other proteins High-fat meats, such as fatty beef or pork, hot dogs, ribs, ham, sausage, salami, and bacon. Fried meat or protein, such as fried fish and fried chicken. Egg yolks. Fats and oils Butter. Margarine. Shortening. Ghee. Drinks Coffee and other drinks with caffeine in them. Fizzy and sugary drinks, such as soda and energy drinks. Fruit juice made with acidic fruits, such as orange or grapefruit. Tomato juice. Sweets and desserts Chocolate and cocoa. Donuts. Seasonings and condiments Mint, such as peppermint and spearmint. Condiments, herbs, or seasonings that cause symptoms. These may include curry, hot sauce, or vinegar-based salad dressings. The items listed above may not be all the foods and drinks you should avoid. Talk with a dietitian to learn more. Questions to ask your health care provider Changes to your diet and everyday life are often the first steps taken to manage symptoms of GERD. If these changes don't help, talk with your provider about taking medicines. Where to find more information International Foundation for Gastrointestinal Disorders:  aboutgerd.org This information is not intended to replace advice given to you by your health care provider. Make sure you discuss any questions you have with your health care provider. Document Revised: 11/17/2022 Document Reviewed: 06/03/2022 Elsevier Patient Education  2024 [employer].

## 2024-01-04 NOTE — Assessment & Plan Note (Signed)
 Complicated by obesity C-Met and lipid profile today Encouraged him to consume a low-fat diet Continue rosuvastatin  10 mg and fenofibrate  145 mcg daily

## 2024-01-04 NOTE — Assessment & Plan Note (Signed)
Continue sildenafil 50 mg daily as needed.

## 2024-01-05 ENCOUNTER — Ambulatory Visit: Payer: Self-pay | Admitting: Internal Medicine

## 2024-01-05 LAB — CBC
HCT: 47 % (ref 39.4–51.1)
Hemoglobin: 15.9 g/dL (ref 13.2–17.1)
MCH: 29.9 pg (ref 27.0–33.0)
MCHC: 33.8 g/dL (ref 31.6–35.4)
MCV: 88.5 fL (ref 81.4–101.7)
MPV: 9.1 fL (ref 7.5–12.5)
Platelets: 286 Thousand/uL (ref 140–400)
RBC: 5.31 Million/uL (ref 4.20–5.80)
RDW: 13.1 % (ref 11.0–15.0)
WBC: 7 Thousand/uL (ref 3.8–10.8)

## 2024-01-05 LAB — COMPREHENSIVE METABOLIC PANEL WITH GFR
AG Ratio: 2 (calc) (ref 1.0–2.5)
ALT: 30 U/L (ref 9–46)
AST: 20 U/L (ref 10–35)
Albumin: 4.8 g/dL (ref 3.6–5.1)
Alkaline phosphatase (APISO): 28 U/L — ABNORMAL LOW (ref 35–144)
BUN: 12 mg/dL (ref 7–25)
CO2: 28 mmol/L (ref 20–32)
Calcium: 9.9 mg/dL (ref 8.6–10.3)
Chloride: 103 mmol/L (ref 98–110)
Creat: 1.06 mg/dL (ref 0.70–1.30)
Globulin: 2.4 g/dL (ref 1.9–3.7)
Glucose, Bld: 129 mg/dL — ABNORMAL HIGH (ref 65–99)
Potassium: 4.3 mmol/L (ref 3.5–5.3)
Sodium: 137 mmol/L (ref 135–146)
Total Bilirubin: 1.2 mg/dL (ref 0.2–1.2)
Total Protein: 7.2 g/dL (ref 6.1–8.1)
eGFR: 85 mL/min/1.73m2 (ref >=60)

## 2024-01-05 LAB — HEMOGLOBIN A1C
Hgb A1c MFr Bld: 5.3 % (ref <5.7)
Mean Plasma Glucose: 105 mg/dL
eAG (mmol/L): 5.8 mmol/L

## 2024-01-05 LAB — LIPID PANEL
Cholesterol: 89 mg/dL (ref <200)
HDL: 36 mg/dL — ABNORMAL LOW (ref >=40)
LDL Cholesterol (Calc): 33 mg/dL
Non-HDL Cholesterol (Calc): 53 mg/dL (ref <130)
Total CHOL/HDL Ratio: 2.5 (calc) (ref <5.0)
Triglycerides: 113 mg/dL (ref <150)

## 2024-01-05 NOTE — Telephone Encounter (Signed)
 Requested Prescriptions  Pending Prescriptions Disp Refills   rosuvastatin  (CRESTOR ) 10 MG tablet [Pharmacy Med Name: ROSUVASTATIN  CALCIUM  10 MG TAB] 90 tablet 2    Sig: TAKE 1 TABLET BY MOUTH EVERY DAY     Cardiovascular:  Antilipid - Statins 2 Failed - 01/05/2024 11:13 AM      Failed - Lipid Panel in normal range within the last 12 months    Cholesterol, Total  Date Value Ref Range Status  06/18/2020 150 100 - 199 mg/dL Final   Cholesterol  Date Value Ref Range Status  01/04/2024 89 <200 mg/dL Final   LDL Cholesterol (Calc)  Date Value Ref Range Status  01/04/2024 33 mg/dL (calc) Final    Comment:    Reference range: <100 . Desirable range <100 mg/dL for primary prevention;   <70 mg/dL for patients with CHD or diabetic patients  with > or = 2 CHD risk factors. SABRA LDL-C is now calculated using the Martin-Hopkins  calculation, which is a validated novel method providing  better accuracy than the Friedewald equation in the  estimation of LDL-C.  Gladis APPLETHWAITE et al. SANDREA. 7986;689(80): 2061-2068  (http://education.QuestDiagnostics.com/faq/FAQ164)    Direct LDL  Date Value Ref Range Status  01/04/2020 119.0 mg/dL Final    Comment:    Optimal:  <100 mg/dLNear or Above Optimal:  100-129 mg/dLBorderline High:  130-159 mg/dLHigh:  160-189 mg/dLVery High:  >190 mg/dL   HDL  Date Value Ref Range Status  01/04/2024 36 (L) > OR = 40 mg/dL Final  94/68/7977 29 (L) >39 mg/dL Final   Triglycerides  Date Value Ref Range Status  01/04/2024 113 <150 mg/dL Final         Passed - Cr in normal range and within 360 days    Creat  Date Value Ref Range Status  01/04/2024 1.06 0.70 - 1.30 mg/dL Final   Creatinine, Urine  Date Value Ref Range Status  07/01/2023 250 20 - 320 mg/dL Final         Passed - Patient is not pregnant      Passed - Valid encounter within last 12 months    Recent Outpatient Visits           Yesterday Type 2 diabetes mellitus with hyperglycemia, without  long-term current use of insulin [facility])   [facility] [place], Angeline ORN, NP   4 months ago Pain of right heel   [place] [facility] [place], Angeline ORN, NP   4 months ago Herpes zoster without complication   [place] [facility] [place], Angeline ORN, NP   6 months ago Encounter for general adult medical examination with abnormal findings   [place] [facility] [place] [facility] [place], Angeline ORN, NP

## 2024-01-10 LAB — OPHTHALMOLOGY REPORT-SCANNED

## 2024-01-27 ENCOUNTER — Encounter: Payer: Self-pay | Admitting: Internal Medicine

## 2024-02-17 ENCOUNTER — Encounter: Payer: Self-pay | Admitting: Internal Medicine

## 2024-02-18 NOTE — Telephone Encounter (Signed)
 Was this patient put on the nurse schedule for shingles vaccine?

## 2024-02-23 ENCOUNTER — Other Ambulatory Visit: Payer: Self-pay | Admitting: Internal Medicine

## 2024-02-23 ENCOUNTER — Ambulatory Visit

## 2024-02-23 DIAGNOSIS — Z23 Encounter for immunization: Secondary | ICD-10-CM | POA: Diagnosis not present

## 2024-02-23 MED ORDER — PANTOPRAZOLE SODIUM 40 MG PO TBEC: 40.0000 mg | DELAYED_RELEASE_TABLET | Freq: Two times a day (BID) | ORAL | 0 refills | Status: AC

## 2024-07-04 ENCOUNTER — Encounter: Admitting: Internal Medicine
